# Patient Record
Sex: Male | Born: 1965 | Race: Black or African American | Hispanic: No | Marital: Single | Smoking: Former smoker
Health system: Southern US, Community
[De-identification: ages and names within clinical notes are randomized; demographics above are authoritative.]

## PROBLEM LIST (undated history)

## (undated) DIAGNOSIS — Y249XXA Unspecified firearm discharge, undetermined intent, initial encounter: Secondary | ICD-10-CM

## (undated) DIAGNOSIS — W3400XA Accidental discharge from unspecified firearms or gun, initial encounter: Secondary | ICD-10-CM

## (undated) DIAGNOSIS — M1990 Unspecified osteoarthritis, unspecified site: Secondary | ICD-10-CM

## (undated) HISTORY — DX: Unspecified osteoarthritis, unspecified site: M19.90

## (undated) HISTORY — PX: FEMUR FRACTURE SURGERY: SHX633

## (undated) HISTORY — PX: OTHER SURGICAL HISTORY: SHX169

---

## 2008-06-09 ENCOUNTER — Emergency Department (HOSPITAL_COMMUNITY): Admission: EM | Admit: 2008-06-09 | Discharge: 2008-06-09 | Payer: Self-pay | Admitting: Family Medicine

## 2008-09-01 ENCOUNTER — Emergency Department (HOSPITAL_COMMUNITY): Admission: EM | Admit: 2008-09-01 | Discharge: 2008-09-01 | Payer: Self-pay | Admitting: Family Medicine

## 2008-12-03 ENCOUNTER — Emergency Department (HOSPITAL_COMMUNITY): Admission: EM | Admit: 2008-12-03 | Discharge: 2008-12-03 | Payer: Self-pay | Admitting: Emergency Medicine

## 2008-12-16 ENCOUNTER — Emergency Department (HOSPITAL_COMMUNITY): Admission: EM | Admit: 2008-12-16 | Discharge: 2008-12-16 | Payer: Self-pay | Admitting: Emergency Medicine

## 2009-02-03 ENCOUNTER — Emergency Department (HOSPITAL_COMMUNITY): Admission: EM | Admit: 2009-02-03 | Discharge: 2009-02-03 | Payer: Self-pay | Admitting: Family Medicine

## 2009-08-12 ENCOUNTER — Emergency Department (HOSPITAL_COMMUNITY): Admission: EM | Admit: 2009-08-12 | Discharge: 2009-08-12 | Payer: Self-pay | Admitting: Emergency Medicine

## 2009-11-30 ENCOUNTER — Emergency Department (HOSPITAL_COMMUNITY)
Admission: EM | Admit: 2009-11-30 | Discharge: 2009-11-30 | Payer: Self-pay | Source: Home / Self Care | Admitting: Family Medicine

## 2010-02-11 ENCOUNTER — Emergency Department (HOSPITAL_COMMUNITY)
Admission: EM | Admit: 2010-02-11 | Discharge: 2010-02-12 | Payer: Self-pay | Source: Home / Self Care | Admitting: Emergency Medicine

## 2010-04-16 ENCOUNTER — Emergency Department (HOSPITAL_COMMUNITY)
Admission: EM | Admit: 2010-04-16 | Discharge: 2010-04-16 | Disposition: A | Discharge status: Home / Self Care | Payer: 59 | Attending: Emergency Medicine | Admitting: Emergency Medicine

## 2010-04-16 DIAGNOSIS — R51 Headache: Secondary | ICD-10-CM | POA: Insufficient documentation

## 2010-04-16 DIAGNOSIS — R110 Nausea: Secondary | ICD-10-CM | POA: Insufficient documentation

## 2010-04-21 ENCOUNTER — Emergency Department (HOSPITAL_COMMUNITY): Payer: 59

## 2010-04-21 ENCOUNTER — Emergency Department (HOSPITAL_COMMUNITY)
Admission: EM | Admit: 2010-04-21 | Discharge: 2010-04-21 | Disposition: A | Discharge status: Home / Self Care | Payer: 59 | Attending: Emergency Medicine | Admitting: Emergency Medicine

## 2010-04-21 DIAGNOSIS — G43909 Migraine, unspecified, not intractable, without status migrainosus: Secondary | ICD-10-CM | POA: Insufficient documentation

## 2010-04-21 DIAGNOSIS — H53149 Visual discomfort, unspecified: Secondary | ICD-10-CM | POA: Insufficient documentation

## 2010-04-21 DIAGNOSIS — H5710 Ocular pain, unspecified eye: Secondary | ICD-10-CM | POA: Insufficient documentation

## 2010-04-21 DIAGNOSIS — R112 Nausea with vomiting, unspecified: Secondary | ICD-10-CM | POA: Insufficient documentation

## 2010-05-07 LAB — POCT I-STAT, CHEM 8
BUN: 11 mg/dL (ref 6–23)
Creatinine, Ser: 1.2 mg/dL (ref 0.4–1.5)
Hemoglobin: 13.9 g/dL (ref 13.0–17.0)
Sodium: 142 mEq/L (ref 135–145)
TCO2: 31 mmol/L (ref 0–100)

## 2010-05-07 LAB — GC/CHLAMYDIA PROBE AMP, GENITAL: Chlamydia, DNA Probe: NEGATIVE

## 2010-05-07 LAB — URINALYSIS, ROUTINE W REFLEX MICROSCOPIC
Bilirubin Urine: NEGATIVE
Ketones, ur: NEGATIVE mg/dL
Nitrite: NEGATIVE
pH: 6.5 (ref 5.0–8.0)

## 2010-05-07 LAB — URINE MICROSCOPIC-ADD ON

## 2010-05-10 LAB — GC/CHLAMYDIA PROBE AMP, GENITAL: Chlamydia, DNA Probe: POSITIVE — AB

## 2010-05-13 LAB — POCT URINALYSIS DIP (DEVICE)
Bilirubin Urine: NEGATIVE
Glucose, UA: NEGATIVE mg/dL
Ketones, ur: NEGATIVE mg/dL
Protein, ur: NEGATIVE mg/dL
Specific Gravity, Urine: 1.025 (ref 1.005–1.030)
Urobilinogen, UA: 0.2 mg/dL (ref 0.0–1.0)

## 2010-05-13 LAB — GC/CHLAMYDIA PROBE AMP, GENITAL
Chlamydia, DNA Probe: NEGATIVE
GC Probe Amp, Genital: NEGATIVE

## 2010-05-13 LAB — RPR: RPR Ser Ql: NONREACTIVE

## 2010-05-29 LAB — POCT URINALYSIS DIP (DEVICE)
Bilirubin Urine: NEGATIVE
Glucose, UA: NEGATIVE mg/dL
Ketones, ur: NEGATIVE mg/dL
Specific Gravity, Urine: 1.02 (ref 1.005–1.030)

## 2010-05-29 LAB — RPR: RPR Ser Ql: NONREACTIVE

## 2010-05-31 LAB — URINE MICROSCOPIC-ADD ON

## 2010-05-31 LAB — URINALYSIS, ROUTINE W REFLEX MICROSCOPIC
Bilirubin Urine: NEGATIVE
Glucose, UA: NEGATIVE mg/dL
Ketones, ur: NEGATIVE mg/dL
Nitrite: NEGATIVE
Protein, ur: NEGATIVE mg/dL
Specific Gravity, Urine: 1.025 (ref 1.005–1.030)

## 2010-05-31 LAB — URINE CULTURE
Colony Count: NO GROWTH
Culture: NO GROWTH

## 2010-06-06 LAB — POCT URINALYSIS DIP (DEVICE)
Bilirubin Urine: NEGATIVE
Glucose, UA: NEGATIVE mg/dL
Urobilinogen, UA: 0.2 mg/dL (ref 0.0–1.0)
pH: 6 (ref 5.0–8.0)

## 2010-06-06 LAB — GC/CHLAMYDIA PROBE AMP, GENITAL: GC Probe Amp, Genital: NEGATIVE

## 2010-10-08 ENCOUNTER — Inpatient Hospital Stay (INDEPENDENT_AMBULATORY_CARE_PROVIDER_SITE_OTHER)
Admission: RE | Admit: 2010-10-08 | Discharge: 2010-10-08 | Disposition: A | Discharge status: Home / Self Care | Payer: Self-pay | Source: Ambulatory Visit | Attending: Family Medicine | Admitting: Family Medicine

## 2010-10-08 DIAGNOSIS — N342 Other urethritis: Secondary | ICD-10-CM

## 2010-10-09 LAB — GC/CHLAMYDIA PROBE AMP, GENITAL: Chlamydia, DNA Probe: NEGATIVE

## 2011-01-04 ENCOUNTER — Emergency Department (HOSPITAL_COMMUNITY)
Admission: EM | Admit: 2011-01-04 | Discharge: 2011-01-04 | Disposition: A | Discharge status: Home / Self Care | Payer: Self-pay | Attending: Emergency Medicine | Admitting: Emergency Medicine

## 2011-01-04 ENCOUNTER — Emergency Department (HOSPITAL_COMMUNITY): Payer: Self-pay

## 2011-01-04 ENCOUNTER — Encounter: Payer: Self-pay | Admitting: *Deleted

## 2011-01-04 DIAGNOSIS — S91009A Unspecified open wound, unspecified ankle, initial encounter: Secondary | ICD-10-CM | POA: Insufficient documentation

## 2011-01-04 DIAGNOSIS — S91019A Laceration without foreign body, unspecified ankle, initial encounter: Secondary | ICD-10-CM

## 2011-01-04 DIAGNOSIS — M25473 Effusion, unspecified ankle: Secondary | ICD-10-CM | POA: Insufficient documentation

## 2011-01-04 DIAGNOSIS — S81009A Unspecified open wound, unspecified knee, initial encounter: Secondary | ICD-10-CM | POA: Insufficient documentation

## 2011-01-04 DIAGNOSIS — R209 Unspecified disturbances of skin sensation: Secondary | ICD-10-CM | POA: Insufficient documentation

## 2011-01-04 DIAGNOSIS — S93409A Sprain of unspecified ligament of unspecified ankle, initial encounter: Secondary | ICD-10-CM | POA: Insufficient documentation

## 2011-01-04 DIAGNOSIS — M25579 Pain in unspecified ankle and joints of unspecified foot: Secondary | ICD-10-CM | POA: Insufficient documentation

## 2011-01-04 DIAGNOSIS — M25476 Effusion, unspecified foot: Secondary | ICD-10-CM | POA: Insufficient documentation

## 2011-01-04 DIAGNOSIS — IMO0002 Reserved for concepts with insufficient information to code with codable children: Secondary | ICD-10-CM | POA: Insufficient documentation

## 2011-01-04 MED ORDER — HYDROCODONE-ACETAMINOPHEN 5-325 MG PO TABS: 2.0000 | ORAL_TABLET | ORAL | Status: AC | PRN
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
## 2011-01-05 NOTE — ED Provider Notes (Signed)
Medical screening examination/treatment/procedure(s) were performed by non-physician practitioner and as supervising physician I was immediately available for consultation/collaboration.  Lecil Tapp L Allien Melberg, MD 01/05/11 0207 

## 2011-02-27 ENCOUNTER — Emergency Department (HOSPITAL_COMMUNITY)
Admission: EM | Admit: 2011-02-27 | Discharge: 2011-02-27 | Disposition: A | Discharge status: Home / Self Care | Payer: Self-pay | Attending: Emergency Medicine | Admitting: Emergency Medicine

## 2011-02-27 ENCOUNTER — Encounter (HOSPITAL_COMMUNITY): Payer: Self-pay | Admitting: *Deleted

## 2011-02-27 ENCOUNTER — Telehealth (HOSPITAL_COMMUNITY): Payer: Self-pay | Admitting: *Deleted

## 2011-02-27 DIAGNOSIS — R51 Headache: Secondary | ICD-10-CM | POA: Insufficient documentation

## 2011-02-27 DIAGNOSIS — R369 Urethral discharge, unspecified: Secondary | ICD-10-CM | POA: Insufficient documentation

## 2011-02-27 DIAGNOSIS — N342 Other urethritis: Secondary | ICD-10-CM | POA: Insufficient documentation

## 2011-02-27 MED ORDER — DOXYCYCLINE HYCLATE 100 MG PO TABS: 100.0000 mg | ORAL_TABLET | Freq: Once | ORAL | Status: DC

## 2011-02-27 MED ORDER — KETOROLAC TROMETHAMINE 60 MG/2ML IM SOLN
60.0000 mg | Freq: Once | INTRAMUSCULAR | Status: AC
  Administered 2011-02-27: 60 mg via INTRAMUSCULAR
  Filled 2011-02-27: qty 2

## 2011-02-27 MED ORDER — DOXYCYCLINE HYCLATE 100 MG PO CAPS: 100.0000 mg | ORAL_CAPSULE | Freq: Two times a day (BID) | ORAL | Status: AC

## 2011-02-27 MED ORDER — OXYCODONE-ACETAMINOPHEN 5-325 MG PO TABS: 2.0000 | ORAL_TABLET | ORAL | Status: DC | PRN

## 2011-02-27 MED ORDER — CEFTRIAXONE SODIUM 250 MG IJ SOLR: 250.0000 mg | Freq: Once | INTRAMUSCULAR | Status: DC

## 2011-02-27 NOTE — ED Notes (Addendum)
Rocephin and Doxycycline not given at this time due to early patient discharge.  Flow manager called to have pt return for rocephin injection asap.
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
## 2011-02-27 NOTE — ED Notes (Signed)
Per flow, Pt called to return for injection.  Pt phone number is not in service

## 2011-02-27 NOTE — ED Notes (Signed)
Patient complains of migraine headache since Saturday and penile discharge with burning upon urination since Sunday.

## 2011-02-27 NOTE — ED Notes (Signed)
GC/chlamydia sample sent to lab

## 2011-02-27 NOTE — ED Notes (Signed)
Pt called in triage waiting area as well as main ED waiting area with no response

## 2011-02-28 LAB — GC/CHLAMYDIA PROBE AMP, GENITAL
Chlamydia, DNA Probe: NEGATIVE
GC Probe Amp, Genital: NEGATIVE

## 2011-03-05 ENCOUNTER — Telehealth (HOSPITAL_COMMUNITY): Payer: Self-pay | Admitting: *Deleted

## 2011-03-06 ENCOUNTER — Telehealth (HOSPITAL_COMMUNITY): Payer: Self-pay | Admitting: *Deleted

## 2011-03-06 NOTE — ED Notes (Signed)
1300  Pt. called and requested his STD testing from 2010 to the present.  I told pt. I would call back when I had them ready. He will need to fill out a medical release form and have a picture ID we can copy. Pt. Voiced understanding. Labs copied and put in a brown envelope at the front desk. Registration instructed to copy pt.'s ID and get him to fill out the medical release form. Vassie Moselle 03/06/2011

## 2011-05-23 ENCOUNTER — Emergency Department (INDEPENDENT_AMBULATORY_CARE_PROVIDER_SITE_OTHER)
Admission: EM | Admit: 2011-05-23 | Discharge: 2011-05-23 | Disposition: A | Discharge status: Home / Self Care | Payer: Self-pay | Source: Home / Self Care | Attending: Emergency Medicine | Admitting: Emergency Medicine

## 2011-05-23 ENCOUNTER — Encounter (HOSPITAL_COMMUNITY): Payer: Self-pay

## 2011-05-23 DIAGNOSIS — R369 Urethral discharge, unspecified: Secondary | ICD-10-CM

## 2011-05-23 MED ORDER — DOXYCYCLINE HYCLATE 100 MG PO CAPS: 100.0000 mg | ORAL_CAPSULE | Freq: Two times a day (BID) | ORAL | Status: AC
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
## 2011-05-23 NOTE — Discharge Instructions (Signed)
As discussed you have had several visits related to a ongoing and recurrent urethral discharge. Have reviewed your previous studies in today we have again screen you for STDs. As your test results continued to come back negative have encouraged you to consult a urologist if your discharge were to persist. Have also encouraged you to stop pushing your penis or pressing penis as that could generate discharge

## 2011-05-23 NOTE — ED Notes (Signed)
Pt c/o penile discharge for 1 month- states went to ED for same and was diagnosed with UTI.  Reports he took the antibiotic, sx resolved and then came back.

## 2011-05-24 LAB — GC/CHLAMYDIA PROBE AMP, GENITAL
Chlamydia, DNA Probe: NEGATIVE
GC Probe Amp, Genital: NEGATIVE

## 2011-05-29 ENCOUNTER — Telehealth (HOSPITAL_COMMUNITY): Payer: Self-pay | Admitting: *Deleted

## 2011-05-29 NOTE — ED Notes (Signed)
Discussed with Dr. Juanetta Gosling and he said the only other medicine that will treat non-gonococcal urethritis is Zithromax. He gave order for Z-pack #6, 2 tabs po the first day and 1 tab po QD for the next 4 days. I called pt. back and gave him this information. I told him it would be around $45.00. He said he could afford that and wants Rx. called to [facility] on [address]. Rx. called to the pharmacist @ [phone].Vassie Moselle 05/29/2011

## 2011-05-29 NOTE — ED Notes (Signed)
Pt. called for his lab results. Pt. verified x 2 and given neg. GC/Chlamydia result. He said the doctor gave him a Rx. of Doxycycline for possible UTI but did not fill it because it is $90.00. I asked if he still has sx.'s and he said yes he has a burning and "sting." He said this medicine used to be cheap and now it is expensive. I explained the price went up after the national back order of the drug. I told him, Dr. Ladon Applebaum is not here today and  I will have to talk to one of the other physicians and call him back. Vassie Moselle 05/29/2011

## 2011-09-11 ENCOUNTER — Telehealth: Payer: Self-pay | Admitting: Medical Oncology

## 2011-09-11 NOTE — Telephone Encounter (Signed)
wrong chart

## 2012-08-01 ENCOUNTER — Emergency Department (HOSPITAL_COMMUNITY)
Admission: EM | Admit: 2012-08-01 | Discharge: 2012-08-01 | Disposition: A | Discharge status: Home / Self Care | Payer: Self-pay | Attending: Emergency Medicine | Admitting: Emergency Medicine

## 2012-08-01 ENCOUNTER — Encounter (HOSPITAL_COMMUNITY): Payer: Self-pay | Admitting: Physical Medicine and Rehabilitation

## 2012-08-01 DIAGNOSIS — Z87828 Personal history of other (healed) physical injury and trauma: Secondary | ICD-10-CM | POA: Insufficient documentation

## 2012-08-01 DIAGNOSIS — M5412 Radiculopathy, cervical region: Secondary | ICD-10-CM | POA: Insufficient documentation

## 2012-08-01 DIAGNOSIS — F17200 Nicotine dependence, unspecified, uncomplicated: Secondary | ICD-10-CM | POA: Insufficient documentation

## 2012-08-01 DIAGNOSIS — Z8669 Personal history of other diseases of the nervous system and sense organs: Secondary | ICD-10-CM | POA: Insufficient documentation

## 2012-08-01 DIAGNOSIS — Z79899 Other long term (current) drug therapy: Secondary | ICD-10-CM | POA: Insufficient documentation

## 2012-08-01 MED ORDER — NAPROXEN 500 MG PO TABS: 500.0000 mg | ORAL_TABLET | Freq: Two times a day (BID) | ORAL | Status: DC

## 2012-08-01 MED ORDER — HYDROCODONE-ACETAMINOPHEN 5-325 MG PO TABS: 1.0000 | ORAL_TABLET | ORAL | Status: DC | PRN

## 2012-08-01 MED ORDER — METHOCARBAMOL 500 MG PO TABS: 500.0000 mg | ORAL_TABLET | Freq: Two times a day (BID) | ORAL | Status: DC

## 2012-08-01 NOTE — ED Notes (Signed)
Pt presents to department for evaluation of neck pain radiating down R arm. States numbness/tingling to R arm. 10/10 pain at the time, increases with movement. Pt is alert and oriented x4. Ambulatory to exam room.

## 2012-08-01 NOTE — ED Provider Notes (Signed)
Medical screening examination/treatment/procedure(s) were performed by non-physician practitioner and as supervising physician I was immediately available for consultation/collaboration.    Kiah Keay D Blomberg, MD 08/01/12 1721 
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
## 2012-09-24 ENCOUNTER — Encounter (HOSPITAL_COMMUNITY): Payer: Self-pay | Admitting: Emergency Medicine

## 2012-09-24 ENCOUNTER — Emergency Department (HOSPITAL_COMMUNITY)
Admission: EM | Admit: 2012-09-24 | Discharge: 2012-09-24 | Disposition: A | Discharge status: Home / Self Care | Payer: Self-pay | Attending: Emergency Medicine | Admitting: Emergency Medicine

## 2012-09-24 DIAGNOSIS — G43909 Migraine, unspecified, not intractable, without status migrainosus: Secondary | ICD-10-CM

## 2012-09-24 DIAGNOSIS — Z87891 Personal history of nicotine dependence: Secondary | ICD-10-CM | POA: Insufficient documentation

## 2012-09-24 DIAGNOSIS — H53149 Visual discomfort, unspecified: Secondary | ICD-10-CM | POA: Insufficient documentation

## 2012-09-24 DIAGNOSIS — J329 Chronic sinusitis, unspecified: Secondary | ICD-10-CM | POA: Insufficient documentation

## 2012-09-24 MED ORDER — SODIUM CHLORIDE 0.9 % IV BOLUS (SEPSIS)
1000.0000 mL | Freq: Once | INTRAVENOUS | Status: AC
  Administered 2012-09-24: 1000 mL via INTRAVENOUS

## 2012-09-24 MED ORDER — DIPHENHYDRAMINE HCL 50 MG/ML IJ SOLN
25.0000 mg | Freq: Once | INTRAMUSCULAR | Status: AC
  Administered 2012-09-24: 25 mg via INTRAVENOUS
  Filled 2012-09-24: qty 1

## 2012-09-24 MED ORDER — KETOROLAC TROMETHAMINE 30 MG/ML IJ SOLN
30.0000 mg | Freq: Once | INTRAMUSCULAR | Status: AC
  Administered 2012-09-24: 30 mg via INTRAVENOUS
  Filled 2012-09-24: qty 1

## 2012-09-24 MED ORDER — FLUTICASONE PROPIONATE 50 MCG/ACT NA SUSP
1.0000 | Freq: Every day | NASAL | Status: DC
  Administered 2012-09-24: 1 via NASAL
  Filled 2012-09-24 (×2): qty 16

## 2012-09-24 MED ORDER — METOCLOPRAMIDE HCL 5 MG/ML IJ SOLN
10.0000 mg | Freq: Once | INTRAMUSCULAR | Status: AC
  Administered 2012-09-24: 10 mg via INTRAVENOUS
  Filled 2012-09-24: qty 2

## 2012-09-24 NOTE — ED Provider Notes (Signed)
Medical screening examination/treatment/procedure(s) were performed by non-physician practitioner and as supervising physician I was immediately available for consultation/collaboration.  Ethelda Chick, MD 09/24/12 [phone]

## 2012-09-24 NOTE — ED Notes (Signed)
Pt c/o left sided headache ongoing x 1 month. Pt has history of migraines, last one was  2 years. Pt reports nausea and vomiting but not today. Pt reports dizziness and blurred vision in left eye.
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
## 2012-09-24 NOTE — Progress Notes (Signed)
Met Patient at bedside,case manager role explained-patient verbalizes understanding.Patient reports increased headache as reason for  today's visit. Patient educated on using all available Resources.Patient provided with resource information for the [facility]  center on [address] patient does not  Have a PCP.Patient reports he  Understands today's  education. No further case management needs identified.

## 2013-04-06 ENCOUNTER — Encounter (HOSPITAL_COMMUNITY): Payer: Self-pay | Admitting: Emergency Medicine

## 2013-04-06 ENCOUNTER — Emergency Department (HOSPITAL_COMMUNITY)
Admission: EM | Admit: 2013-04-06 | Discharge: 2013-04-06 | Disposition: A | Discharge status: Home / Self Care | Payer: Self-pay | Attending: Emergency Medicine | Admitting: Emergency Medicine

## 2013-04-06 DIAGNOSIS — Z8679 Personal history of other diseases of the circulatory system: Secondary | ICD-10-CM | POA: Insufficient documentation

## 2013-04-06 DIAGNOSIS — Z87891 Personal history of nicotine dependence: Secondary | ICD-10-CM | POA: Insufficient documentation

## 2013-04-06 DIAGNOSIS — R51 Headache: Secondary | ICD-10-CM | POA: Insufficient documentation

## 2013-04-06 DIAGNOSIS — R519 Headache, unspecified: Secondary | ICD-10-CM

## 2013-04-06 MED ORDER — METOCLOPRAMIDE HCL 5 MG/ML IJ SOLN
10.0000 mg | Freq: Once | INTRAMUSCULAR | Status: AC
  Administered 2013-04-06: 10 mg via INTRAVENOUS
  Filled 2013-04-06: qty 2

## 2013-04-06 MED ORDER — SODIUM CHLORIDE 0.9 % IV BOLUS (SEPSIS)
1000.0000 mL | Freq: Once | INTRAVENOUS | Status: AC
  Administered 2013-04-06: 1000 mL via INTRAVENOUS

## 2013-04-06 MED ORDER — KETOROLAC TROMETHAMINE 30 MG/ML IJ SOLN
30.0000 mg | Freq: Once | INTRAMUSCULAR | Status: AC
  Administered 2013-04-06: 30 mg via INTRAVENOUS
  Filled 2013-04-06: qty 1

## 2013-04-06 MED ORDER — DIPHENHYDRAMINE HCL 50 MG/ML IJ SOLN
25.0000 mg | Freq: Once | INTRAMUSCULAR | Status: AC
  Administered 2013-04-06: 25 mg via INTRAVENOUS
  Filled 2013-04-06: qty 1

## 2013-04-06 MED ORDER — FLUTICASONE PROPIONATE 50 MCG/ACT NA SUSP: 2.0000 | Freq: Every day | NASAL | Status: DC

## 2013-04-06 NOTE — Discharge Instructions (Signed)
Drink plenty of fluids and rest  Return to the emergency department if you develop any changing/worsening condition, fever, slurred speech, difficulty walking, weakness, confusion, loss of sensation, vision changes, stiff neck, or any other concerns (please read additional information regarding your condition below)     Migraine Headache A migraine headache is an intense, throbbing pain on one or both sides of your head. A migraine can last for 30 minutes to several hours. CAUSES  The exact cause of a migraine headache is not always known. However, a migraine may be caused when nerves in the brain become irritated and release chemicals that cause inflammation. This causes pain. Certain things may also trigger migraines, such as:  Alcohol.  Smoking.  Stress.  Menstruation.  Aged cheeses.  Foods or drinks that contain nitrates, glutamate, aspartame, or tyramine.  Lack of sleep.  Chocolate.  Caffeine.  Hunger.  Physical exertion.  Fatigue.  Medicines used to treat chest pain (nitroglycerine), birth control pills, estrogen, and some blood pressure medicines. SIGNS AND SYMPTOMS  Pain on one or both sides of your head.  Pulsating or throbbing pain.  Severe pain that prevents daily activities.  Pain that is aggravated by any physical activity.  Nausea, vomiting, or both.  Dizziness.  Pain with exposure to bright lights, loud noises, or activity.  General sensitivity to bright lights, loud noises, or smells. Before you get a migraine, you may get warning signs that a migraine is coming (aura). An aura may include:  Seeing flashing lights.  Seeing bright spots, halos, or zig-zag lines.  Having tunnel vision or blurred vision.  Having feelings of numbness or tingling.  Having trouble talking.  Having muscle weakness. DIAGNOSIS  A migraine headache is often diagnosed based on:  Symptoms.  Physical exam.  A CT scan or MRI of your head. These imaging tests  cannot diagnose migraines, but they can help rule out other causes of headaches. TREATMENT Medicines may be given for pain and nausea. Medicines can also be given to help prevent recurrent migraines.  HOME CARE INSTRUCTIONS  Only take over-the-counter or prescription medicines for pain or discomfort as directed by your health care provider. The use of long-term narcotics is not recommended.  Lie down in a dark, quiet room when you have a migraine.  Keep a journal to find out what may trigger your migraine headaches. For example, write down:  What you eat and drink.  How much sleep you get.  Any change to your diet or medicines.  Limit alcohol consumption.  Quit smoking if you smoke.  Get 7 9 hours of sleep, or as recommended by your health care provider.  Limit stress.  Keep lights dim if bright lights bother you and make your migraines worse. SEEK IMMEDIATE MEDICAL CARE IF:   Your migraine becomes severe.  You have a fever.  You have a stiff neck.  You have vision loss.  You have muscular weakness or loss of muscle control.  You start losing your balance or have trouble walking.  You feel faint or pass out.  You have severe symptoms that are different from your first symptoms. MAKE SURE YOU:   Understand these instructions.  Will watch your condition.  Will get help right away if you are not doing well or get worse. Document Released: 02/11/2005 Document Revised: 12/02/2012 Document Reviewed: 10/19/2012 [facility] Patient Information 2014 [place], [place].   Emergency Department Resource Guide 1) Find a Doctor and Pay Out of Pocket Although you won't have to  find out who is covered by your insurance plan, it is a good idea to ask around and get recommendations. You will then need to call the office and see if the doctor you have chosen will accept you as a new patient and what types of options they offer for patients who are self-pay. Some doctors offer discounts  or will set up payment plans for their patients who do not have insurance, but you will need to ask so you aren't surprised when you get to your appointment.  2) Contact Your Local Health Department Not all health departments have doctors that can see patients for sick visits, but many do, so it is worth a call to see if yours does. If you don't know where your local health department is, you can check in your phone book. The CDC also has a tool to help you locate your state's health department, and many state websites also have listings of all of their local health departments.  3) Find a Walk-in Clinic If your illness is not likely to be very severe or complicated, you may want to try a walk in clinic. These are popping up all over the country in pharmacies, drugstores, and shopping centers. They're usually staffed by nurse practitioners or physician assistants that have been trained to treat common illnesses and complaints. They're usually fairly quick and inexpensive. However, if you have serious medical issues or chronic medical problems, these are probably not your best option.  No Primary Care Doctor: - Call Health Connect at  [phone] - they can help you locate a primary care doctor that  accepts your insurance, provides certain services, etc. - Physician Referral Service- [phone]  Chronic Pain Problems: Organization         Address  Phone   Notes  [facility]  [phone] Patients need to be referred by their primary care doctor.   Medication Assistance: Organization         Address  Phone   Notes  [facility] Medication [facility] [address] [place] [place], [place] [address] [phone] --Must be a resident of [facility] -- Must have NO insurance coverage whatsoever (no Medicaid/ Medicare, etc.) -- The pt. MUST have a primary care doctor that directs their care regularly and follows them in the community   [facility]  [phone]   [employer]  [phone]    Agencies that provide inexpensive medical care: Organization         Address  Phone   Notes  [facility]  [phone]   [facility]    [phone]   [facility] [address] [place], [place] [address] [phone]   [facility] [address] [place], [place] [phone]   Planned Parenthood    [phone]   [facility]    [phone]   [facility]  [address], [place] Phone:  [phone], Fax:  [phone] Hours of Operation:  9 am - 6 pm, M-F.  Also accepts Medicaid/Medicare and self-pay.  [facility]  [address], [place] Phone: [phone], Fax: [phone]. Hours of Operation:  8:30 am - 5:30 pm, M-F.  Also accepts Medicaid and self-pay.  [facility] [place] [address], [employer] Phone: [phone]   [facility] [address] [place], [place], [place] [phone], Ext. [phone] Mondays & Thursdays: 7-9 AM.  First 15 patients are seen on a first come, first serve basis. °  ° °Medicaid-accepting [place] Providers: ° °Organization         Address  Phone   Notes  °[facility] [address], [place] [phone] Also accepts self-pay patients.  °[facility] [address], [place] ° ([phone]) [phone]   °[facility] [address], [place] [phone]) [phone]   °[facility] [address] [place] [address], [place] [phone]) [phone]   °Veita Bland [address], [place]  ° [phone] Only accepts [place] Access Medicaid patients after they have their name applied to their card.  ° °Self-Pay (no insurance) in [place]: ° °Organization         Address  Phone   Notes  °Sickle Cell Patients, [facility] [address], [place] [phone]   °[facility] [address], [place] [phone]) [phone]   °[facility] ° [address], [place] [phone]   °[facility]/Dr. Osei-Bonsu ° [address] [place] [address], [place] or [address], [place] [phone] Phone number for both [place] and [place] locations is the same.  °[facility] [address], [place] [phone]) [phone]   °[facility] [address] [place] [address], [place] or [address] [phone]) [phone] °([phone]) [phone]   °[facility] [address], [place] [phone]) [phone], phone; [phone], fax Sees patients 1st and 3rd Saturday of every month.  Must not qualify for public or private insurance (i.e. Medicaid, Medicare, [place] Health Choice, Veterans' Benefits) • Household income should be no more than 200% of the poverty level •The clinic cannot treat you if you are pregnant or think you are pregnant • Sexually transmitted diseases are not treated at the clinic.  ° ° °Dental Care: °Organization         Address  Phone  Notes  °[place] [facility] [address], [place] [phone]) [phone] Accepts children up to age 21 who are enrolled in Medicaid or [place] Health Choice; pregnant women with a Medicaid card; and children who have applied for Medicaid or [place] Health Choice, but were declined, whose parents can pay a reduced fee at time of service.  °[place] [facility] [place]  [address], [place] [phone]) [phone] Accepts children up to age 21 who are enrolled in Medicaid or [place] Health Choice; pregnant women with a Medicaid card; and children who have applied for Medicaid or  Health Choice, but were declined, whose parents can pay a reduced fee at time of service.  °[facility] ° [address],  ([phone])  [phone] Patients are seen by appointment only. Walk-ins are not accepted. [facility] will see patients 18 years of age and older. °Monday - Tuesday (8am-5pm) °Most Wednesdays (8:30-5pm) °$30 per visit, cash only  °[facility] ° [address], [place] [phone] Patients are seen by appointment only. Walk-ins are not accepted. [facility] will see patients 18 years of age and older. °One Wednesday Evening (Monthly: Volunteer Based).  $30 per visit, cash only  °[facility]  (  [phone] for adults; Children under age 81, call Graduate Pediatric Dentistry at [phone]. Children aged 13-14, please call [phone] to request a pediatric application.  Dental services are provided in all areas of dental care including fillings, crowns and bridges, complete and partial dentures, implants, gum treatment, root canals, and extractions. Preventive care is also provided. Treatment is provided to both adults and children. Patients are selected via a lottery and there is often a waiting list.   [facility] [address] [place]  [phone] [email or web address]   [facility] [address] [place], [place] [phone], Ext. [phone] Second and Fourth Thursday of each month, opens at 6:30 AM; Clinic ends at 9 AM.  Patients are seen on a first-come first-served basis, and a limited number are seen during each clinic.   [facility]  [address] Hillard Danker [place], [place] [phone]   Eligibility Requirements You must have lived in [place], [place], or [place] counties for at least the last three months.   You cannot be eligible for state or federal sponsored [employer], including [employer], [place], or [employer].   You generally cannot be eligible for healthcare insurance through your employer.    How to apply: Eligibility screenings are held every Tuesday and Wednesday afternoon  from 1:00 pm until 4:00 pm. You do not need an appointment for the interview!  [facility] [address], [place], [place]   [place]  [place] Department  [place]  [phone]    Behavioral Health Resources in the Community: Intensive Outpatient Programs Organization         Address  Phone  Notes  [place] [place]. [address] [place], [place] [phone]   [facility] [address] [place], [place]   [facility] [address] [place], [place]   [place] [address],  [place], [place] or [phone]   Substance Abuse Resources Organization         Address  Phone  Notes  [facility]  [phone]   [place]  [phone]   [facility]   Chinita Pester  [phone]   Residential & Outpatient Substance Abuse Program  [phone]   Psychological Services Organization         Address  Phone  Notes  [facility] [place]  [place]  [phone]   [place] [address], [place] or [phone]    Mobile Crisis Teams Organization         Address  Phone  Notes  [facility], Mobile Crisis Care Unit  [phone]   [facility]  [address] [place], [place]   [place] [address], [place] [phone]    Self-Help/Support Groups Organization         Address  Phone             Notes  [place]. of [place] - variety of support groups  [place] Call for more information  Narcotics Anonymous (NA), [facility] [address], [employer] [place]  2 meetings at this location   Cytogeneticist  Notes  [facility] [address],    [place]  [phone]   [facility]  [address], [place] [phone], [place], [place] [phone]   [facility] [address], [place] [phone] Admissions: 8am-3pm M-F  [facility] [address]    [place], [place] [phone]   [facility] [address], [place], [place] [phone]   [facility] [address]  [place], [place] [phone]   [facility] - Intensive Outpatient [address], [place], [place] [phone]   [facility] [address]  [place], [place] [phone] or [phone]   [facility] ([facility]) [address] [place], [place] [phone] Accepts Medicaid  [facility] [address]  [place] [phone] Substance Abuse/Addiction Treatment   [facility]al [facility] Resources Organization         Address  Phone  Notes  [facility]  [phone](888) [phone]   Angie FavaJulie Brannon, PhD [address] [place], [place]   [phone](336) [phone] or [phone](336) [phone]   [facility] [place]   [address] [place], [place] [phone](336) [phone]   [facility] [address], [place], [place] [phone]) [phone] Insurance/Medicaid/sponsorship through [facility]  [facility] [address]                                    [place], [place] [phone]) [phone] Therapy/tele-psych/case  [facility] [address].   [place], [place] [phone]) [phone]    Dr. Lolly MustacheArfeen  [phone]   [facility]  [employer] [facility] 1) [address], [place] 2) [address], [place] 3)  [address], [place] [phone]) [phone]  [phone]) [phone]   [facility] Child Abuse Hotline [phone](336) [phone] or [phone](336) [phone] (After Hours)

## 2013-04-06 NOTE — ED Provider Notes (Signed)
Medical screening examination/treatment/procedure(s) were performed by non-physician practitioner and as supervising physician I was immediately available for consultation/collaboration.    Donnel Venuto R Damek Ende, MD 04/06/13 1434 

## 2013-04-06 NOTE — ED Notes (Signed)
Per pt sts migraine x 1 week and needs "cocktail". sts vision problems, nausea. sts pain in eyes. sts has been using BC and flonase without relief.
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
## 2013-04-21 ENCOUNTER — Emergency Department (HOSPITAL_COMMUNITY)
Admission: EM | Admit: 2013-04-21 | Discharge: 2013-04-21 | Disposition: A | Discharge status: Home / Self Care | Payer: Self-pay | Attending: Emergency Medicine | Admitting: Emergency Medicine

## 2013-04-21 ENCOUNTER — Encounter (HOSPITAL_COMMUNITY): Payer: Self-pay | Admitting: Emergency Medicine

## 2013-04-21 DIAGNOSIS — F17200 Nicotine dependence, unspecified, uncomplicated: Secondary | ICD-10-CM | POA: Insufficient documentation

## 2013-04-21 DIAGNOSIS — G43909 Migraine, unspecified, not intractable, without status migrainosus: Secondary | ICD-10-CM | POA: Insufficient documentation

## 2013-04-21 MED ORDER — KETOROLAC TROMETHAMINE 30 MG/ML IJ SOLN
30.0000 mg | Freq: Once | INTRAMUSCULAR | Status: AC
  Administered 2013-04-21: 30 mg via INTRAVENOUS
  Filled 2013-04-21: qty 1

## 2013-04-21 MED ORDER — SODIUM CHLORIDE 0.9 % IV BOLUS (SEPSIS)
1000.0000 mL | Freq: Once | INTRAVENOUS | Status: AC
  Administered 2013-04-21: 1000 mL via INTRAVENOUS

## 2013-04-21 MED ORDER — DEXAMETHASONE SODIUM PHOSPHATE 10 MG/ML IJ SOLN
10.0000 mg | Freq: Once | INTRAMUSCULAR | Status: AC
  Administered 2013-04-21: 10 mg via INTRAVENOUS
  Filled 2013-04-21: qty 1

## 2013-04-21 MED ORDER — METOCLOPRAMIDE HCL 5 MG/ML IJ SOLN
10.0000 mg | Freq: Once | INTRAMUSCULAR | Status: AC
  Administered 2013-04-21: 10 mg via INTRAVENOUS
  Filled 2013-04-21: qty 2

## 2013-04-21 MED ORDER — DIPHENHYDRAMINE HCL 50 MG/ML IJ SOLN
25.0000 mg | Freq: Once | INTRAMUSCULAR | Status: AC
  Administered 2013-04-21: 25 mg via INTRAVENOUS
  Filled 2013-04-21: qty 1

## 2013-04-21 NOTE — ED Notes (Signed)
Patient reports vomiting, diarrhea, and urinating "comes with the migraine."
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
## 2013-04-21 NOTE — Discharge Instructions (Signed)
Migraine Headache A migraine headache is an intense, throbbing pain on one or both sides of your head. A migraine can last for 30 minutes to several hours. CAUSES  The exact cause of a migraine headache is not always known. However, a migraine may be caused when nerves in the brain become irritated and release chemicals that cause inflammation. This causes pain. Certain things may also trigger migraines, such as:  Alcohol.  Smoking.  Stress.  Menstruation.  Aged cheeses.  Foods or drinks that contain nitrates, glutamate, aspartame, or tyramine.  Lack of sleep.  Chocolate.  Caffeine.  Hunger.  Physical exertion.  Fatigue.  Medicines used to treat chest pain (nitroglycerine), birth control pills, estrogen, and some blood pressure medicines. SIGNS AND SYMPTOMS  Pain on one or both sides of your head.  Pulsating or throbbing pain.  Severe pain that prevents daily activities.  Pain that is aggravated by any physical activity.  Nausea, vomiting, or both.  Dizziness.  Pain with exposure to bright lights, loud noises, or activity.  General sensitivity to bright lights, loud noises, or smells. Before you get a migraine, you may get warning signs that a migraine is coming (aura). An aura may include:  Seeing flashing lights.  Seeing bright spots, halos, or zig-zag lines.  Having tunnel vision or blurred vision.  Having feelings of numbness or tingling.  Having trouble talking.  Having muscle weakness. DIAGNOSIS  A migraine headache is often diagnosed based on:  Symptoms.  Physical exam.  A CT scan or MRI of your head. These imaging tests cannot diagnose migraines, but they can help rule out other causes of headaches. TREATMENT Medicines may be given for pain and nausea. Medicines can also be given to help prevent recurrent migraines.  HOME CARE INSTRUCTIONS  Only take over-the-counter or prescription medicines for pain or discomfort as directed by your  health care provider. The use of long-term narcotics is not recommended.  Lie down in a dark, quiet room when you have a migraine.  Keep a journal to find out what may trigger your migraine headaches. For example, write down:  What you eat and drink.  How much sleep you get.  Any change to your diet or medicines.  Limit alcohol consumption.  Quit smoking if you smoke.  Get 7 9 hours of sleep, or as recommended by your health care provider.  Limit stress.  Keep lights dim if bright lights bother you and make your migraines worse. SEEK IMMEDIATE MEDICAL CARE IF:   Your migraine becomes severe.  You have a fever.  You have a stiff neck.  You have vision loss.  You have muscular weakness or loss of muscle control.  You start losing your balance or have trouble walking.  You feel faint or pass out.  You have severe symptoms that are different from your first symptoms. MAKE SURE YOU:   Understand these instructions.  Will watch your condition.  Will get help right away if you are not doing well or get worse. Document Released: 02/11/2005 Document Revised: 12/02/2012 Document Reviewed: 10/19/2012 [facility] Patient Information 2014 [place], [place].  Recurrent Migraine Headache A migraine headache is very bad, throbbing pain on one or both sides of your head. Recurrent migraines keep coming back. Talk to your doctor about what things may bring on (trigger) your migraine headaches. HOME CARE  Only take medicines as told by your doctor.  Lie down in a dark, quiet room when you have a migraine.  Keep a journal to  find out if certain things bring on migraine headaches. For example, write down:  What you eat and drink.  How much sleep you get.  Any change to your diet or medicines.  Lessen how much alcohol you drink.  Quit smoking if you smoke.  Get enough sleep.  Lessen any stress in your life.  Keep lights dim if bright lights bother you or make your  migraines worse. GET HELP IF:  Medicine does not help your migraines.  Your pain keeps coming back. GET HELP RIGHT AWAY IF:   Your migraine becomes really bad.  You have a fever.  You have a stiff neck.  You have trouble seeing.  Your muscles are weak, or you lose muscle control.  You lose your balance or have trouble walking.  You feel like you will pass out (faint), or you pass out.  You have really bad symptoms that are different than your first symptoms. MAKE SURE YOU:   Understand these instructions.  Will watch your condition.  Will get help right away if you are not doing well or get worse. Document Released: 11/21/2007 Document Revised: 12/02/2012 Document Reviewed: 10/19/2012 [facility] Patient Information 2014 [place], [place].   Emergency Department Resource Guide 1) Find a Doctor and Pay Out of Pocket Although you won't have to find out who is covered by your insurance plan, it is a good idea to ask around and get recommendations. You will then need to call the office and see if the doctor you have chosen will accept you as a new patient and what types of options they offer for patients who are self-pay. Some doctors offer discounts or will set up payment plans for their patients who do not have insurance, but you will need to ask so you aren't surprised when you get to your appointment.  2) Contact Your Local Health Department Not all health departments have doctors that can see patients for sick visits, but many do, so it is worth a call to see if yours does. If you don't know where your local health department is, you can check in your phone book. The CDC also has a tool to help you locate your state's health department, and many state websites also have listings of all of their local health departments.  3) Find a Walk-in Clinic If your illness is not likely to be very severe or complicated, you may want to try a walk in clinic. These are popping up all over the  country in pharmacies, drugstores, and shopping centers. They're usually staffed by nurse practitioners or physician assistants that have been trained to treat common illnesses and complaints. They're usually fairly quick and inexpensive. However, if you have serious medical issues or chronic medical problems, these are probably not your best option.  No Primary Care Doctor: - Call Health Connect at  [phone] - they can help you locate a primary care doctor that  accepts your insurance, provides certain services, etc. - Physician Referral Service- [phone]  Chronic Pain Problems: Organization         Address  Phone   Notes  [facility]  [phone] Patients need to be referred by their primary care doctor.   Medication Assistance: Organization         Address  Phone   Notes  [facility] Medication [facility] Passistance Program [address] [place], [place] [address] [phone] --Must be a resident of [facility] -- Must have NO insurance coverage  whatsoever (no Medicaid/ Medicare, etc.) -- The pt. MUST have a primary care doctor that directs their care regularly and follows them in the community   MedAssist  [phone]   [employer]  [phone]    Agencies that provide inexpensive medical care: Organization         Address  Phone   Notes  [facility]  [phone]   [facility]    [phone]) [phone]   [facility] [address] [place], [place] [address] [phone]   [facility] [address], [place] [phone]) [phone]   Planned Parenthood    [phone]   [facility]    [phone]   [facility]  [address], [place] Phone:  [phone], Fax:  [phone]) [phone] Hours of Operation:  9 am - 6 pm, M-F.  Also accepts Medicaid/Medicare and self-pay.  [facility]  [address], [place] Phone: [phone]) [phone], Fax: [phone]. Hours of Operation:  8:30 am - 5:30 pm, M-F.  Also accepts Medicaid and self-pay.  [facility] [place] [address], [place] Phone: [phone]) [phone]   [facility] [address], [place], [place] [phone], Ext. [phone] Mondays & Thursdays: 7-9 AM.  First 15 patients are seen on a first come, first serve basis.    Medicaid-accepting [facility] Providers:  Organization         Address  Phone   Notes  [facility] [address], [place] [phone] Also accepts self-pay patients.  [facility] [address], [place]  [phone]   [facility] [address], [place] [phone]) [phone]   [facility] [address] [place] [address], [place] [phone]) [phone]   Renaye RakersVeita Bland [address], [place]   [phone] Only accepts [place] Access [place] patients after they have their name applied to their card.   Self-Pay (no insurance) in [facility]:  Organization         Address  Phone   Notes  Sickle Cell Patients, [facility] [address], [place] [phone]) [phone]   [facility] [address], [place] [phone]   [facility] [place]  [address], [place] [phone]) [phone]   [facility]/Dr. Osei-Bonsu  [address] [place] [address], [place] or [phone], [place] [phone]) [phone] Phone number for both [place] and [place] locations is the same.  [facility] [address], [place] [phone]   [facility] [place] [address] [place] [address], [place] or [address] [phone]) [phone]   [facility] [address], [place] [phone]) [phone], phone; [phone], fax Sees patients 1st and 3rd Saturday of every month.  Must  not qualify for public or private insurance (i.e. Medicaid, Medicare, [place] Health Choice, Veterans' Benefits)  Household income should be no more than 200% of the poverty level The clinic cannot treat you if you are pregnant or think you are pregnant  Sexually transmitted diseases are not treated at the clinic.    Dental Care: Organization         Address  Phone  Notes  [facility] of  Public Health [facility] [address] [place], [place] [phone] Accepts children up to age 44 who are enrolled in [place] or [place] Health Choice; pregnant women with a Medicaid card; and children who have applied for Medicaid or [place] Health Choice, but were declined, whose parents can pay a reduced fee at time of service.  [facility]  [address], [place] [phone] Accepts children up to age 57 who are enrolled in [place] or [place] Health Choice; pregnant women with a Medicaid card; and children who have applied for Medicaid or [place] Health Choice, but were declined, whose parents can pay a reduced fee at time of service.  [facility]  [address] [place], [place] [phone] Patients are seen by appointment only. Walk-ins are not accepted. [facility] will see patients 2 years of age and older. Monday - Tuesday (8am-5pm) Most Wednesdays (8:30-5pm) $30 per visit, cash only  [facility] Adult Dental Access PROGRAM  [address], [facility] [phone] Patients are seen by appointment only. Walk-ins are not accepted. [facility] will see patients 67 years of age and older. One Wednesday Evening (Monthly: Volunteer Based).  $30 per visit, cash only  [employer]  [phone] for adults; Children under age 53, call Graduate Pediatric Dentistry at [phone]. Children aged 100-14, please call [phone] to request a pediatric application.  Dental services are  provided in all areas of dental care including fillings, crowns and bridges, complete and partial dentures, implants, gum treatment, root canals, and extractions. Preventive care is also provided. Treatment is provided to both adults and children. Patients are selected via a lottery and there is often a waiting list.   [facility] [address] [place]  [phone] [email or web address]   [facility] [address] [place], [place] [phone], Ext. [phone] Second and Fourth Thursday of each month, opens at 6:30 AM; Clinic ends at 9 AM.  Patients are seen on a first-come first-served basis, and a limited number are seen during each clinic.   [facility]  [address] [place], [place] [phone]   Eligibility Requirements You must have lived in [place], [place], or [place] counties for at least the last three months.   You cannot be eligible for state or federal sponsored [place], including [employer], [place], or [employer].   You generally cannot be eligible for healthcare insurance through your employer.    How to apply: Eligibility screenings are held every Tuesday and Wednesday afternoon from 1:00 pm until 4:00 pm. You do not need an appointment for the interview!  [facility] [address] [place], [place] [phone]   [facility] Health Department  [phone]   [facility] Health Department  [phone]   [facility] Health Department  [phone]    Behavioral Health Resources in the Community: Intensive Outpatient Programs Organization         Address  Phone  Notes  [facility] Services [address] [place], [place] [phone]   [facility] Outpatient [address] [place], [place] [phone]   [facility]: Alcohol & Drug Svcs [address] [place], [place]  [phone]   [facility] Mental Health [address],  [place], [place]  [phone] or [phone]   Substance Abuse Resources Organization         Address  Phone  Notes  Alcohol and Drug Services  [place]  [phone]   [facility] [place]  [phone]   Chinita Pester  [phone]   Residential & Outpatient Substance Abuse Program  [phone]   Psychological Services Organization         Address  Phone  Notes  [facility] [place]  [place]  [phone]   [place] [address] [place] [address] [place] or [phone]    Mobile Crisis Teams Organization         Address  Phone  Notes  [facility], Mobile Crisis Care Unit  [phone]   [facility]  [address] [place], [place]   [place] Levels [address] [place] [phone]    Self-Help/Support Groups Organization         Address  Phone             Notes  [place] of [place] - variety of support groups  [place] Call for more information  Narcotics Anonymous (NA), [facility] [address], [employer] [place]  2 meetings at this location   Special educational needs teacher         Address  Phone  Notes  ASAP Residential Treatment [place],    [place]  [phone]   [facility]  [address], [place] [phone], [place], [place]   [place], [place] [phone] Admissions: 8am-3pm M-F  Incentives Substance [place] [address],    [place], [place] [phone]   [facility] [address] [place], [place], [place]   [facility] Adult Mental Health Services [address],  [place], [place]   [facility] - Intensive Outpatient [address], [place], [place]   [facility] ([place].,  [place], [place] [phone] or  [phone]   [facility]idential Treatment Services (RTS) [address], [place], [place] Accepts Medicaid  [facility] [place] [address],  [place] [phone] Substance Abuse/Addiction Treatment   [facility] Organization         Address  Phone  Notes  [facility]  [phone]   Domenic Schwab, PhD [address] Arlis Porta [place], [place]   [phone] or [phone]   [place], [place] [phone]   [facility] [address], [place], [place] [phone] Insurance/Medicaid/sponsorship through [facility] and Families [address]                                    [place], [place] [phone] [place] [address], [place] [phone]    Dr. Adele Schilder  [phone]   [facility] Dept. 1) [address] [place] 2) [place] 3)  [address], [place] [phone]  [phone]   [place] [phone] or [phone] (After Hours)

## 2013-04-21 NOTE — ED Notes (Signed)
Patient reports being woken up at 0100 this morning with "a migraine." Patient reports having a history of migraines since 1988.

## 2013-04-21 NOTE — ED Notes (Signed)
Pt changing into gown at this time; family at bedside

## 2013-04-24 NOTE — ED Provider Notes (Signed)
Medical screening examination/treatment/procedure(s) were performed by non-physician practitioner and as supervising physician I was immediately available for consultation/collaboration.   EKG Interpretation None       Tiffini Blacksher T Ivalene Platte, MD 04/24/13 1448 

## 2013-11-15 ENCOUNTER — Emergency Department (HOSPITAL_COMMUNITY): Payer: Self-pay

## 2013-11-15 ENCOUNTER — Emergency Department (HOSPITAL_COMMUNITY)
Admission: EM | Admit: 2013-11-15 | Discharge: 2013-11-15 | Disposition: A | Discharge status: Home / Self Care | Payer: Self-pay | Attending: Emergency Medicine | Admitting: Emergency Medicine

## 2013-11-15 ENCOUNTER — Encounter (HOSPITAL_COMMUNITY): Payer: Self-pay | Admitting: Emergency Medicine

## 2013-11-15 DIAGNOSIS — M549 Dorsalgia, unspecified: Secondary | ICD-10-CM | POA: Insufficient documentation

## 2013-11-15 DIAGNOSIS — Z7982 Long term (current) use of aspirin: Secondary | ICD-10-CM | POA: Insufficient documentation

## 2013-11-15 DIAGNOSIS — IMO0002 Reserved for concepts with insufficient information to code with codable children: Secondary | ICD-10-CM | POA: Insufficient documentation

## 2013-11-15 DIAGNOSIS — Z87828 Personal history of other (healed) physical injury and trauma: Secondary | ICD-10-CM | POA: Insufficient documentation

## 2013-11-15 DIAGNOSIS — Z8679 Personal history of other diseases of the circulatory system: Secondary | ICD-10-CM | POA: Insufficient documentation

## 2013-11-15 DIAGNOSIS — M5416 Radiculopathy, lumbar region: Secondary | ICD-10-CM

## 2013-11-15 DIAGNOSIS — F17200 Nicotine dependence, unspecified, uncomplicated: Secondary | ICD-10-CM | POA: Insufficient documentation

## 2013-11-15 HISTORY — DX: Unspecified firearm discharge, undetermined intent, initial encounter: Y24.9XXA

## 2013-11-15 HISTORY — DX: Accidental discharge from unspecified firearms or gun, initial encounter: W34.00XA

## 2013-11-15 MED ORDER — PREDNISONE 20 MG PO TABS
60.0000 mg | ORAL_TABLET | Freq: Once | ORAL | Status: AC
  Administered 2013-11-15: 60 mg via ORAL
  Filled 2013-11-15: qty 3

## 2013-11-15 MED ORDER — TRAMADOL HCL 50 MG PO TABS: 50.0000 mg | ORAL_TABLET | Freq: Four times a day (QID) | ORAL | Status: DC | PRN

## 2013-11-15 MED ORDER — PREDNISONE 10 MG PO TABS: ORAL_TABLET | ORAL | Status: DC
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
## 2013-11-15 NOTE — Discharge Instructions (Signed)
Prednisone as prescribed until all gone for inflammation. Ultram for severe pain. See back exercises below. Follow up with your primary care doctor or as referred if not improving. Return if complete weakness or numbness in extremities, loss of bladder or bowel function, or any new concerning symptoms.   Lumbosacral Radiculopathy Lumbosacral radiculopathy is a pinched nerve or nerves in the low back (lumbosacral area). When this happens you may have weakness in your legs and may not be able to stand on your toes. You may have pain going down into your legs. There may be difficulties with walking normally. There are many causes of this problem. Sometimes this may happen from an injury, or simply from arthritis or boney problems. It may also be caused by other illnesses such as diabetes. If there is no improvement after treatment, further studies may be done to find the exact cause. DIAGNOSIS  X-rays may be needed if the problems become long standing. Electromyograms may be done. This study is one in which the working of nerves and muscles is studied. HOME CARE INSTRUCTIONS   Applications of ice packs may be helpful. Ice can be used in a plastic bag with a towel around it to prevent frostbite to skin. This may be used every 2 hours for 20 to 30 minutes, or as needed, while awake, or as directed by your caregiver.  Only take over-the-counter or prescription medicines for pain, discomfort, or fever as directed by your caregiver.  If physical therapy was prescribed, follow your caregiver's directions. SEEK IMMEDIATE MEDICAL CARE IF:   You have pain not controlled with medications.  You seem to be getting worse rather than better.  You develop increasing weakness in your legs.  You develop loss of bowel or bladder control.  You have difficulty with walking or balance, or develop clumsiness in the use of your legs.  You have a fever. MAKE SURE YOU:   Understand these instructions.  Will watch  your condition.  Will get help right away if you are not doing well or get worse. Document Released: 02/11/2005 Document Revised: 05/06/2011 Document Reviewed: 10/02/2007 [facility] Patient Information 2015 [place], [place]. This information is not intended to replace advice given to you by your health care provider. Make sure you discuss any questions you have with your health care provider.    Back Exercises Back exercises help treat and prevent back injuries. The goal of back exercises is to increase the strength of your abdominal and back muscles and the flexibility of your back. These exercises should be started when you no longer have back pain. Back exercises include:  Pelvic Tilt. Lie on your back with your knees bent. Tilt your pelvis until the lower part of your back is against the floor. Hold this position 5 to 10 sec and repeat 5 to 10 times.  Knee to Chest. Pull first 1 knee up against your chest and hold for 20 to 30 seconds, repeat this with the other knee, and then both knees. This may be done with the other leg straight or bent, whichever feels better.  Sit-Ups or Curl-Ups. Bend your knees 90 degrees. Start with tilting your pelvis, and do a partial, slow sit-up, lifting your trunk only 30 to 45 degrees off the floor. Take at least 2 to 3 seconds for each sit-up. Do not do sit-ups with your knees out straight. If partial sit-ups are difficult, simply do the above but with only tightening your abdominal muscles and holding it as directed.  Hip-Lift. Lie on your back with your knees flexed 90 degrees. Push down with your feet and shoulders as you raise your hips a couple inches off the floor; hold for 10 seconds, repeat 5 to 10 times.  Back arches. Lie on your stomach, propping yourself up on bent elbows. Slowly press on your hands, causing an arch in your low back. Repeat 3 to 5 times. Any initial stiffness and discomfort should lessen with repetition over time.  Shoulder-Lifts. Lie  face down with arms beside your body. Keep hips and torso pressed to floor as you slowly lift your head and shoulders off the floor. Do not overdo your exercises, especially in the beginning. Exercises may cause you some mild back discomfort which lasts for a few minutes; however, if the pain is more severe, or lasts for more than 15 minutes, do not continue exercises until you see your caregiver. Improvement with exercise therapy for back problems is slow.  See your caregivers for assistance with developing a proper back exercise program. Document Released: 03/21/2004 Document Revised: 05/06/2011 Document Reviewed: 12/13/2010 [facility] Patient Information 2015 [place], [place]. This information is not intended to replace advice given to you by your health care provider. Make sure you discuss any questions you have with your health care provider.

## 2013-11-15 NOTE — ED Provider Notes (Signed)
Medical screening examination/treatment/procedure(s) were performed by non-physician practitioner and as supervising physician I was immediately available for consultation/collaboration.  Deshae Dickison T Taleen Prosser, MD 11/15/13 1650 

## 2013-11-15 NOTE — ED Notes (Signed)
Pt. Is upset due to the wait for x-ray.  Apologized to the pt. For the long wait.  Apologized to the pt. For the wait for x-ray.

## 2013-11-15 NOTE — ED Notes (Signed)
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 Pt reports lower back pain.

## 2013-11-17 ENCOUNTER — Telehealth (HOSPITAL_COMMUNITY): Payer: Self-pay

## 2013-12-03 ENCOUNTER — Encounter (HOSPITAL_COMMUNITY): Payer: Self-pay | Admitting: Emergency Medicine

## 2013-12-03 ENCOUNTER — Emergency Department (HOSPITAL_COMMUNITY)
Admission: EM | Admit: 2013-12-03 | Discharge: 2013-12-03 | Disposition: A | Discharge status: Home / Self Care | Payer: Self-pay | Attending: Emergency Medicine | Admitting: Emergency Medicine

## 2013-12-03 DIAGNOSIS — Z87828 Personal history of other (healed) physical injury and trauma: Secondary | ICD-10-CM | POA: Insufficient documentation

## 2013-12-03 DIAGNOSIS — S01511A Laceration without foreign body of lip, initial encounter: Secondary | ICD-10-CM | POA: Insufficient documentation

## 2013-12-03 DIAGNOSIS — Z8679 Personal history of other diseases of the circulatory system: Secondary | ICD-10-CM | POA: Insufficient documentation

## 2013-12-03 DIAGNOSIS — Z720 Tobacco use: Secondary | ICD-10-CM | POA: Insufficient documentation

## 2013-12-03 DIAGNOSIS — Y92410 Unspecified street and highway as the place of occurrence of the external cause: Secondary | ICD-10-CM | POA: Insufficient documentation

## 2013-12-03 DIAGNOSIS — Y9389 Activity, other specified: Secondary | ICD-10-CM | POA: Insufficient documentation

## 2013-12-03 MED ORDER — BACITRACIN 500 UNIT/GM EX OINT: 1.0000 "application " | TOPICAL_OINTMENT | Freq: Two times a day (BID) | CUTANEOUS | Status: DC

## 2013-12-03 MED ORDER — LIDOCAINE HCL (PF) 1 % IJ SOLN
5.0000 mL | Freq: Once | INTRAMUSCULAR | Status: AC
  Administered 2013-12-03: 5 mL
  Filled 2013-12-03: qty 5

## 2013-12-03 MED ORDER — TRAMADOL HCL 50 MG PO TABS: 50.0000 mg | ORAL_TABLET | Freq: Four times a day (QID) | ORAL | Status: DC | PRN
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
## 2013-12-03 NOTE — Discharge Instructions (Signed)
Please follow up with your primary care physician in 1-2 days. If you do not have one please [facility] listed above. Please take pain medication and/or muscle relaxants as prescribed and as needed for pain. Please do not drive on narcotic pain medication or on muscle relaxants. Please read all discharge instructions and return precautions.    Facial Laceration  A facial laceration is a cut on the face. These injuries can be painful and cause bleeding. Lacerations usually heal quickly, but they need special care to reduce scarring. DIAGNOSIS  Your health care provider will take a medical history, ask for details about how the injury occurred, and examine the wound to determine how deep the cut is. TREATMENT  Some facial lacerations may not require closure. Others may not be able to be closed because of an increased risk of infection. The risk of infection and the chance for successful closure will depend on various factors, including the amount of time since the injury occurred. The wound may be cleaned to help prevent infection. If closure is appropriate, pain medicines may be given if needed. Your health care provider will use stitches (sutures), wound glue (adhesive), or skin adhesive strips to repair the laceration. These tools bring the skin edges together to allow for faster healing and a better cosmetic outcome. If needed, you may also be given a tetanus shot. HOME CARE INSTRUCTIONS  Only take over-the-counter or prescription medicines as directed by your health care provider.  Follow your health care provider's instructions for wound care. These instructions will vary depending on the technique used for closing the wound. For Sutures:  Keep the wound clean and dry.   If you were given a bandage (dressing), you should change it at least once a day. Also change the dressing if it becomes wet or dirty, or as directed by your health care provider.    Wash the wound with soap and water 2 times a day. Rinse the wound off with water to remove all soap. Pat the wound dry with a clean towel.   After cleaning, apply a thin layer of the antibiotic ointment recommended by your health care provider. This will help prevent infection and keep the dressing from sticking.   You may shower as usual after the first 24 hours. Do not soak the wound in water until the sutures are removed.   Get your sutures removed as directed by your health care provider. With facial lacerations, sutures should usually be taken out after 4-5 days to avoid stitch marks.   Wait a few days after your sutures are removed before applying any makeup. For Skin Adhesive Strips:  Keep the wound clean and dry.   Do not get the skin adhesive strips wet. You may bathe carefully, using caution to keep the wound dry.   If the wound gets wet, pat it dry with a clean towel.   Skin adhesive strips will fall off on their own. You may trim the strips as the wound heals. Do not remove skin adhesive strips that are still stuck to the wound. They will fall off in time.  For Wound Adhesive:  You may briefly wet your wound in the shower or bath. Do not soak or scrub the wound. Do not swim. Avoid periods of heavy sweating until the skin adhesive has fallen off on its own. After showering or bathing, gently pat the wound dry with a clean towel.   Do not apply liquid  medicine, cream medicine, ointment medicine, or makeup to your wound while the skin adhesive is in place. This may loosen the film before your wound is healed.   If a dressing is placed over the wound, be careful not to apply tape directly over the skin adhesive. This may cause the adhesive to be pulled off before the wound is healed.   Avoid prolonged exposure to sunlight or tanning lamps while the skin adhesive is in place.  The skin adhesive will usually remain in place for 5-10 days, then naturally fall off the  skin. Do not pick at the adhesive film.  After Healing: Once the wound has healed, cover the wound with sunscreen during the day for 1 full year. This can help minimize scarring. Exposure to ultraviolet light in the first year will darken the scar. It can take 1-2 years for the scar to lose its redness and to heal completely.  SEEK IMMEDIATE MEDICAL CARE IF:  You have redness, pain, or swelling around the wound.   You see ayellowish-white fluid (pus) coming from the wound.   You have chills or a fever.  MAKE SURE YOU:  Understand these instructions.  Will watch your condition.  Will get help right away if you are not doing well or get worse. Document Released: 03/21/2004 Document Revised: 12/02/2012 Document Reviewed: 09/24/2012 [facility] Patient Information 2015 [place]. This information is not intended to replace advice given to you by your health care provider. Make sure you discuss any questions you have with your health care provider. Sutured Wound Care Sutures are stitches that can be used to close wounds. Wound care helps prevent pain and infection.  HOME CARE INSTRUCTIONS   Rest and elevate the injured area until all the pain and swelling are gone.  Only take over-the-counter or prescription medicines for pain, discomfort, or fever as directed by your caregiver.  After 48 hours, gently wash the area with mild soap and water once a day, or as directed. Rinse off the soap. Pat the area dry with a clean towel. Do not rub the wound. This may cause bleeding.  Follow your caregiver's instructions for how often to change the bandage (dressing). Stop using a dressing after 2 days or after the wound stops draining.  If the dressing sticks, moisten it with soapy water and gently remove it.  Apply ointment on the wound as directed.  Avoid stretching a sutured wound.  Drink enough fluids to keep your urine clear or pale yellow.  Follow up with your caregiver for suture  removal as directed.  Use sunscreen on your wound for the next 3 to 6 months so the scar will not darken. SEEK IMMEDIATE MEDICAL CARE IF:   Your wound becomes red, swollen, hot, or tender.  You have increasing pain in the wound.  You have a red streak that extends from the wound.  There is pus coming from the wound.  You have a fever.  You have shaking chills.  There is a bad smell coming from the wound.  You have persistent bleeding from the wound. MAKE SURE YOU:   Understand these instructions.  Will watch your condition.  Will get help right away if you are not doing well or get worse. Document Released: 03/21/2004 Document Revised: 05/06/2011 Document Reviewed: 06/17/2010 [facility] Patient Information 2015 [place]. This information is not intended to replace advice given to you by your health care provider. Make sure you discuss any questions you have with your health care  provider.
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
## 2013-12-04 NOTE — ED Provider Notes (Signed)
Medical screening examination/treatment/procedure(s) were performed by non-physician practitioner and as supervising physician I was immediately available for consultation/collaboration.   EKG Interpretation None       Doug SouSam Isabelly Kobler, MD 12/04/13 0020

## 2013-12-09 ENCOUNTER — Ambulatory Visit (INDEPENDENT_AMBULATORY_CARE_PROVIDER_SITE_OTHER): Payer: Self-pay | Admitting: Family

## 2013-12-09 ENCOUNTER — Encounter: Payer: Self-pay | Admitting: Family

## 2013-12-09 VITALS — BP 122/84 | HR 68 | Temp 97.8°F | Resp 18 | Ht 72.0 in | Wt 187.6 lb

## 2013-12-09 DIAGNOSIS — M542 Cervicalgia: Secondary | ICD-10-CM

## 2013-12-09 DIAGNOSIS — M5442 Lumbago with sciatica, left side: Secondary | ICD-10-CM

## 2013-12-09 DIAGNOSIS — G8929 Other chronic pain: Secondary | ICD-10-CM | POA: Insufficient documentation

## 2013-12-09 MED ORDER — METHOCARBAMOL 500 MG PO TABS: 500.0000 mg | ORAL_TABLET | Freq: Three times a day (TID) | ORAL | Status: DC | PRN

## 2013-12-09 MED ORDER — MELOXICAM 7.5 MG PO TABS
7.5000 mg | ORAL_TABLET | Freq: Every day | ORAL | Status: DC
Start: 2013-12-09 — End: 2014-02-15

## 2013-12-09 NOTE — Assessment & Plan Note (Signed)
Start Mobic for inflammation and robaxin for muscle spasm. Refer to pain management for long term pain control and orthopedics for low back pain and sciatica and neck pain. Instructed on safety of using muscle relaxer and note for work given to patient.

## 2013-12-09 NOTE — Patient Instructions (Addendum)
Thank you for choosing [facility].  Summary/Instructions:   Your prescription has been sent to your pharmacy  Your referrals to pain management and orthopedics have been submitted and should hear back in about a week.   Thank you for enrolling in MyChart. Please follow the instructions below to securely access your online medical record. MyChart allows you to send messages to your doctor, view your test results, renew your prescriptions, schedule appointments, and more.  How Do I Sign Up? 1. In your Internet browser, go to [email or web address]. 2. Click on the New  User? link in the Sign In box.  3. Enter your MyChart Access Code exactly as it appears below. You will not need to use this code after you have completed the sign-up process. If you do not sign up before the expiration date, you must request a new code. MyChart Access Code: [email or web address] Expires: 01/14/2014  2:48 PM  4. Enter the last four digits of your Social Security Number (xxxx) and Date of Birth (mm/dd/yyyy) as indicated and click Next. You will be taken to the next sign-up page. 5. Create a MyChart ID. This will be your MyChart login ID and cannot be changed, so think of one that is secure and easy to remember. 6. Create a MyChart password. You can change your password at any time. 7. Enter your Password Reset Question and Answer and click Next. This can be used at a later time if you forget your password.  8. Select your communication preference, and if applicable enter your e-mail address. You will receive e-mail notification when new information is available in MyChart by choosing to receive e-mail notifications and filling in your e-mail. 9. Click Sign In. You can now view your medical record.   Additional Information If you have questions, you can email [email or web address] or call [phone] to talk to our MyChart staff. Remember, MyChart is NOT to be used for urgent needs.  For medical emergencies, dial 911.

## 2013-12-09 NOTE — Progress Notes (Signed)
Pre visit review using our clinic review tool, if applicable. No additional management support is needed unless otherwise documented below in the visit note. 
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
## 2013-12-10 ENCOUNTER — Encounter: Payer: Self-pay | Admitting: Family

## 2013-12-10 ENCOUNTER — Ambulatory Visit (INDEPENDENT_AMBULATORY_CARE_PROVIDER_SITE_OTHER): Payer: Self-pay | Admitting: Family

## 2013-12-10 ENCOUNTER — Telehealth: Payer: Self-pay | Admitting: Family

## 2013-12-10 VITALS — BP 130/80 | HR 58 | Temp 97.9°F | Resp 18 | Wt 186.4 lb

## 2013-12-10 DIAGNOSIS — R300 Dysuria: Secondary | ICD-10-CM | POA: Insufficient documentation

## 2013-12-10 DIAGNOSIS — Z0000 Encounter for general adult medical examination without abnormal findings: Secondary | ICD-10-CM | POA: Insufficient documentation

## 2013-12-10 HISTORY — DX: Encounter for general adult medical examination without abnormal findings: Z00.00

## 2013-12-10 HISTORY — DX: Dysuria: R30.0

## 2013-12-10 NOTE — Telephone Encounter (Signed)
emmi mailed  °

## 2013-12-10 NOTE — Patient Instructions (Signed)
Thank you for choosing [facility].  Summary/Instructions:   Choose a Dove, Marda StalkerAveeno, or Eucerin product for skin moisturizer  Follow up with eye and dental exams  Get labs done after 12/26/13.  Follow up for physical in 1 year .

## 2013-12-10 NOTE — Progress Notes (Signed)
Pre visit review using our clinic review tool, if applicable. No additional management support is needed unless otherwise documented below in the visit note. 

## 2013-12-10 NOTE — Assessment & Plan Note (Signed)
Exam grossly normal with the exception of the neck and back pain that was previously described. Referrals made to dentist and opthamology to establish care. Will obtain labs once patient has insurance.
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
## 2014-02-15 ENCOUNTER — Ambulatory Visit (INDEPENDENT_AMBULATORY_CARE_PROVIDER_SITE_OTHER): Payer: BC Managed Care – PPO | Admitting: Family

## 2014-02-15 ENCOUNTER — Encounter: Payer: Self-pay | Admitting: Family

## 2014-02-15 VITALS — BP 118/78 | HR 81 | Temp 98.3°F | Resp 18 | Ht 72.0 in | Wt 188.4 lb

## 2014-02-15 DIAGNOSIS — M542 Cervicalgia: Secondary | ICD-10-CM

## 2014-02-15 DIAGNOSIS — M5442 Lumbago with sciatica, left side: Secondary | ICD-10-CM

## 2014-02-15 MED ORDER — HYDROCODONE-ACETAMINOPHEN 5-325 MG PO TABS: 1.0000 | ORAL_TABLET | Freq: Four times a day (QID) | ORAL | Status: DC | PRN

## 2014-02-15 MED ORDER — DIAZEPAM 5 MG PO TABS: 5.0000 mg | ORAL_TABLET | Freq: Two times a day (BID) | ORAL | Status: DC | PRN

## 2014-02-15 NOTE — Assessment & Plan Note (Signed)
Continues to experience increasing neck pain. The schedule for cortisone injections. May schedule potential surgery. Refer to pain clinic for further pain management. Follow up as needed per orthopedics.

## 2014-02-15 NOTE — Progress Notes (Signed)
Pre visit review using our clinic review tool, if applicable. No additional management support is needed unless otherwise documented below in the visit note. 

## 2014-02-15 NOTE — Progress Notes (Signed)
   Subjective:    Patient ID: Bryan Valdez, male    DOB: 02/01/1966, 48 y.o.   MRN: [id number]  Chief Complaint  Patient presents with  . Follow-up    says he was told he needed surgery, no pain meds work for back pain    HPI:  Bryan Valdez is a 48 y.o. male who presents today for follow up.  1) Back and neck - had MRI completed and saw the orthopedic and indicates that he is needing surgery for both his neck and his back. Indicates he continues to have both chronic low back and neck pain. Pain is described as a "constant pain" with an intensity that is rated at approximately 6/10. He was previously on tramadol and methocarbamol which have provided minimal to no relief. Movement continues to make the pain worse. He is scheduled to have cortisone injections on January 5th.    Allergies  Allergen Reactions  . Naproxen     trigggers cluster migraines   No current outpatient prescriptions on file prior to visit.   No current facility-administered medications on file prior to visit.    Review of Systems    See HPI  Objective:    BP 118/78 mmHg  Pulse 81  Temp(Src) 98.3 F (36.8 C) (Oral)  Resp 18  Ht 6' (1.829 m)  Wt 188 lb 7.2 oz (85.48 kg)  BMI 25.55 kg/m2  SpO2 97% Nursing note and vital signs reviewed.  Physical Exam  Constitutional: He is oriented to person, place, and time. He appears well-developed and well-nourished. No distress.  Cardiovascular: Normal rate, regular rhythm, normal heart sounds and intact distal pulses.   Pulmonary/Chest: Effort normal and breath sounds normal.  Musculoskeletal:  Point tenderness and spasm elicited left lateral side of cervical spine in area of upper trapezius. Distal extremity range of motion remains intact. Pulse intact and appropriate. Some numbness and tingling along C5 dermatome.  Low back pain continues midline low back with tenderness elicited lumbar spine. No other changes since previous exam.  Neurological: He is alert  and oriented to person, place, and time.  Skin: Skin is warm and dry.  Psychiatric: He has a normal mood and affect. His behavior is normal. Judgment and thought content normal.       Assessment & Plan:

## 2014-02-15 NOTE — Patient Instructions (Signed)
Thank you for choosing  HealthCare.  Summary/Instructions:  Your prescription(s) have been submitted to your pharmacy. Please take as directed and contact our office if you believe you are having problem(s) with the medication(s).  If your symptoms worsen or fail to improve, please contact our office for further instruction, or in case of emergency go directly to the emergency room at the closest medical facility.      

## 2014-02-15 NOTE — Assessment & Plan Note (Signed)
Patient continues to experience low back pain secondary to leg length discrepancy and previous surgeries. Pain management remains an issue. Start Vicodin until pain clinic is available. Start Valium 5 mg as needed for sleep. Follow up pending orthopedic injections and/or surgery.

## 2014-05-07 ENCOUNTER — Emergency Department (HOSPITAL_COMMUNITY)
Admission: EM | Admit: 2014-05-07 | Discharge: 2014-05-07 | Disposition: A | Discharge status: Home / Self Care | Payer: PRIVATE HEALTH INSURANCE | Attending: Emergency Medicine | Admitting: Emergency Medicine

## 2014-05-07 ENCOUNTER — Emergency Department (HOSPITAL_COMMUNITY): Payer: PRIVATE HEALTH INSURANCE

## 2014-05-07 ENCOUNTER — Encounter (HOSPITAL_COMMUNITY): Payer: Self-pay | Admitting: Emergency Medicine

## 2014-05-07 DIAGNOSIS — R05 Cough: Secondary | ICD-10-CM

## 2014-05-07 DIAGNOSIS — R059 Cough, unspecified: Secondary | ICD-10-CM

## 2014-05-07 DIAGNOSIS — Z87828 Personal history of other (healed) physical injury and trauma: Secondary | ICD-10-CM | POA: Insufficient documentation

## 2014-05-07 DIAGNOSIS — Z8679 Personal history of other diseases of the circulatory system: Secondary | ICD-10-CM | POA: Insufficient documentation

## 2014-05-07 DIAGNOSIS — R52 Pain, unspecified: Secondary | ICD-10-CM | POA: Diagnosis present

## 2014-05-07 DIAGNOSIS — J189 Pneumonia, unspecified organism: Secondary | ICD-10-CM

## 2014-05-07 DIAGNOSIS — Z87891 Personal history of nicotine dependence: Secondary | ICD-10-CM | POA: Diagnosis not present

## 2014-05-07 DIAGNOSIS — J159 Unspecified bacterial pneumonia: Secondary | ICD-10-CM | POA: Diagnosis not present

## 2014-05-07 DIAGNOSIS — R509 Fever, unspecified: Secondary | ICD-10-CM

## 2014-05-07 LAB — I-STAT CHEM 8, ED
BUN: 17 mg/dL (ref 6–23)
CHLORIDE: 100 mmol/L (ref 96–112)
CREATININE: 1.1 mg/dL (ref 0.50–1.35)
Calcium, Ion: 1.03 mmol/L — ABNORMAL LOW (ref 1.12–1.23)
Glucose, Bld: 113 mg/dL — ABNORMAL HIGH (ref 70–99)
HCT: 43 % (ref 39.0–52.0)
Hemoglobin: 14.6 g/dL (ref 13.0–17.0)
Potassium: 3.7 mmol/L (ref 3.5–5.1)
SODIUM: 140 mmol/L (ref 135–145)
TCO2: 24 mmol/L (ref 0–100)

## 2014-05-07 LAB — CBC WITH DIFFERENTIAL/PLATELET
Basophils Absolute: 0 10*3/uL (ref 0.0–0.1)
Basophils Relative: 0 % (ref 0–1)
EOS ABS: 0 10*3/uL (ref 0.0–0.7)
EOS PCT: 0 % (ref 0–5)
HCT: 39.5 % (ref 39.0–52.0)
HEMOGLOBIN: 13.6 g/dL (ref 13.0–17.0)
LYMPHS PCT: 25 % (ref 12–46)
Lymphs Abs: 1.4 10*3/uL (ref 0.7–4.0)
MCH: 32.2 pg (ref 26.0–34.0)
MCHC: 34.4 g/dL (ref 30.0–36.0)
MCV: 93.4 fL (ref 78.0–100.0)
MONOS PCT: 15 % — AB (ref 3–12)
Monocytes Absolute: 0.8 10*3/uL (ref 0.1–1.0)
Neutro Abs: 3.3 10*3/uL (ref 1.7–7.7)
Neutrophils Relative %: 60 % (ref 43–77)
PLATELETS: 160 10*3/uL (ref 150–400)
RBC: 4.23 MIL/uL (ref 4.22–5.81)
RDW: 13.4 % (ref 11.5–15.5)
WBC: 5.5 10*3/uL (ref 4.0–10.5)

## 2014-05-07 LAB — I-STAT CG4 LACTIC ACID, ED: Lactic Acid, Venous: 1.03 mmol/L (ref 0.5–2.0)

## 2014-05-07 MED ORDER — AZITHROMYCIN 250 MG PO TABS
500.0000 mg | ORAL_TABLET | Freq: Once | ORAL | Status: AC
  Administered 2014-05-07: 500 mg via ORAL
  Filled 2014-05-07: qty 2

## 2014-05-07 MED ORDER — DEXTROSE 5 % IV SOLN
1.0000 g | Freq: Once | INTRAVENOUS | Status: AC
  Administered 2014-05-07: 1 g via INTRAVENOUS
  Filled 2014-05-07: qty 10

## 2014-05-07 MED ORDER — AZITHROMYCIN 250 MG PO TABS: ORAL_TABLET | ORAL | Status: DC

## 2014-05-07 MED ORDER — ONDANSETRON HCL 4 MG PO TABS
4.0000 mg | ORAL_TABLET | Freq: Once | ORAL | Status: AC
  Administered 2014-05-07: 4 mg via ORAL
  Filled 2014-05-07: qty 1

## 2014-05-07 MED ORDER — SODIUM CHLORIDE 0.9 % IV BOLUS (SEPSIS)
1000.0000 mL | Freq: Once | INTRAVENOUS | Status: AC
  Administered 2014-05-07: 1000 mL via INTRAVENOUS

## 2014-05-07 MED ORDER — IBUPROFEN 400 MG PO TABS
600.0000 mg | ORAL_TABLET | Freq: Once | ORAL | Status: AC
  Administered 2014-05-07: 600 mg via ORAL
  Filled 2014-05-07 (×2): qty 1

## 2014-05-07 MED ORDER — ACETAMINOPHEN 325 MG PO TABS
650.0000 mg | ORAL_TABLET | Freq: Once | ORAL | Status: AC
  Administered 2014-05-07: 650 mg via ORAL
  Filled 2014-05-07: qty 2

## 2014-05-07 MED ORDER — ALBUTEROL SULFATE HFA 108 (90 BASE) MCG/ACT IN AERS
2.0000 | INHALATION_SPRAY | Freq: Once | RESPIRATORY_TRACT | Status: AC
  Administered 2014-05-07: 2 via RESPIRATORY_TRACT
  Filled 2014-05-07: qty 6.7
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
## 2014-05-07 NOTE — Discharge Instructions (Signed)
Return to the emergency room with worsening of symptoms, new symptoms or with symptoms that are concerning, , especially fevers, stiff neck, worsening headache, nausea/vomiting, visual changes or slurred speech, chest pain, shortness of breath, cough with thick colored mucous or blood Drink plenty of fluids with electrolytes especially Gatorade. OTC cold medications such as mucinex, nyquil, dayquil are recommended. Chloraseptic for sore throat. Please call your doctor for a followup appointment within 24-48 hours. When you talk to your doctor please let them know that you were seen in the emergency department and have them acquire all of your records so that they can discuss the findings with you and formulate a treatment plan to fully care for your new and ongoing problems. Please take all of your antibiotics until finished!   You may develop abdominal discomfort or diarrhea from the antibiotic.  You may help offset this with probiotics which you can buy or get in yogurt. Do not eat  or take the probiotics until 2 hours after your antibiotic.  Read below information and follow recommendations. Pneumonia Pneumonia is an infection of the lungs.  CAUSES Pneumonia may be caused by bacteria or a virus. Usually, these infections are caused by breathing infectious particles into the lungs (respiratory tract). SIGNS AND SYMPTOMS   Cough.  Fever.  Chest pain.  Increased rate of breathing.  Wheezing.  Mucus production. DIAGNOSIS  If you have the common symptoms of pneumonia, your health care provider will typically confirm the diagnosis with a chest X-ray. The X-ray will show an abnormality in the lung (pulmonary infiltrate) if you have pneumonia. Other tests of your blood, urine, or sputum may be done to find the specific cause of your pneumonia. Your health care provider may also do tests (blood gases or pulse oximetry) to see how well your lungs are working. TREATMENT  Some forms of pneumonia may  be spread to other people when you cough or sneeze. You may be asked to wear a mask before and during your exam. Pneumonia that is caused by bacteria is treated with antibiotic medicine. Pneumonia that is caused by the influenza virus may be treated with an antiviral medicine. Most other viral infections must run their course. These infections will not respond to antibiotics.  HOME CARE INSTRUCTIONS   Cough suppressants may be used if you are losing too much rest. However, coughing protects you by clearing your lungs. You should avoid using cough suppressants if you can.  Your health care provider may have prescribed medicine if he or she thinks your pneumonia is caused by bacteria or influenza. Finish your medicine even if you start to feel better.  Your health care provider may also prescribe an expectorant. This loosens the mucus to be coughed up.  Take medicines only as directed by your health care provider.  Do not smoke. Smoking is a common cause of bronchitis and can contribute to pneumonia. If you are a smoker and continue to smoke, your cough may last several weeks after your pneumonia has cleared.  A cold steam vaporizer or humidifier in your room or home may help loosen mucus.  Coughing is often worse at night. Sleeping in a semi-upright position in a recliner or using a couple pillows under your head will help with this.  Get rest as you feel it is needed. Your body will usually let you know when you need to rest. PREVENTION A pneumococcal shot (vaccine) is available to prevent a common bacterial cause of pneumonia. This is usually suggested  for:  People over 48 years old.  Patients on chemotherapy.  People with chronic lung problems, such as bronchitis or emphysema.  People with immune system problems. If you are over 65 or have a high risk condition, you may receive the pneumococcal vaccine if you have not received it before. In some countries, a routine influenza vaccine is  also recommended. This vaccine can help prevent some cases of pneumonia.You may be offered the influenza vaccine as part of your care. If you smoke, it is time to quit. You may receive instructions on how to stop smoking. Your health care provider can provide medicines and counseling to help you quit. SEEK MEDICAL CARE IF: You have a fever. SEEK IMMEDIATE MEDICAL CARE IF:   Your illness becomes worse. This is especially true if you are elderly or weakened from any other disease.  You cannot control your cough with suppressants and are losing sleep.  You begin coughing up blood.  You develop pain which is getting worse or is uncontrolled with medicines.  Any of the symptoms which initially brought you in for treatment are getting worse rather than better.  You develop shortness of breath or chest pain. MAKE SURE YOU:   Understand these instructions.  Will watch your condition.  Will get help right away if you are not doing well or get worse. Document Released: 02/11/2005 Document Revised: 06/28/2013 Document Reviewed: 05/03/2010 [facility] Patient Information 2015 [employer] [place]. This information is not intended to replace advice given to you by your health care provider. Make sure you discuss any questions you have with your health care provider.

## 2014-05-07 NOTE — ED Notes (Signed)
Pt arrives with c/o generalized body aches, cough, and malaise ongoing since Sunday, states he was around one of his friends who was sick. No meds on board at this time. Yellow/green sputum.

## 2014-05-07 NOTE — ED Provider Notes (Signed)
49 year old male presents after coughing for approximately one week, has been having fevers, decreased energy, decreased appetite because he states that food does not taste right. He denies vomiting or diarrhea, on exam the patient has no respiratory distress, clear heart and lung sounds without wheezing or rales, no increased work of breathing, speaks in full sentences. Cardiac exam is normal without murmurs, pulse of approximately 70 with strong pulses at the radial arteries and no peripheral edema. His x-ray shows a possible atelectasis versus infiltrate at the base, lactic acid normal, fever has defervesced, blood pressure is soft, will need more fluids IV antibiotics, by mouth trial prior to discharge. Otherwise the patient appears well.  Medical screening examination/treatment/procedure(s) were conducted as a shared visit with non-physician practitioner(s) and myself.  I personally evaluated the patient during the encounter.  Clinical Impression:   Final diagnoses:  Fever  Cough  CAP (community acquired pneumonia)         Eber HongBrian Medlen, MD 05/07/14 1557

## 2014-06-26 ENCOUNTER — Emergency Department (HOSPITAL_COMMUNITY)
Admission: EM | Admit: 2014-06-26 | Discharge: 2014-06-26 | Disposition: A | Discharge status: Home / Self Care | Payer: PRIVATE HEALTH INSURANCE | Attending: Emergency Medicine | Admitting: Emergency Medicine

## 2014-06-26 ENCOUNTER — Encounter (HOSPITAL_COMMUNITY): Payer: Self-pay | Admitting: Emergency Medicine

## 2014-06-26 DIAGNOSIS — Z87891 Personal history of nicotine dependence: Secondary | ICD-10-CM | POA: Insufficient documentation

## 2014-06-26 DIAGNOSIS — Z792 Long term (current) use of antibiotics: Secondary | ICD-10-CM | POA: Insufficient documentation

## 2014-06-26 DIAGNOSIS — Z87828 Personal history of other (healed) physical injury and trauma: Secondary | ICD-10-CM | POA: Insufficient documentation

## 2014-06-26 DIAGNOSIS — G44019 Episodic cluster headache, not intractable: Secondary | ICD-10-CM | POA: Insufficient documentation

## 2014-06-26 MED ORDER — METOCLOPRAMIDE HCL 5 MG/ML IJ SOLN
10.0000 mg | Freq: Once | INTRAMUSCULAR | Status: AC
  Administered 2014-06-26: 10 mg via INTRAVENOUS
  Filled 2014-06-26: qty 2

## 2014-06-26 MED ORDER — PROCHLORPERAZINE EDISYLATE 5 MG/ML IJ SOLN
10.0000 mg | Freq: Four times a day (QID) | INTRAMUSCULAR | Status: DC | PRN
  Filled 2014-06-26: qty 2

## 2014-06-26 MED ORDER — SODIUM CHLORIDE 0.9 % IV BOLUS (SEPSIS)
1000.0000 mL | Freq: Once | INTRAVENOUS | Status: AC
  Administered 2014-06-26: 1000 mL via INTRAVENOUS

## 2014-06-26 MED ORDER — DIPHENHYDRAMINE HCL 50 MG/ML IJ SOLN
25.0000 mg | Freq: Once | INTRAMUSCULAR | Status: AC
  Administered 2014-06-26: 25 mg via INTRAVENOUS
  Filled 2014-06-26: qty 1

## 2014-06-26 MED ORDER — PROMETHAZINE HCL 25 MG/ML IJ SOLN
12.5000 mg | INTRAMUSCULAR | Status: DC | PRN
  Filled 2014-06-26: qty 1

## 2014-06-26 MED ORDER — DEXAMETHASONE SODIUM PHOSPHATE 4 MG/ML IJ SOLN
4.0000 mg | Freq: Once | INTRAMUSCULAR | Status: AC
  Administered 2014-06-26: 4 mg via INTRAVENOUS
  Filled 2014-06-26: qty 1

## 2014-06-26 MED ORDER — KETOROLAC TROMETHAMINE 30 MG/ML IJ SOLN
15.0000 mg | Freq: Once | INTRAMUSCULAR | Status: AC
  Administered 2014-06-26: 15 mg via INTRAVENOUS
  Filled 2014-06-26: qty 1
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
## 2014-06-26 NOTE — ED Notes (Signed)
C/o headache x 1 month.  Pt reports history of cluster headaches and requests "headache cocktail."  Taking BC powder at home without relief.

## 2014-06-26 NOTE — Discharge Instructions (Signed)
Cluster Headache Cluster headaches are recognized by their pattern of deep, intense head pain. They normally occur on one side of your head, but they may "switch sides" in subsequent episodes. Typically, cluster headaches:   Are severe in nature.   Occur repeatedly over weeks to months and are followed by periods of no headaches.   Can last from 15 minutes to 3 hours.   Occur at the same time each day, often at night.   Occur several times a day. CAUSES The exact cause of cluster headaches is not known. Alcohol use may be associated with cluster headaches. SIGNS AND SYMPTOMS   Severe pain that begins in or around your eye or temple.   One-sided head pain.   Feeling sick to your stomach (nauseous).   Sensitivity to light.   Runny nose.   Eye redness, tearing, and nasal stuffiness on the side of your head where you are experiencing pain.   Sweaty, pale skin of the face.   Droopy or swollen eyelid.   Restlessness. DIAGNOSIS  Cluster headaches are diagnosed based on symptoms and a physical exam. Your health care provider may order a CT scan or an MRI of your head or lab tests to see if your headaches are caused by other medical conditions.  TREATMENT   Medicines for pain relief and to prevent recurrent attacks. Some people may need a combination of medicines.  Oxygen for pain relief.   Biofeedback programs to help reduce headache pain.  It may be helpful to keep a headache diary. This may help you find a trend for what is triggering your headaches. Your health care provider can develop a treatment plan.  HOME CARE INSTRUCTIONS  During cluster periods:   Follow a regular sleep schedule. Do not vary the amount and time that you sleep from day to day. It is important to stay on the same schedule during a cluster period to help prevent headaches.   Avoid alcohol.   Stop smoking if you smoke.  SEEK MEDICAL CARE IF:  You have any changes from your previous  cluster headaches either in intensity or frequency.   You are not getting relief from medicines you are taking.  SEEK IMMEDIATE MEDICAL CARE IF:   You faint.   You have weakness or numbness, especially on one side of your body or face.   You have double vision.   You have nausea or vomiting that is not relieved within several hours.   You cannot keep your balance or have difficulty talking or walking.   You have neck pain or stiffness.   You have a fever. MAKE SURE YOU:  Understand these instructions.   Will watch your condition.   Will get help right away if you are not doing well or get worse. Document Released: 02/11/2005 Document Revised: 12/02/2012 Document Reviewed: 09/03/2012 [employer] Patient Information 2015 [employer]. This information is not intended to replace advice given to you by your health care provider. Make sure you discuss any questions you have with your health care provider.  

## 2014-09-20 ENCOUNTER — Encounter: Payer: Self-pay | Admitting: Family

## 2014-09-20 ENCOUNTER — Ambulatory Visit (INDEPENDENT_AMBULATORY_CARE_PROVIDER_SITE_OTHER): Payer: Self-pay | Admitting: Family

## 2014-09-20 ENCOUNTER — Other Ambulatory Visit (INDEPENDENT_AMBULATORY_CARE_PROVIDER_SITE_OTHER): Payer: Self-pay

## 2014-09-20 VITALS — BP 142/84 | HR 83 | Temp 98.0°F | Resp 18 | Ht 72.0 in | Wt 191.0 lb

## 2014-09-20 DIAGNOSIS — R21 Rash and other nonspecific skin eruption: Secondary | ICD-10-CM | POA: Insufficient documentation

## 2014-09-20 DIAGNOSIS — Z7251 High risk heterosexual behavior: Secondary | ICD-10-CM | POA: Insufficient documentation

## 2014-09-20 DIAGNOSIS — R300 Dysuria: Secondary | ICD-10-CM

## 2014-09-20 DIAGNOSIS — R7989 Other specified abnormal findings of blood chemistry: Secondary | ICD-10-CM

## 2014-09-20 DIAGNOSIS — M542 Cervicalgia: Secondary | ICD-10-CM

## 2014-09-20 DIAGNOSIS — M5442 Lumbago with sciatica, left side: Secondary | ICD-10-CM

## 2014-09-20 DIAGNOSIS — Z0000 Encounter for general adult medical examination without abnormal findings: Secondary | ICD-10-CM

## 2014-09-20 HISTORY — DX: Rash and other nonspecific skin eruption: R21

## 2014-09-20 LAB — LIPID PANEL
CHOL/HDL RATIO: 5
CHOLESTEROL: 143 mg/dL (ref 0–200)
HDL: 26.9 mg/dL — ABNORMAL LOW (ref >39.00)
NonHDL: 116.1
TRIGLYCERIDES: 256 mg/dL — AB (ref 0.0–149.0)
VLDL: 51.2 mg/dL — AB (ref 0.0–40.0)

## 2014-09-20 LAB — LDL CHOLESTEROL, DIRECT: Direct LDL: 75 mg/dL

## 2014-09-20 LAB — CBC
HCT: 41.4 % (ref 39.0–52.0)
HEMOGLOBIN: 14.2 g/dL (ref 13.0–17.0)
MCHC: 34.3 g/dL (ref 30.0–36.0)
MCV: 93.3 fl (ref 78.0–100.0)
Platelets: 235 10*3/uL (ref 150.0–400.0)
RBC: 4.44 Mil/uL (ref 4.22–5.81)
RDW: 14.3 % (ref 11.5–15.5)
WBC: 7.4 10*3/uL (ref 4.0–10.5)

## 2014-09-20 LAB — BASIC METABOLIC PANEL WITH GFR
BUN: 10 mg/dL (ref 6–23)
CO2: 29 meq/L (ref 19–32)
Calcium: 9.1 mg/dL (ref 8.4–10.5)
Chloride: 106 meq/L (ref 96–112)
Creatinine, Ser: 0.99 mg/dL (ref 0.40–1.50)
GFR: 103.63 mL/min (ref >60.00)
Glucose, Bld: 91 mg/dL (ref 70–99)
Potassium: 3.8 meq/L (ref 3.5–5.1)
Sodium: 141 meq/L (ref 135–145)

## 2014-09-20 MED ORDER — ACETAMINOPHEN-CODEINE #3 300-30 MG PO TABS: 1.0000 | ORAL_TABLET | Freq: Two times a day (BID) | ORAL | Status: DC | PRN

## 2014-09-20 MED ORDER — SELENIUM SULFIDE 2.5 % EX LOTN: 1.0000 "application " | TOPICAL_LOTION | Freq: Two times a day (BID) | CUTANEOUS | Status: DC
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
## 2014-09-20 NOTE — Patient Instructions (Addendum)
Thank you for choosing [facility].  Summary/Instructions:  Your prescription(s) have been submitted to your pharmacy or been printed and provided for you. Please take as directed and contact our office if you believe you are having problem(s) with the medication(s) or have any questions.  Please stop by the lab on the basement level of the building for your blood work. Your results will be released to MyChart (or called to you) after review, usually within 72 hours after test completion. If any changes need to be made, you will be notified at that same time.  If your symptoms worsen or fail to improve, please contact our office for further instruction, or in case of emergency go directly to the emergency room at the closest medical facility.   Tinea Versicolor Tinea versicolor is a common yeast infection of the skin. This condition becomes known when the yeast on our skin starts to overgrow (yeast is a normal inhabitant on our skin). This condition is noticed as white or light brown patches on brown skin, and is more evident in the summer on tanned skin. These areas are slightly scaly if scratched. The light patches from the yeast become evident when the yeast creates "holes in your suntan". This is most often noticed in the summer. The patches are usually located on the chest, back, pubis, neck and body folds. However, it may occur on any area of body. Mild itching and inflammation (redness or soreness) may be present. DIAGNOSIS  The diagnosisof this is made clinically (by looking). Cultures from samples are usually not needed. Examination under the microscope may help. However, yeast is normally found on skin. The diagnosis still remains clinical. Examination under Wood's Ultraviolet Light can determine the extent of the infection. TREATMENT  This common infection is usually only of cosmetic (only a concern to your appearance). It is easily treated with dandruff shampoo used during showers  or bathing. Vigorous scrubbing will eliminate the yeast over several days time. The light areas in your skin may remain for weeks or months after the infection is cured unless your skin is exposed to sunlight. The lighter or darker spots caused by the fungus that remain after complete treatment are not a sign of treatment failure; it will take a long time to resolve. Your caregiver may recommend a number of commercial preparations or medication by mouth if home care is not working. Recurrence is common and preventative medication may be necessary. This skin condition is not highly contagious. Special care is not needed to protect close friends and family members. Normal hygiene is usually enough. Follow up is required only if you develop complications (such as a secondary infection from scratching), if recommended by your caregiver, or if no relief is obtained from the preparations used. Document Released: 02/09/2000 Document Revised: 05/06/2011 Document Reviewed: 03/23/2008 [facility] Patient Information 2015 [place], [place]. This information is not intended to replace advice given to you by your health care provider. Make sure you discuss any questions you have with your health care provider.

## 2014-09-20 NOTE — Assessment & Plan Note (Signed)
Symptoms and exam consistent with tinea versicolor. Start selsun. Follow up if symptoms worsen or fail to improve.

## 2014-09-20 NOTE — Progress Notes (Signed)
Pre visit review using our clinic review tool, if applicable. No additional management support is needed unless otherwise documented below in the visit note. 

## 2014-09-20 NOTE — Assessment & Plan Note (Signed)
STI blood work previously entered and will be completed.

## 2014-09-20 NOTE — Assessment & Plan Note (Signed)
Seen by orthopedics and surgery for his back and neck was recommended. Has received injections which indicate have helped some, but secondary to price is not able to proceed with further injections or surgery. Has tried multiple pain medications and is currently awaiting admission to a pain clinic. Start Tylenol #3. Continue home therapy at this time. Follow up with orthopedics for additional management.

## 2014-09-21 ENCOUNTER — Telehealth: Payer: Self-pay | Admitting: Family

## 2014-09-21 LAB — GC/CHLAMYDIA PROBE AMP
CT PROBE, AMP APTIMA: NEGATIVE
GC Probe RNA: NEGATIVE

## 2014-09-21 LAB — RPR

## 2014-09-21 LAB — HIV ANTIBODY (ROUTINE TESTING W REFLEX): HIV 1&2 Ab, 4th Generation: NONREACTIVE

## 2014-09-21 NOTE — Telephone Encounter (Signed)
Please inform the patient that his blood work is negative for any of the STD infections that were tested for. Since we did not have any infections we can send him in a dose of metronidazole if he is having issues. Otherwise his lab work was normal.

## 2014-09-22 MED ORDER — METRONIDAZOLE 500 MG PO TABS: 2000.0000 mg | ORAL_TABLET | Freq: Once | ORAL | Status: DC

## 2014-09-22 NOTE — Telephone Encounter (Signed)
Pt does want that sent in to the pharmacy requested.

## 2014-09-22 NOTE — Telephone Encounter (Signed)
Rx sent 

## 2014-12-23 ENCOUNTER — Encounter (HOSPITAL_COMMUNITY): Payer: Self-pay

## 2014-12-23 ENCOUNTER — Emergency Department (HOSPITAL_COMMUNITY): Payer: Self-pay

## 2014-12-23 ENCOUNTER — Emergency Department (HOSPITAL_COMMUNITY)
Admission: EM | Admit: 2014-12-23 | Discharge: 2014-12-23 | Disposition: A | Discharge status: Home / Self Care | Payer: Self-pay | Attending: Emergency Medicine | Admitting: Emergency Medicine

## 2014-12-23 DIAGNOSIS — Z8679 Personal history of other diseases of the circulatory system: Secondary | ICD-10-CM | POA: Insufficient documentation

## 2014-12-23 DIAGNOSIS — S8992XA Unspecified injury of left lower leg, initial encounter: Secondary | ICD-10-CM | POA: Insufficient documentation

## 2014-12-23 DIAGNOSIS — Y92410 Unspecified street and highway as the place of occurrence of the external cause: Secondary | ICD-10-CM | POA: Insufficient documentation

## 2014-12-23 DIAGNOSIS — S27329A Contusion of lung, unspecified, initial encounter: Secondary | ICD-10-CM

## 2014-12-23 DIAGNOSIS — S20219A Contusion of unspecified front wall of thorax, initial encounter: Secondary | ICD-10-CM

## 2014-12-23 DIAGNOSIS — S3992XA Unspecified injury of lower back, initial encounter: Secondary | ICD-10-CM | POA: Insufficient documentation

## 2014-12-23 DIAGNOSIS — Y9355 Activity, bike riding: Secondary | ICD-10-CM | POA: Insufficient documentation

## 2014-12-23 DIAGNOSIS — S301XXA Contusion of abdominal wall, initial encounter: Secondary | ICD-10-CM

## 2014-12-23 DIAGNOSIS — S93402A Sprain of unspecified ligament of left ankle, initial encounter: Secondary | ICD-10-CM

## 2014-12-23 DIAGNOSIS — Z87891 Personal history of nicotine dependence: Secondary | ICD-10-CM | POA: Insufficient documentation

## 2014-12-23 DIAGNOSIS — S199XXA Unspecified injury of neck, initial encounter: Secondary | ICD-10-CM | POA: Insufficient documentation

## 2014-12-23 DIAGNOSIS — Y998 Other external cause status: Secondary | ICD-10-CM | POA: Insufficient documentation

## 2014-12-23 DIAGNOSIS — Z87828 Personal history of other (healed) physical injury and trauma: Secondary | ICD-10-CM | POA: Insufficient documentation

## 2014-12-23 DIAGNOSIS — S4992XA Unspecified injury of left shoulder and upper arm, initial encounter: Secondary | ICD-10-CM | POA: Insufficient documentation

## 2014-12-23 LAB — I-STAT CHEM 8, ED
BUN: 16 mg/dL (ref 6–20)
Calcium, Ion: 1.09 mmol/L — ABNORMAL LOW (ref 1.12–1.23)
Chloride: 102 mmol/L (ref 101–111)
Creatinine, Ser: 1.2 mg/dL (ref 0.61–1.24)
Glucose, Bld: 141 mg/dL — ABNORMAL HIGH (ref 65–99)
HEMATOCRIT: 42 % (ref 39.0–52.0)
Hemoglobin: 14.3 g/dL (ref 13.0–17.0)
POTASSIUM: 3.7 mmol/L (ref 3.5–5.1)
Sodium: 142 mmol/L (ref 135–145)
TCO2: 25 mmol/L (ref 0–100)

## 2014-12-23 MED ORDER — OXYCODONE-ACETAMINOPHEN 5-325 MG PO TABS: 1.0000 | ORAL_TABLET | Freq: Four times a day (QID) | ORAL | Status: DC | PRN

## 2014-12-23 MED ORDER — MORPHINE SULFATE (PF) 4 MG/ML IV SOLN
4.0000 mg | Freq: Once | INTRAVENOUS | Status: AC
  Administered 2014-12-23: 4 mg via INTRAVENOUS
  Filled 2014-12-23: qty 1

## 2014-12-23 MED ORDER — HYDROMORPHONE HCL 1 MG/ML IJ SOLN
1.0000 mg | Freq: Once | INTRAMUSCULAR | Status: AC
  Administered 2014-12-23: 1 mg via INTRAVENOUS
  Filled 2014-12-23: qty 1

## 2014-12-23 MED ORDER — SODIUM CHLORIDE 0.9 % IV BOLUS (SEPSIS)
1000.0000 mL | Freq: Once | INTRAVENOUS | Status: AC
  Administered 2014-12-23: 1000 mL via INTRAVENOUS

## 2014-12-23 MED ORDER — KETOROLAC TROMETHAMINE 30 MG/ML IJ SOLN
30.0000 mg | Freq: Once | INTRAMUSCULAR | Status: AC
  Administered 2014-12-23: 30 mg via INTRAVENOUS
  Filled 2014-12-23: qty 1

## 2014-12-23 MED ORDER — ONDANSETRON HCL 4 MG/2ML IJ SOLN
4.0000 mg | Freq: Once | INTRAMUSCULAR | Status: AC
  Administered 2014-12-23: 4 mg via INTRAVENOUS
  Filled 2014-12-23: qty 2

## 2014-12-23 MED ORDER — IOHEXOL 300 MG/ML  SOLN
100.0000 mL | Freq: Once | INTRAMUSCULAR | Status: AC | PRN
  Administered 2014-12-23: 100 mL via INTRAVENOUS

## 2014-12-23 NOTE — ED Notes (Signed)
After 1 L of NS, pt no longer clammy or pale. Appears to feel better. Reports pain, however reassured that is expected. Pt ready for discharge.
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
## 2014-12-23 NOTE — ED Notes (Signed)
Assisting Holley, RN with helping the patient from the wheelchair to his vehicle
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
## 2014-12-23 NOTE — ED Notes (Signed)
Pt requested ginger ale and water to drink and given both.
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
## 2014-12-23 NOTE — ED Notes (Signed)
Assisting Pete GlatterHolley, RN and [place], [place] with getting patient up off of stretcher and into a wheelchair for discharge

## 2014-12-23 NOTE — ED Notes (Signed)
Pt stated that he is still in pain 9/10 in his back after 4mg  of morphine. Dr Judd Lienelo notified and ordered more pain medicine

## 2014-12-23 NOTE — ED Notes (Signed)
Upon entering room, pt diaphoretic and clammy. BP 102/67. Rechecked and BP 90/70. MD Delo made aware. Ordered to give 1 L NS Bolus and re-assess.

## 2014-12-23 NOTE — ED Notes (Signed)
Pt wheeled to vehicle and assisted to vehicle, ambulatory with steady gait. Pt father taking pt home. VSS prior to departure. Education given to pt regarding incentive spirometry and importance of skill. Pt demonstrated use of incentive spirometer. Pt educated on importance of deep breathing and resting but also trying not to stay in bed for long periods of time. Pt verbalizes understanding of these instructions. A/o x4.

## 2014-12-23 NOTE — ED Notes (Signed)
Attempted to get pt up out of bed again. Pt stood to his feet and put pants on. Pt got diapharetic and stated complained of left sided back pain. Pt assisted back to bed. BP 96/65, HR 90. Pt denies dizziness, states that he is in pain.

## 2014-12-23 NOTE — ED Notes (Signed)
In room with pt to assist him to ambulate. Pt began to get nauseous when standing up. Dr Judd Lienelo notified and ordered to give zofran.

## 2014-12-28 ENCOUNTER — Encounter: Payer: Self-pay | Admitting: Family

## 2014-12-28 ENCOUNTER — Ambulatory Visit (INDEPENDENT_AMBULATORY_CARE_PROVIDER_SITE_OTHER): Payer: Self-pay | Admitting: Family

## 2014-12-28 VITALS — BP 148/98 | HR 83 | Temp 98.3°F | Resp 18 | Ht 72.0 in | Wt 186.0 lb

## 2014-12-28 DIAGNOSIS — S27329D Contusion of lung, unspecified, subsequent encounter: Secondary | ICD-10-CM

## 2014-12-28 DIAGNOSIS — K5903 Drug induced constipation: Secondary | ICD-10-CM | POA: Insufficient documentation

## 2014-12-28 DIAGNOSIS — S27329A Contusion of lung, unspecified, initial encounter: Secondary | ICD-10-CM

## 2014-12-28 DIAGNOSIS — T402X5A Adverse effect of other opioids, initial encounter: Secondary | ICD-10-CM

## 2014-12-28 DIAGNOSIS — M5442 Lumbago with sciatica, left side: Secondary | ICD-10-CM

## 2014-12-28 HISTORY — DX: Contusion of lung, unspecified, initial encounter: S27.329A

## 2014-12-28 HISTORY — DX: Adverse effect of other opioids, initial encounter: T40.2X5A

## 2014-12-28 MED ORDER — OXYCODONE-ACETAMINOPHEN 7.5-325 MG PO TABS: 1.0000 | ORAL_TABLET | Freq: Four times a day (QID) | ORAL | Status: DC | PRN

## 2014-12-28 MED ORDER — METHOCARBAMOL 750 MG PO TABS: 750.0000 mg | ORAL_TABLET | Freq: Four times a day (QID) | ORAL | Status: DC

## 2014-12-28 NOTE — Progress Notes (Signed)
Pre visit review using our clinic review tool, if applicable. No additional management support is needed unless otherwise documented below in the visit note. 
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
## 2014-12-28 NOTE — Assessment & Plan Note (Signed)
Symptoms of constipation consistent with OIC. Start OTC regimens secondary to limited patient budget. Encouraged to remain on laxative medication while on opioids. Follow up if unable to have a bowel movement.
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
## 2014-12-28 NOTE — Patient Instructions (Addendum)
Thank you for choosing [facility].  Summary/Instructions:  Your prescription(s) have been submitted to your pharmacy or been printed and provided for you. Please take as directed and contact our office if you believe you are having problem(s) with the medication(s) or have any questions.  If your symptoms worsen or fail to improve, please contact our office for further instruction, or in case of emergency go directly to the emergency room at the closest medical facility.   Low Back Strain With Rehab A strain is an injury in which a tendon or muscle is torn. The muscles and tendons of the lower back are vulnerable to strains. However, these muscles and tendons are very strong and require a great force to be injured. Strains are classified into three categories. Grade 1 strains cause pain, but the tendon is not lengthened. Grade 2 strains include a lengthened ligament, due to the ligament being stretched or partially ruptured. With grade 2 strains there is still function, although the function may be decreased. Grade 3 strains involve a complete tear of the tendon or muscle, and function is usually impaired. SYMPTOMS   Pain in the lower back.  Pain that affects one side more than the other.  Pain that gets worse with movement and may be felt in the hip, buttocks, or back of the thigh.  Muscle spasms of the muscles in the back.  Swelling along the muscles of the back.  Loss of strength of the back muscles.  Crackling sound (crepitation) when the muscles are touched. CAUSES  Lower back strains occur when a force is placed on the muscles or tendons that is greater than they can handle. Common causes of injury include:  Prolonged overuse of the muscle-tendon units in the lower back, usually from incorrect posture.  A single violent injury or force applied to the back. RISK INCREASES WITH:  Sports that involve twisting forces on the spine or a lot of bending at the waist (football,  rugby, weightlifting, bowling, golf, tennis, speed skating, racquetball, swimming, running, gymnastics, diving).  Poor strength and flexibility.  Failure to warm up properly before activity.  Family history of lower back pain or disk disorders.  Previous back injury or surgery (especially fusion).  Poor posture with lifting, especially heavy objects.  Prolonged sitting, especially with poor posture. PREVENTION   Learn and use proper posture when sitting or lifting (maintain proper posture when sitting, lift using the knees and legs, not at the waist).  Warm up and stretch properly before activity.  Allow for adequate recovery between workouts.  Maintain physical fitness:  Strength, flexibility, and endurance.  Cardiovascular fitness. PROGNOSIS  If treated properly, lower back strains usually heal within 6 weeks. RELATED COMPLICATIONS   Recurring symptoms, resulting in a chronic problem.  Chronic inflammation, scarring, and partial muscle-tendon tear.  Delayed healing or resolution of symptoms.  Prolonged disability. TREATMENT  Treatment first involves the use of ice and medicine, to reduce pain and inflammation. The use of strengthening and stretching exercises may help reduce pain with activity. These exercises may be performed at home or with a therapist. Severe injuries may require referral to a therapist for further evaluation and treatment, such as ultrasound. Your caregiver may advise that you wear a back brace or corset, to help reduce pain and discomfort. Often, prolonged bed rest results in greater harm then benefit. Corticosteroid injections may be recommended. However, these should be reserved for the most serious cases. It is important to avoid using your back when   lifting objects. At night, sleep on your back on a firm mattress with a pillow placed under your knees. If non-surgical treatment is unsuccessful, surgery may be needed.  MEDICATION   If pain medicine  is needed, nonsteroidal anti-inflammatory medicines (aspirin and ibuprofen), or other minor pain relievers (acetaminophen), are often advised.  Do not take pain medicine for 7 days before surgery.  Prescription pain relievers may be given, if your caregiver thinks they are needed. Use only as directed and only as much as you need.  Ointments applied to the skin may be helpful.  Corticosteroid injections may be given by your caregiver. These injections should be reserved for the most serious cases, because they may only be given a certain number of times. HEAT AND COLD  Cold treatment (icing) should be applied for 10 to 15 minutes every 2 to 3 hours for inflammation and pain, and immediately after activity that aggravates your symptoms. Use ice packs or an ice massage.  Heat treatment may be used before performing stretching and strengthening activities prescribed by your caregiver, physical therapist, or athletic trainer. Use a heat pack or a warm water soak. SEEK MEDICAL CARE IF:   Symptoms get worse or do not improve in 2 to 4 weeks, despite treatment.  You develop numbness, weakness, or loss of bowel or bladder function.  New, unexplained symptoms develop. (Drugs used in treatment may produce side effects.) EXERCISES  RANGE OF MOTION (ROM) AND STRETCHING EXERCISES - Low Back Strain Most people with lower back pain will find that their symptoms get worse with excessive bending forward (flexion) or arching at the lower back (extension). The exercises which will help resolve your symptoms will focus on the opposite motion.  Your physician, physical therapist or athletic trainer will help you determine which exercises will be most helpful to resolve your lower back pain. Do not complete any exercises without first consulting with your caregiver. Discontinue any exercises which make your symptoms worse until you speak to your caregiver.  If you have pain, numbness or tingling which travels  down into your buttocks, leg or foot, the goal of the therapy is for these symptoms to move closer to your back and eventually resolve. Sometimes, these leg symptoms will get better, but your lower back pain may worsen. This is typically an indication of progress in your rehabilitation. Be very alert to any changes in your symptoms and the activities in which you participated in the 24 hours prior to the change. Sharing this information with your caregiver will allow him/her to most efficiently treat your condition.  These exercises may help you when beginning to rehabilitate your injury. Your symptoms may resolve with or without further involvement from your physician, physical therapist or athletic trainer. While completing these exercises, remember:  Restoring tissue flexibility helps normal motion to return to the joints. This allows healthier, less painful movement and activity.  An effective stretch should be held for at least 30 seconds.  A stretch should never be painful. You should only feel a gentle lengthening or release in the stretched tissue. FLEXION RANGE OF MOTION AND STRETCHING EXERCISES: STRETCH - Flexion, Single Knee to Chest   Lie on a firm bed or floor with both legs extended in front of you.  Keeping one leg in contact with the floor, bring your opposite knee to your chest. Hold your leg in place by either grabbing behind your thigh or at your knee.  Pull until you feel a gentle stretch in your   lower back. Hold __________ seconds.  Slowly release your grasp and repeat the exercise with the opposite side. Repeat __________ times. Complete this exercise __________ times per day.  STRETCH - Flexion, Double Knee to Chest   Lie on a firm bed or floor with both legs extended in front of you.  Keeping one leg in contact with the floor, bring your opposite knee to your chest.  Tense your stomach muscles to support your back and then lift your other knee to your chest. Hold  your legs in place by either grabbing behind your thighs or at your knees.  Pull both knees toward your chest until you feel a gentle stretch in your lower back. Hold __________ seconds.  Tense your stomach muscles and slowly return one leg at a time to the floor. Repeat __________ times. Complete this exercise __________ times per day.  STRETCH - Low Trunk Rotation  Lie on a firm bed or floor. Keeping your legs in front of you, bend your knees so they are both pointed toward the ceiling and your feet are flat on the floor.  Extend your arms out to the side. This will stabilize your upper body by keeping your shoulders in contact with the floor.  Gently and slowly drop both knees together to one side until you feel a gentle stretch in your lower back. Hold for __________ seconds.  Tense your stomach muscles to support your lower back as you bring your knees back to the starting position. Repeat the exercise to the other side. Repeat __________ times. Complete this exercise __________ times per day  EXTENSION RANGE OF MOTION AND FLEXIBILITY EXERCISES: STRETCH - Extension, Prone on Elbows   Lie on your stomach on the floor, a bed will be too soft. Place your palms about shoulder width apart and at the height of your head.  Place your elbows under your shoulders. If this is too painful, stack pillows under your chest.  Allow your body to relax so that your hips drop lower and make contact more completely with the floor.  Hold this position for __________ seconds.  Slowly return to lying flat on the floor. Repeat __________ times. Complete this exercise __________ times per day.  RANGE OF MOTION - Extension, Prone Press Ups  Lie on your stomach on the floor, a bed will be too soft. Place your palms about shoulder width apart and at the height of your head.  Keeping your back as relaxed as possible, slowly straighten your elbows while keeping your hips on the floor. You may adjust the  placement of your hands to maximize your comfort. As you gain motion, your hands will come more underneath your shoulders.  Hold this position __________ seconds.  Slowly return to lying flat on the floor. Repeat __________ times. Complete this exercise __________ times per day.  RANGE OF MOTION- Quadruped, Neutral Spine   Assume a hands and knees position on a firm surface. Keep your hands under your shoulders and your knees under your hips. You may place padding under your knees for comfort.  Drop your head and point your tail bone toward the ground below you. This will round out your lower back like an angry cat. Hold this position for __________ seconds.  Slowly lift your head and release your tail bone so that your back sags into a large arch, like an old horse.  Hold this position for __________ seconds.  Repeat this until you feel limber in your lower back.  Now,   find your "sweet spot." This will be the most comfortable position somewhere between the two previous positions. This is your neutral spine. Once you have found this position, tense your stomach muscles to support your lower back.  Hold this position for __________ seconds. Repeat __________ times. Complete this exercise __________ times per day.  STRENGTHENING EXERCISES - Low Back Strain These exercises may help you when beginning to rehabilitate your injury. These exercises should be done near your "sweet spot." This is the neutral, low-back arch, somewhere between fully rounded and fully arched, that is your least painful position. When performed in this safe range of motion, these exercises can be used for people who have either a flexion or extension based injury. These exercises may resolve your symptoms with or without further involvement from your physician, physical therapist or athletic trainer. While completing these exercises, remember:   Muscles can gain both the endurance and the strength needed for everyday  activities through controlled exercises.  Complete these exercises as instructed by your physician, physical therapist or athletic trainer. Increase the resistance and repetitions only as guided.  You may experience muscle soreness or fatigue, but the pain or discomfort you are trying to eliminate should never worsen during these exercises. If this pain does worsen, stop and make certain you are following the directions exactly. If the pain is still present after adjustments, discontinue the exercise until you can discuss the trouble with your caregiver. STRENGTHENING - Deep Abdominals, Pelvic Tilt  Lie on a firm bed or floor. Keeping your legs in front of you, bend your knees so they are both pointed toward the ceiling and your feet are flat on the floor.  Tense your lower abdominal muscles to press your lower back into the floor. This motion will rotate your pelvis so that your tail bone is scooping upwards rather than pointing at your feet or into the floor.  With a gentle tension and even breathing, hold this position for __________ seconds. Repeat __________ times. Complete this exercise __________ times per day.  STRENGTHENING - Abdominals, Crunches   Lie on a firm bed or floor. Keeping your legs in front of you, bend your knees so they are both pointed toward the ceiling and your feet are flat on the floor. Cross your arms over your chest.  Slightly tip your chin down without bending your neck.  Tense your abdominals and slowly lift your trunk high enough to just clear your shoulder blades. Lifting higher can put excessive stress on the lower back and does not further strengthen your abdominal muscles.  Control your return to the starting position. Repeat __________ times. Complete this exercise __________ times per day.  STRENGTHENING - Quadruped, Opposite UE/LE Lift   Assume a hands and knees position on a firm surface. Keep your hands under your shoulders and your knees under your  hips. You may place padding under your knees for comfort.  Find your neutral spine and gently tense your abdominal muscles so that you can maintain this position. Your shoulders and hips should form a rectangle that is parallel with the floor and is not twisted.  Keeping your trunk steady, lift your right hand no higher than your shoulder and then your left leg no higher than your hip. Make sure you are not holding your breath. Hold this position __________ seconds.  Continuing to keep your abdominal muscles tense and your back steady, slowly return to your starting position. Repeat with the opposite arm and leg. Repeat __________   times. Complete this exercise __________ times per day.  STRENGTHENING - Lower Abdominals, Double Knee Lift  Lie on a firm bed or floor. Keeping your legs in front of you, bend your knees so they are both pointed toward the ceiling and your feet are flat on the floor.  Tense your abdominal muscles to brace your lower back and slowly lift both of your knees until they come over your hips. Be certain not to hold your breath.  Hold __________ seconds. Using your abdominal muscles, return to the starting position in a slow and controlled manner. Repeat __________ times. Complete this exercise __________ times per day.  POSTURE AND BODY MECHANICS CONSIDERATIONS - Low Back Strain Keeping correct posture when sitting, standing or completing your activities will reduce the stress put on different body tissues, allowing injured tissues a chance to heal and limiting painful experiences. The following are general guidelines for improved posture. Your physician or physical therapist will provide you with any instructions specific to your needs. While reading these guidelines, remember:  The exercises prescribed by your provider will help you have the flexibility and strength to maintain correct postures.  The correct posture provides the best environment for your joints to work.  All of your joints have less wear and tear when properly supported by a spine with good posture. This means you will experience a healthier, less painful body.  Correct posture must be practiced with all of your activities, especially prolonged sitting and standing. Correct posture is as important when doing repetitive low-stress activities (typing) as it is when doing a single heavy-load activity (lifting). RESTING POSITIONS Consider which positions are most painful for you when choosing a resting position. If you have pain with flexion-based activities (sitting, bending, stooping, squatting), choose a position that allows you to rest in a less flexed posture. You would want to avoid curling into a fetal position on your side. If your pain worsens with extension-based activities (prolonged standing, working overhead), avoid resting in an extended position such as sleeping on your stomach. Most people will find more comfort when they rest with their spine in a more neutral position, neither too rounded nor too arched. Lying on a non-sagging bed on your side with a pillow between your knees, or on your back with a pillow under your knees will often provide some relief. Keep in mind, being in any one position for a prolonged period of time, no matter how correct your posture, can still lead to stiffness. PROPER SITTING POSTURE In order to minimize stress and discomfort on your spine, you must sit with correct posture. Sitting with good posture should be effortless for a healthy body. Returning to good posture is a gradual process. Many people can work toward this most comfortably by using various supports until they have the flexibility and strength to maintain this posture on their own. When sitting with proper posture, your ears will fall over your shoulders and your shoulders will fall over your hips. You should use the back of the chair to support your upper back. Your lower back will be in a neutral  position, just slightly arched. You may place a small pillow or folded towel at the base of your lower back for support.  When working at a desk, create an environment that supports good, upright posture. Without extra support, muscles tire, which leads to excessive strain on joints and other tissues. Keep these recommendations in mind: CHAIR:  A chair should be able to slide under your   desk when your back makes contact with the back of the chair. This allows you to work closely.  The chair's height should allow your eyes to be level with the upper part of your monitor and your hands to be slightly lower than your elbows. BODY POSITION  Your feet should make contact with the floor. If this is not possible, use a foot rest.  Keep your ears over your shoulders. This will reduce stress on your neck and lower back. INCORRECT SITTING POSTURES  If you are feeling tired and unable to assume a healthy sitting posture, do not slouch or slump. This puts excessive strain on your back tissues, causing more damage and pain. Healthier options include:  Using more support, like a lumbar pillow.  Switching tasks to something that requires you to be upright or walking.  Talking a brief walk.  Lying down to rest in a neutral-spine position. PROLONGED STANDING WHILE SLIGHTLY LEANING FORWARD  When completing a task that requires you to lean forward while standing in one place for a long time, place either foot up on a stationary 2-4 inch high object to help maintain the best posture. When both feet are on the ground, the lower back tends to lose its slight inward curve. If this curve flattens (or becomes too large), then the back and your other joints will experience too much stress, tire more quickly, and can cause pain. CORRECT STANDING POSTURES Proper standing posture should be assumed with all daily activities, even if they only take a few moments, like when brushing your teeth. As in sitting, your ears  should fall over your shoulders and your shoulders should fall over your hips. You should keep a slight tension in your abdominal muscles to brace your spine. Your tailbone should point down to the ground, not behind your body, resulting in an over-extended swayback posture.  INCORRECT STANDING POSTURES  Common incorrect standing postures include a forward head, locked knees and/or an excessive swayback. WALKING Walk with an upright posture. Your ears, shoulders and hips should all line-up. PROLONGED ACTIVITY IN A FLEXED POSITION When completing a task that requires you to bend forward at your waist or lean over a low surface, try to find a way to stabilize 3 out of 4 of your limbs. You can place a hand or elbow on your thigh or rest a knee on the surface you are reaching across. This will provide you more stability so that your muscles do not fatigue as quickly. By keeping your knees relaxed, or slightly bent, you will also reduce stress across your lower back. CORRECT LIFTING TECHNIQUES DO :   Assume a wide stance. This will provide you more stability and the opportunity to get as close as possible to the object which you are lifting.  Tense your abdominals to brace your spine. Bend at the knees and hips. Keeping your back locked in a neutral-spine position, lift using your leg muscles. Lift with your legs, keeping your back straight.  Test the weight of unknown objects before attempting to lift them.  Try to keep your elbows locked down at your sides in order get the best strength from your shoulders when carrying an object.  Always ask for help when lifting heavy or awkward objects. INCORRECT LIFTING TECHNIQUES DO NOT:   Lock your knees when lifting, even if it is a small object.  Bend and twist. Pivot at your feet or move your feet when needing to change directions.  Assume that you   can safely pick up even a paper clip without proper posture.   This information is not intended to  replace advice given to you by your health care provider. Make sure you discuss any questions you have with your health care provider.   Document Released: 02/11/2005 Document Revised: 03/04/2014 Document Reviewed: 05/26/2008 [employer] Interactive Patient Education 2016 [employer]   

## 2014-12-28 NOTE — Assessment & Plan Note (Signed)
There is mild left chest tenderness with lung contusion noted on CT. Continue with pain management as needed. Encouraged to continue incentive spirometery and coughing to prevent PNA. Splint chest as needed. Follow up if symptoms worsen or fail to improve.

## 2015-03-21 ENCOUNTER — Other Ambulatory Visit (INDEPENDENT_AMBULATORY_CARE_PROVIDER_SITE_OTHER): Payer: Self-pay

## 2015-03-21 ENCOUNTER — Ambulatory Visit (INDEPENDENT_AMBULATORY_CARE_PROVIDER_SITE_OTHER)
Admission: RE | Admit: 2015-03-21 | Discharge: 2015-03-21 | Disposition: A | Discharge status: Home / Self Care | Payer: Self-pay | Source: Ambulatory Visit | Attending: Internal Medicine | Admitting: Internal Medicine

## 2015-03-21 ENCOUNTER — Encounter: Payer: Self-pay | Admitting: Internal Medicine

## 2015-03-21 ENCOUNTER — Ambulatory Visit (INDEPENDENT_AMBULATORY_CARE_PROVIDER_SITE_OTHER): Payer: Self-pay | Admitting: Internal Medicine

## 2015-03-21 VITALS — BP 120/78 | HR 74 | Temp 97.9°F | Resp 14 | Ht 72.0 in | Wt 183.0 lb

## 2015-03-21 DIAGNOSIS — R0781 Pleurodynia: Secondary | ICD-10-CM

## 2015-03-21 DIAGNOSIS — Z7251 High risk heterosexual behavior: Secondary | ICD-10-CM

## 2015-03-21 DIAGNOSIS — Z113 Encounter for screening for infections with a predominantly sexual mode of transmission: Secondary | ICD-10-CM

## 2015-03-21 HISTORY — DX: Pleurodynia: R07.81

## 2015-03-21 LAB — URINALYSIS WITH CULTURE, IF INDICATED
Bilirubin Urine: NEGATIVE
Ketones, ur: NEGATIVE
Leukocytes, UA: NEGATIVE
Nitrite: NEGATIVE
Specific Gravity, Urine: 1.025 (ref 1.000–1.030)
Total Protein, Urine: 30 — AB
URINE GLUCOSE: NEGATIVE
Urobilinogen, UA: 0.2 (ref 0.0–1.0)
pH: 6.5 (ref 5.0–8.0)

## 2015-03-21 NOTE — Patient Instructions (Signed)
We are checking you for STDs today including HIV with a blood and urine test.   We are checking the x-ray of the ribs to make sure nothing is broken.   We will treat you based on the results of the tests.

## 2015-03-21 NOTE — Assessment & Plan Note (Signed)
Checking x-ray ribs to rule out fracture although it seems unlikely. No pain with respiration.
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
## 2015-03-21 NOTE — Progress Notes (Signed)
Pre visit review using our clinic review tool, if applicable. No additional management support is needed unless otherwise documented below in the visit note. 

## 2015-03-21 NOTE — Assessment & Plan Note (Signed)
Checking HIV, GC/chlamydia and U/A. Advised on the merits of safe sex to help protect against STDs.

## 2015-03-22 LAB — GC/CHLAMYDIA PROBE AMP
CT PROBE, AMP APTIMA: NOT DETECTED
GC PROBE AMP APTIMA: NOT DETECTED

## 2015-03-22 LAB — HIV ANTIBODY (ROUTINE TESTING W REFLEX): HIV: NONREACTIVE

## 2015-03-28 ENCOUNTER — Ambulatory Visit (INDEPENDENT_AMBULATORY_CARE_PROVIDER_SITE_OTHER): Payer: Self-pay | Admitting: Family

## 2015-03-28 ENCOUNTER — Encounter: Payer: Self-pay | Admitting: Family

## 2015-03-28 VITALS — BP 110/72 | HR 75 | Temp 97.9°F | Resp 18 | Ht 72.0 in | Wt 187.0 lb

## 2015-03-28 DIAGNOSIS — R0781 Pleurodynia: Secondary | ICD-10-CM

## 2015-03-28 MED ORDER — TRAMADOL HCL 50 MG PO TABS: 50.0000 mg | ORAL_TABLET | Freq: Three times a day (TID) | ORAL | Status: DC | PRN

## 2015-03-28 NOTE — Assessment & Plan Note (Signed)
Continues to experience rib pain around the 6-7th rib at the junction with the sternum. Question possible sprain with no evidence of fracture on x-rays. Offered to refer to orthopedics, however patient declined. Start Tramadol as needed for pain. Follow up if symptoms worsen or further care is requested.

## 2015-03-28 NOTE — Progress Notes (Signed)
Pre visit review using our clinic review tool, if applicable. No additional management support is needed unless otherwise documented below in the visit note. 
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
## 2015-03-28 NOTE — Patient Instructions (Signed)
Thank you for choosing [facility].  Summary/Instructions:  Your prescription(s) have been submitted to your pharmacy or been printed and provided for you. Please take as directed and contact our office if you believe you are having problem(s) with the medication(s) or have any questions.  If your symptoms worsen or fail to improve, please contact our office for further instruction, or in case of emergency go directly to the emergency room at the closest medical facility.     

## 2015-08-16 ENCOUNTER — Encounter: Payer: Self-pay | Admitting: Family

## 2015-08-16 ENCOUNTER — Other Ambulatory Visit: Payer: Self-pay

## 2015-08-16 ENCOUNTER — Ambulatory Visit (INDEPENDENT_AMBULATORY_CARE_PROVIDER_SITE_OTHER): Payer: Self-pay | Admitting: Family

## 2015-08-16 VITALS — BP 128/78 | HR 66 | Temp 97.8°F | Resp 16 | Ht 72.0 in | Wt 190.0 lb

## 2015-08-16 DIAGNOSIS — R300 Dysuria: Secondary | ICD-10-CM

## 2015-08-16 LAB — WET PREP, GENITAL
CLUE CELLS WET PREP: NONE SEEN — AB
Yeast Wet Prep HPF POC: NONE SEEN — AB

## 2015-08-16 LAB — POCT URINALYSIS DIPSTICK
BILIRUBIN UA: NEGATIVE
Glucose, UA: NEGATIVE
Ketones, UA: NEGATIVE
LEUKOCYTES UA: NEGATIVE
Nitrite, UA: NEGATIVE
PH UA: 6
Protein, UA: NEGATIVE
Spec Grav, UA: >=1.03
Urobilinogen, UA: NEGATIVE

## 2015-08-16 MED ORDER — METRONIDAZOLE 500 MG PO TABS: 500.0000 mg | ORAL_TABLET | Freq: Two times a day (BID) | ORAL | Status: DC

## 2015-08-16 MED ORDER — AZITHROMYCIN 250 MG PO TABS: ORAL_TABLET | ORAL | Status: DC

## 2015-08-16 MED ORDER — CEFTRIAXONE SODIUM 1 G IJ SOLR
1.0000 g | Freq: Once | INTRAMUSCULAR | Status: AC
  Administered 2015-08-16: 1 g via INTRAMUSCULAR

## 2015-08-16 NOTE — Patient Instructions (Signed)
Thank you for choosing [facility].  Summary/Instructions:  Your prescription(s) have been submitted to your pharmacy or been printed and provided for you. Please take as directed and contact our office if you believe you are having problem(s) with the medication(s) or have any questions.  Please stop by the lab on the basement level of the building for your blood work. Your results will be released to MyChart (or called to you) after review, usually within 72 hours after test completion. If any changes need to be made, you will be notified at that same time.  If your symptoms worsen or fail to improve, please contact our office for further instruction, or in case of emergency go directly to the emergency room at the closest medical facility.   Sexually Transmitted Disease A sexually transmitted disease (STD) is a disease or infection that may be passed (transmitted) from person to person, usually during sexual activity. This may happen by way of saliva, semen, blood, vaginal mucus, or urine. Common STDs include:  Gonorrhea.  Chlamydia.  Syphilis.  HIV and AIDS.  Genital herpes.  Hepatitis B and C.  Trichomonas.  Human papillomavirus (HPV).  Pubic lice.  Scabies.  Mites.  Bacterial vaginosis. WHAT ARE CAUSES OF STDs? An STD may be caused by bacteria, a virus, or parasites. STDs are often transmitted during sexual activity if one person is infected. However, they may also be transmitted through nonsexual means. STDs may be transmitted after:   Sexual intercourse with an infected person.  Sharing sex toys with an infected person.  Sharing needles with an infected person or using unclean piercing or tattoo needles.  Having intimate contact with the genitals, mouth, or rectal areas of an infected person.  Exposure to infected fluids during birth. WHAT ARE THE SIGNS AND SYMPTOMS OF STDs? Different STDs have different symptoms. Some people may not have any symptoms.  If symptoms are present, they may include:  Painful or bloody urination.  Pain in the pelvis, abdomen, vagina, anus, throat, or eyes.  A skin rash, itching, or irritation.  Growths, ulcerations, blisters, or sores in the genital and anal areas.  Abnormal vaginal discharge with or without bad odor.  Penile discharge in men.  Fever.  Pain or bleeding during sexual intercourse.  Swollen glands in the groin area.  Yellow skin and eyes (jaundice). This is seen with hepatitis.  Swollen testicles.  Infertility.  Sores and blisters in the mouth. HOW ARE STDs DIAGNOSED? To make a diagnosis, your health care provider may:  Take a medical history.  Perform a physical exam.  Take a sample of any discharge to examine.  Swab the throat, cervix, opening to the penis, rectum, or vagina for testing.  Test a sample of your first morning urine.  Perform blood tests.  Perform a Pap test, if this applies.  Perform a colposcopy.  Perform a laparoscopy. HOW ARE STDs TREATED? Treatment depends on the STD. Some STDs may be treated but not cured.  Chlamydia, gonorrhea, trichomonas, and syphilis can be cured with antibiotic medicine.  Genital herpes, hepatitis, and HIV can be treated, but not cured, with prescribed medicines. The medicines lessen symptoms.  Genital warts from HPV can be treated with medicine or by freezing, burning (electrocautery), or surgery. Warts may come back.  HPV cannot be cured with medicine or surgery. However, abnormal areas may be removed from the cervix, vagina, or vulva.  If your diagnosis is confirmed, your recent sexual partners need treatment. This is true even if   they are symptom-free or have a negative culture or evaluation. They should not have sex until their health care providers say it is okay.  Your health care provider may test you for infection again 3 months after treatment. HOW CAN I REDUCE MY RISK OF GETTING AN STD? Take these steps to  reduce your risk of getting an STD:  Use latex condoms, dental dams, and water-soluble lubricants during sexual activity. Do not use petroleum jelly or oils.  Avoid having multiple sex partners.  Do not have sex with someone who has other sex partners  Do not have sex with anyone you do not know or who is at high risk for an STD.  Avoid risky sex practices that can break your skin.  Do not have sex if you have open sores on your mouth or skin.  Avoid drinking too much alcohol or taking illegal drugs. Alcohol and drugs can affect your judgment and put you in a vulnerable position.  Avoid engaging in oral and anal sex acts.  Get vaccinated for HPV and hepatitis. If you have not received these vaccines in the past, talk to your health care provider about whether one or both might be right for you.  If you are at risk of being infected with HIV, it is recommended that you take a prescription medicine daily to prevent HIV infection. This is called pre-exposure prophylaxis (PrEP). You are considered at risk if:  You are a man who has sex with other men (MSM).  You are a heterosexual man or woman and are sexually active with more than one partner.  You take drugs by injection.  You are sexually active with a partner who has HIV.  Talk with your health care provider about whether you are at high risk of being infected with HIV. If you choose to begin PrEP, you should first be tested for HIV. You should then be tested every 3 months for as long as you are taking PrEP. WHAT SHOULD I DO IF I THINK I HAVE AN STD?  See your health care provider.  Tell your sexual partner(s). They should be tested and treated for any STDs.  Do not have sex until your health care provider says it is okay. WHEN SHOULD I GET IMMEDIATE MEDICAL CARE? Contact your health care provider right away if:   You have severe abdominal pain.  You are a man and notice swelling or pain in your testicles.  You are a  woman and notice swelling or pain in your vagina.   This information is not intended to replace advice given to you by your health care provider. Make sure you discuss any questions you have with your health care provider.   Document Released: 05/04/2002 Document Revised: 03/04/2014 Document Reviewed: 09/01/2012 Elsevier Interactive Patient Education 2016 [employer]  

## 2015-08-16 NOTE — Progress Notes (Signed)
Pre visit review using our clinic review tool, if applicable. No additional management support is needed unless otherwise documented below in the visit note. 
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
## 2015-08-16 NOTE — Assessment & Plan Note (Signed)
Dysuria and penile discharge with concern for STI. In office UA is positive for leukocytes and negative for hematuria and nitrites. Obtain GC/chlamydia probe, HIV, RPR, urine culture and wet prep. In office injection of ceftriaxone given. Start Azithromycin and metronidazole. Follow up if pending lab results. Discussed importance of protection to prevent future exposure to STI.

## 2015-08-17 ENCOUNTER — Telehealth: Payer: Self-pay | Admitting: Family

## 2015-08-17 LAB — GC/CHLAMYDIA PROBE AMP
CT PROBE, AMP APTIMA: NOT DETECTED
GC PROBE AMP APTIMA: NOT DETECTED

## 2015-08-17 LAB — HIV ANTIBODY (ROUTINE TESTING W REFLEX): HIV: NONREACTIVE

## 2015-08-17 LAB — RPR

## 2015-08-17 NOTE — Telephone Encounter (Signed)
Please inform patient that he was negative for gonnerhea, chlamydia, HIV and syphilis. His trichamonas test was positive which should be covered by the metronidazole. Follow up if symptoms do not improve.

## 2015-08-18 LAB — URINE CULTURE
COLONY COUNT: NO GROWTH
Organism ID, Bacteria: NO GROWTH

## 2015-08-18 NOTE — Telephone Encounter (Signed)
Pt aware of results 

## 2015-11-06 ENCOUNTER — Other Ambulatory Visit: Payer: Self-pay

## 2015-11-06 ENCOUNTER — Encounter: Payer: Self-pay | Admitting: Family

## 2015-11-06 ENCOUNTER — Ambulatory Visit (INDEPENDENT_AMBULATORY_CARE_PROVIDER_SITE_OTHER): Payer: Self-pay | Admitting: Family

## 2015-11-06 VITALS — BP 118/80 | HR 81 | Temp 98.3°F | Resp 14 | Ht 72.0 in | Wt 187.0 lb

## 2015-11-06 DIAGNOSIS — R0981 Nasal congestion: Secondary | ICD-10-CM

## 2015-11-06 DIAGNOSIS — R300 Dysuria: Secondary | ICD-10-CM

## 2015-11-06 DIAGNOSIS — Z7251 High risk heterosexual behavior: Secondary | ICD-10-CM

## 2015-11-06 HISTORY — DX: Nasal congestion: R09.81

## 2015-11-06 MED ORDER — CEFTRIAXONE SODIUM 500 MG IJ SOLR
500.0000 mg | Freq: Once | INTRAMUSCULAR | Status: AC
  Administered 2015-11-06: 500 mg via INTRAMUSCULAR

## 2015-11-06 MED ORDER — AZITHROMYCIN 250 MG PO TABS: ORAL_TABLET | ORAL | 0 refills | Status: DC

## 2015-11-06 MED ORDER — CYANOCOBALAMIN 1000 MCG/ML IJ SOLN: 1000.0000 ug | Freq: Once | INTRAMUSCULAR | Status: DC

## 2015-11-06 MED ORDER — METRONIDAZOLE 500 MG PO TABS: 500.0000 mg | ORAL_TABLET | Freq: Two times a day (BID) | ORAL | 0 refills | Status: DC

## 2015-11-06 NOTE — Assessment & Plan Note (Signed)
Continues to experience waxing and waning dysuria and has had unprotected intercourse. No penile discharge. No evidence of prostatitis. In office injection of rocephin provided. Start metronidazole and azithromycin. Obtain RPR, HIV and GC/Chlamydia probe.

## 2015-11-06 NOTE — Assessment & Plan Note (Signed)
Symptoms and exam consistent with upper respiratory infection. Treat conservatively with over-the-counter medications as needed for symptom relief and supportive care. Any bacterial infection most likely covered by previously prescribed antibiotics. Follow-up if symptoms worsen or do not improve.

## 2015-11-06 NOTE — Patient Instructions (Signed)
Thank you for choosing [facility].  SUMMARY AND INSTRUCTIONS:  Medication:  Your prescription(s) have been submitted to your pharmacy or been printed and provided for you. Please take as directed and contact our office if you believe you are having problem(s) with the medication(s) or have any questions.  Labs:  Please stop by the lab on the lower level of the building for your blood work. Your results will be released to MyChart (or called to you) after review, usually within 72 hours after test completion. If any changes need to be made, you will be notified at that same time.  1.) The lab is open from 7:30am to 5:30 pm Monday-Friday 2.) No appointment is necessary 3.) Fasting (if needed) is 6-8 hours after food and drink; black coffee and water are okay   Follow up:  If your symptoms worsen or fail to improve, please contact our office for further instruction, or in case of emergency go directly to the emergency room at the closest medical facility.    General Recommendations:    Please drink plenty of fluids.  Get plenty of rest   Sleep in humidified air  Use saline nasal sprays  Netti pot   OTC Medications:  Decongestants - helps relieve congestion   Flonase (generic fluticasone) or Nasacort (generic triamcinolone) - please make sure to use the "cross-over" technique at a 45 degree angle towards the opposite eye as opposed to straight up the nasal passageway.   Sudafed (generic pseudoephedrine - Note this is the one that is available behind the pharmacy counter); Products with phenylephrine (-PE) may also be used but is often not as effective as pseudoephedrine.   If you have HIGH BLOOD PRESSURE - Coricidin HBP; AVOID any product that is -D as this contains pseudoephedrine which may increase your blood pressure.  Afrin (oxymetazoline) every 6-8 hours for up to 3 days.   Allergies - helps relieve runny nose, itchy eyes and sneezing   Claritin (generic  loratidine), Allegra (fexofenidine), or Zyrtec (generic cyrterizine) for runny nose. These medications should not cause drowsiness.  Note - Benadryl (generic diphenhydramine) may be used however may cause drowsiness  Cough -   Delsym or Robitussin (generic dextromethorphan)  Expectorants - helps loosen mucus to ease removal   Mucinex (generic guaifenesin) as directed on the package.  Headaches / General Aches   Tylenol (generic acetaminophen) - DO NOT EXCEED 3 grams (3,000 mg) in a 24 hour time period  Advil/Motrin (generic ibuprofen)   Sore Throat -   Salt water gargle   Chloraseptic (generic benzocaine) spray or lozenges / Sucrets (generic dyclonine)    Sinusitis Sinusitis is redness, soreness, and inflammation of the paranasal sinuses. Paranasal sinuses are air pockets within the bones of your face (beneath the eyes, the middle of the forehead, or above the eyes). In healthy paranasal sinuses, mucus is able to drain out, and air is able to circulate through them by way of your nose. However, when your paranasal sinuses are inflamed, mucus and air can become trapped. This can allow bacteria and other germs to grow and cause infection. Sinusitis can develop quickly and last only a short time (acute) or continue over a long period (chronic). Sinusitis that lasts for more than 12 weeks is considered chronic.  CAUSES  Causes of sinusitis include:  Allergies.  Structural abnormalities, such as displacement of the cartilage that separates your nostrils (deviated septum), which can decrease the air flow through your nose and sinuses and affect sinus   drainage.  Functional abnormalities, such as when the small hairs (cilia) that line your sinuses and help remove mucus do not work properly or are not present. SIGNS AND SYMPTOMS  Symptoms of acute and chronic sinusitis are the same. The primary symptoms are pain and pressure around the affected sinuses. Other symptoms  include:  Upper toothache.  Earache.  Headache.  Bad breath.  Decreased sense of smell and taste.  A cough, which worsens when you are lying flat.  Fatigue.  Fever.  Thick drainage from your nose, which often is green and may contain pus (purulent).  Swelling and warmth over the affected sinuses. DIAGNOSIS  Your health care provider will perform a physical exam. During the exam, your health care provider may:  Look in your nose for signs of abnormal growths in your nostrils (nasal polyps).  Tap over the affected sinus to check for signs of infection.  View the inside of your sinuses (endoscopy) using an imaging device that has a light attached (endoscope). If your health care provider suspects that you have chronic sinusitis, one or more of the following tests may be recommended:  Allergy tests.  Nasal culture. A sample of mucus is taken from your nose, sent to a lab, and screened for bacteria.  Nasal cytology. A sample of mucus is taken from your nose and examined by your health care provider to determine if your sinusitis is related to an allergy. TREATMENT  Most cases of acute sinusitis are related to a viral infection and will resolve on their own within 10 days. Sometimes medicines are prescribed to help relieve symptoms (pain medicine, decongestants, nasal steroid sprays, or saline sprays).  However, for sinusitis related to a bacterial infection, your health care provider will prescribe antibiotic medicines. These are medicines that will help kill the bacteria causing the infection.  Rarely, sinusitis is caused by a fungal infection. In theses cases, your health care provider will prescribe antifungal medicine. For some cases of chronic sinusitis, surgery is needed. Generally, these are cases in which sinusitis recurs more than 3 times per year, despite other treatments. HOME CARE INSTRUCTIONS   Drink plenty of water. Water helps thin the mucus so your sinuses can  drain more easily.  Use a humidifier.  Inhale steam 3 to 4 times a day (for example, sit in the bathroom with the shower running).  Apply a warm, moist washcloth to your face 3 to 4 times a day, or as directed by your health care provider.  Use saline nasal sprays to help moisten and clean your sinuses.  Take medicines only as directed by your health care provider.  If you were prescribed either an antibiotic or antifungal medicine, finish it all even if you start to feel better. SEEK IMMEDIATE MEDICAL CARE IF:  You have increasing pain or severe headaches.  You have nausea, vomiting, or drowsiness.  You have swelling around your face.  You have vision problems.  You have a stiff neck.  You have difficulty breathing. MAKE SURE YOU:   Understand these instructions.  Will watch your condition.  Will get help right away if you are not doing well or get worse. Document Released: 02/11/2005 Document Revised: 06/28/2013 Document Reviewed: 02/26/2011 Exit[employer] Patient Information 2015 [employer]. This information is not intended to replace advice given to you by your health care provider. Make sure you discuss any questions you have with your health care provider.   
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
## 2015-11-06 NOTE — Addendum Note (Signed)
Addended by: Mercer PodWRENN, Esaw Knippel E on: 11/06/2015 01:00 PM   Modules accepted: Orders

## 2015-11-07 LAB — RPR

## 2015-11-07 LAB — GC/CHLAMYDIA PROBE AMP
CT Probe RNA: NOT DETECTED
GC PROBE AMP APTIMA: NOT DETECTED

## 2015-11-07 LAB — HIV ANTIBODY (ROUTINE TESTING W REFLEX): HIV: NONREACTIVE

## 2015-11-17 ENCOUNTER — Emergency Department (HOSPITAL_COMMUNITY)
Admission: EM | Admit: 2015-11-17 | Discharge: 2015-11-17 | Disposition: A | Discharge status: Home / Self Care | Payer: No Typology Code available for payment source | Attending: Emergency Medicine | Admitting: Emergency Medicine

## 2015-11-17 ENCOUNTER — Encounter (HOSPITAL_COMMUNITY): Payer: Self-pay | Admitting: Emergency Medicine

## 2015-11-17 DIAGNOSIS — G43909 Migraine, unspecified, not intractable, without status migrainosus: Secondary | ICD-10-CM

## 2015-11-17 DIAGNOSIS — R51 Headache: Secondary | ICD-10-CM | POA: Diagnosis present

## 2015-11-17 DIAGNOSIS — Z87891 Personal history of nicotine dependence: Secondary | ICD-10-CM | POA: Diagnosis not present

## 2015-11-17 MED ORDER — KETOROLAC TROMETHAMINE 60 MG/2ML IM SOLN: 60.0000 mg | Freq: Once | INTRAMUSCULAR | Status: DC

## 2015-11-17 MED ORDER — METOCLOPRAMIDE HCL 10 MG PO TABS
10.0000 mg | ORAL_TABLET | Freq: Once | ORAL | Status: AC
  Administered 2015-11-17: 10 mg via ORAL
  Filled 2015-11-17: qty 1

## 2015-11-17 MED ORDER — DEXAMETHASONE SODIUM PHOSPHATE 10 MG/ML IJ SOLN
10.0000 mg | Freq: Once | INTRAMUSCULAR | Status: AC
  Administered 2015-11-17: 10 mg via INTRAMUSCULAR
  Filled 2015-11-17: qty 1

## 2015-11-17 MED ORDER — DIPHENHYDRAMINE HCL 25 MG PO CAPS: 50.0000 mg | ORAL_CAPSULE | Freq: Once | ORAL | Status: DC

## 2015-11-17 MED ORDER — ACETAMINOPHEN 500 MG PO TABS
1000.0000 mg | ORAL_TABLET | Freq: Once | ORAL | Status: AC
  Administered 2015-11-17: 1000 mg via ORAL
  Filled 2015-11-17: qty 2

## 2015-11-17 NOTE — ED Triage Notes (Signed)
Pt arrives via [facility] from home with headache that began approx 1 week ago. Denies recent fever. Pt in NAD, VSS. A&Ox4. Smiling.

## 2015-11-17 NOTE — ED Notes (Signed)
Pt is in stable condition upon d/c and ambulates from ED. 

## 2015-11-17 NOTE — ED Notes (Signed)
Pt would not put a gown on. Pt stated that "I know what I need, I just need a cocktail".
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
## 2015-11-17 NOTE — Discharge Instructions (Signed)
If you were given medicines take as directed.  If you are on coumadin or contraceptives realize their levels and effectiveness is altered by many different medicines.  If you have any reaction (rash, tongues swelling, other) to the medicines stop taking and see a physician.    If your blood pressure was elevated in the ER make sure you follow up for management with a primary doctor or return for chest pain, shortness of breath or stroke symptoms.  Please follow up as directed and return to the ER or see a physician for new or worsening symptoms.  Thank you. Vitals:   11/17/15 1452 11/17/15 1628 11/17/15 1713  BP: 115/79 122/74 125/78  Pulse: 85 71 85  Resp: 18 17 18   Temp: 97.8 F (36.6 C)  97.8 F (36.6 C)  TempSrc: Oral  Oral  SpO2: 98% 100%

## 2015-11-19 ENCOUNTER — Emergency Department (HOSPITAL_COMMUNITY)
Admission: EM | Admit: 2015-11-19 | Discharge: 2015-11-19 | Disposition: A | Discharge status: Home / Self Care | Payer: PRIVATE HEALTH INSURANCE | Attending: Emergency Medicine | Admitting: Emergency Medicine

## 2015-11-19 ENCOUNTER — Encounter (HOSPITAL_COMMUNITY): Payer: Self-pay | Admitting: Emergency Medicine

## 2015-11-19 ENCOUNTER — Emergency Department (HOSPITAL_COMMUNITY): Payer: PRIVATE HEALTH INSURANCE

## 2015-11-19 DIAGNOSIS — Z7982 Long term (current) use of aspirin: Secondary | ICD-10-CM | POA: Insufficient documentation

## 2015-11-19 DIAGNOSIS — Z87891 Personal history of nicotine dependence: Secondary | ICD-10-CM | POA: Insufficient documentation

## 2015-11-19 DIAGNOSIS — R519 Headache, unspecified: Secondary | ICD-10-CM

## 2015-11-19 DIAGNOSIS — R51 Headache: Secondary | ICD-10-CM | POA: Insufficient documentation

## 2015-11-19 MED ORDER — SODIUM CHLORIDE 0.9 % IV BOLUS (SEPSIS)
1000.0000 mL | Freq: Once | INTRAVENOUS | Status: AC
  Administered 2015-11-19: 1000 mL via INTRAVENOUS

## 2015-11-19 MED ORDER — KETOROLAC TROMETHAMINE 15 MG/ML IJ SOLN
15.0000 mg | Freq: Once | INTRAMUSCULAR | Status: AC
  Administered 2015-11-19: 15 mg via INTRAVENOUS
  Filled 2015-11-19: qty 1

## 2015-11-19 MED ORDER — METOCLOPRAMIDE HCL 5 MG/ML IJ SOLN
10.0000 mg | Freq: Once | INTRAMUSCULAR | Status: AC
  Administered 2015-11-19: 10 mg via INTRAVENOUS
  Filled 2015-11-19: qty 2

## 2015-11-19 MED ORDER — DIPHENHYDRAMINE HCL 50 MG/ML IJ SOLN
50.0000 mg | Freq: Once | INTRAMUSCULAR | Status: AC
  Administered 2015-11-19: 50 mg via INTRAVENOUS
  Filled 2015-11-19: qty 1
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
## 2015-11-19 NOTE — ED Triage Notes (Signed)
Pt presents to ED for assessment of a headache.  States he "wasn't given the stuff I told them to give me" when he was seen here a few days ago.  Pt sts he is "supposed to get all of those medicines IV."  Pt sts he was given shots and pills 2 days ago when he was seen.  Pt c/o blurred vision, light-sensitivity, and :"pounding" headache.  Denies weakness, denies dizziness.

## 2015-11-19 NOTE — Discharge Instructions (Addendum)
Try excedrin, tylenol and or motrin for headaches.   If you were given medicines take as directed.  If you are on coumadin or contraceptives realize their levels and effectiveness is altered by many different medicines.  If you have any reaction (rash, tongues swelling, other) to the medicines stop taking and see a physician.    If your blood pressure was elevated in the ER make sure you follow up for management with a primary doctor or return for chest pain, shortness of breath or stroke symptoms.  Please follow up as directed and return to the ER or see a physician for new or worsening symptoms.  Thank you. Vitals:   11/19/15 1803 11/19/15 1804 11/19/15 1902  BP: 120/76  112/68  Pulse: (!) 57  62  Resp: 16  12  Temp: 98.4 F (36.9 C)    TempSrc: Oral    SpO2: 100%  100%  Weight:  185 lb (83.9 kg)   Height:  6' (1.829 m)

## 2015-11-28 ENCOUNTER — Telehealth: Payer: Self-pay | Admitting: Family

## 2015-11-29 ENCOUNTER — Encounter: Payer: Self-pay | Admitting: Nurse Practitioner

## 2015-11-29 ENCOUNTER — Ambulatory Visit (INDEPENDENT_AMBULATORY_CARE_PROVIDER_SITE_OTHER): Payer: PRIVATE HEALTH INSURANCE | Admitting: Nurse Practitioner

## 2015-11-29 VITALS — BP 129/88 | HR 88 | Temp 97.8°F | Ht 72.0 in | Wt 187.0 lb

## 2015-11-29 DIAGNOSIS — G44029 Chronic cluster headache, not intractable: Secondary | ICD-10-CM

## 2015-11-29 MED ORDER — SUMATRIPTAN SUCCINATE 25 MG PO TABS: ORAL_TABLET | ORAL | 0 refills | Status: DC

## 2015-11-29 NOTE — Patient Instructions (Addendum)
Maintain upcoming appointment with neurologist on the 29th.  Cluster Headache Cluster headaches are deeply painful. They normally occur on one side of your head, but they may switch sides. Often, cluster headaches:  Are severe.  Happen often for a few weeks or months and then go away for a while.  Last from 15 minutes to 3 hours.  Happen at the same time each day.  Happen at night.  Happen many times a day. HOME CARE  During times when you have cluster headaches:  Get the same amount of sleep every night, at the same time each night.  Avoid alcohol.  Stop smoking if you smoke. GET HELP IF:  There are changes in how bad or how often your headaches happen.  Your medicines are not helping. GET HELP RIGHT AWAY IF:  You pass out (faint).  You become weak or lose feeling (have numbness) on one side of your body or face.  You see two of everything (double vision).  You feel sick to your stomach (nauseous) or throw up (vomit) and do not stop after several hours.  You are off balance or have trouble talking or walking.  You have neck pain or stiffness.  You have a fever. MAKE SURE YOU:  Understand these instructions.  Will watch your condition.  Will get help right away if you are not doing well or get worse.   This information is not intended to replace advice given to you by your health care provider. Make sure you discuss any questions you have with your health care provider.   Document Released: 03/21/2004 Document Revised: 03/04/2014 Document Reviewed: 09/03/2012 Elsevier Interactive Patient Education [employer]2016 [employer] Inc[employer]

## 2015-11-29 NOTE — Progress Notes (Signed)
Subjective:  Patient ID: Bryan Valdez, male    DOB: [birth year]  Age: 50 y.o. MRN: [id number]  CC: Sinus Problem (Pt stated sinus, back of the eyes, and headache are painful for about ver 1 month.)   Migraine   This is a recurrent problem. The current episode started 1 to 4 weeks ago. The problem occurs intermittently. The problem has been waxing and waning. The pain is located in the left unilateral and retro-orbital region. The pain radiates to the left neck and right neck. The pain quality is similar to prior headaches. The quality of the pain is described as band-like, throbbing and sharp. The pain is at a severity of 6/10. The pain is moderate. Associated symptoms include eye watering, rhinorrhea, scalp tenderness and sinus pressure. Pertinent negatives include no anorexia, back pain, blurred vision, coughing, eye pain, eye redness, facial sweating, fever, hearing loss, insomnia, loss of balance, muscle aches, nausea, neck pain, photophobia, sore throat, swollen glands, tingling, tinnitus, visual change, vomiting or weakness. Nothing aggravates the symptoms. He has tried acetaminophen, Excedrin, ketorolac injections, NSAIDs and oral narcotics for the symptoms. The treatment provided mild relief. His past medical history is significant for cluster headaches and migraine headaches. There is no history of hypertension, immunosuppression, migraines in the family, pseudotumor cerebri, recent head traumas or sinus disease.    Outpatient Medications Prior to Visit  Medication Sig Dispense Refill  . methocarbamol (ROBAXIN) 750 MG tablet Take 1 tablet (750 mg total) by mouth 4 (four) times daily. 56 tablet 0  . acetaminophen-codeine (TYLENOL #3) 300-30 MG per tablet Take 1 tablet by mouth 2 (two) times daily as needed for moderate pain. 60 tablet 0  . Aspirin-Salicylamide-Caffeine (BC FAST PAIN RELIEF) 650-195-33.3 MG PACK Take 1 Package by mouth daily as needed.    Marland Kitchen azithromycin (ZITHROMAX) 250 MG  tablet Take 4 tablets by mouth once. 6 tablet 0  . bismuth subsalicylate (PEPTO BISMOL) 262 MG/15ML suspension Take 30 mLs by mouth every 6 (six) hours as needed.    . diphenhydrAMINE (BENADRYL) 25 MG tablet Take 25 mg by mouth every 6 (six) hours as needed.    . metroNIDAZOLE (FLAGYL) 500 MG tablet Take 1 tablet (500 mg total) by mouth 2 (two) times daily. 14 tablet 0  . traMADol (ULTRAM) 50 MG tablet Take 1 tablet (50 mg total) by mouth every 8 (eight) hours as needed. 90 tablet 0   No facility-administered medications prior to visit.     ROS See HPI  Objective:  BP 129/88 (BP Location: Left Arm, Patient Position: Sitting, Cuff Size: Normal)   Pulse 88   Temp 97.8 F (36.6 C)   Ht 6' (1.829 m)   Wt 187 lb (84.8 kg)   SpO2 97%   BMI 25.36 kg/m   BP Readings from Last 3 Encounters:  11/29/15 129/88  11/19/15 107/66  11/17/15 107/91    Wt Readings from Last 3 Encounters:  11/29/15 187 lb (84.8 kg)  11/19/15 185 lb (83.9 kg)  11/06/15 187 lb (84.8 kg)    Physical Exam  Constitutional: He is oriented to person, place, and time. No distress.  HENT:  Right Ear: External ear normal.  Left Ear: External ear normal.  Nose: Nose normal.  Mouth/Throat: Oropharynx is clear and moist. No oropharyngeal exudate.  Eyes: Conjunctivae and EOM are normal. Pupils are equal, round, and reactive to light. No scleral icterus.  Neck: Normal range of motion. No thyromegaly present.  Cardiovascular: Normal rate and normal heart sounds.  Pulmonary/Chest: Effort normal and breath sounds normal.  Musculoskeletal: Normal range of motion. He exhibits no edema.  Lymphadenopathy:    He has no cervical adenopathy.  Neurological: He is alert and oriented to person, place, and time.  Skin: Skin is warm and dry.  Vitals reviewed.   Lab Results  Component Value Date   WBC 7.4 09/20/2014   HGB 14.3 12/23/2014   HCT 42.0 12/23/2014   PLT 235.0 09/20/2014   GLUCOSE 141 (H) 12/23/2014   CHOL 143  09/20/2014   TRIG 256.0 (H) 09/20/2014   HDL 26.90 (L) 09/20/2014   LDLDIRECT 75.0 09/20/2014   NA 142 12/23/2014   K 3.7 12/23/2014   CL 102 12/23/2014   CREATININE 1.20 12/23/2014   BUN 16 12/23/2014   CO2 29 09/20/2014    Ct Head Wo Contrast  Result Date: 11/19/2015 CLINICAL DATA:  Acute onset of recurrent headache. Initial encounter. EXAM: CT HEAD WITHOUT CONTRAST TECHNIQUE: Contiguous axial images were obtained from the base of the skull through the vertex without intravenous contrast. COMPARISON:  CT of the head performed 04/21/2010 FINDINGS: Brain: No evidence of acute infarction, hemorrhage, hydrocephalus, extra-axial collection or mass lesion/mass effect. The posterior fossa, including the cerebellum, brainstem and fourth ventricle, is within normal limits. The third and lateral ventricles, and basal ganglia are unremarkable in appearance. The cerebral hemispheres are symmetric in appearance, with normal gray-white differentiation. No mass effect or midline shift is seen. Vascular: No hyperdense vessel or unexpected calcification. Skull: There is no evidence of fracture; visualized osseous structures are unremarkable in appearance. Sinuses/Orbits: The visualized portions of the orbits are within normal limits. The paranasal sinuses and mastoid air cells are well-aerated. Other: No significant soft tissue abnormalities are seen. IMPRESSION: Unremarkable noncontrast CT of the head. Electronically Signed   By: Roanna RaiderJeffery  Chang M.D.   On: 11/19/2015 21:16    Assessment & Plan:   Bryan Valdez was seen today for sinus problem.  Diagnoses and all orders for this visit:  Chronic cluster headache, not intractable -     SUMAtriptan (IMITREX) 25 MG tablet; May repeat in 2 hours if headache persists or recurs. Do not more than 100mg  in 24hrs period.   I have discontinued Bryan Valdez's acetaminophen-codeine, traMADol, metroNIDAZOLE, azithromycin, diphenhydrAMINE, Aspirin-Salicylamide-Caffeine, and  bismuth subsalicylate. I am also having him start on SUMAtriptan. Additionally, I am having him maintain his methocarbamol.  Meds ordered this encounter  Medications  . SUMAtriptan (IMITREX) 25 MG tablet    Sig: May repeat in 2 hours if headache persists or recurs. Do not more than 100mg  in 24hrs period.    Dispense:  30 tablet    Refill:  0    Order Specific Question:   Supervising Provider    Answer:   Tresa GarterPLOTNIKOV, ALEKSEI V [[id number]   Follow-up: Return if symptoms worsen or fail to improve.  Alysia Pennaharlotte Jennifier Smitherman, NP

## 2015-12-07 ENCOUNTER — Other Ambulatory Visit (INDEPENDENT_AMBULATORY_CARE_PROVIDER_SITE_OTHER): Payer: PRIVATE HEALTH INSURANCE

## 2015-12-07 ENCOUNTER — Encounter: Payer: Self-pay | Admitting: Neurology

## 2015-12-07 ENCOUNTER — Ambulatory Visit (INDEPENDENT_AMBULATORY_CARE_PROVIDER_SITE_OTHER): Payer: PRIVATE HEALTH INSURANCE | Admitting: Neurology

## 2015-12-07 VITALS — BP 124/60 | HR 71 | Ht 72.0 in | Wt 186.0 lb

## 2015-12-07 DIAGNOSIS — R51 Headache: Secondary | ICD-10-CM

## 2015-12-07 DIAGNOSIS — Z79899 Other long term (current) drug therapy: Secondary | ICD-10-CM

## 2015-12-07 DIAGNOSIS — R519 Headache, unspecified: Secondary | ICD-10-CM

## 2015-12-07 LAB — BASIC METABOLIC PANEL
BUN: 13 mg/dL (ref 6–23)
CALCIUM: 9.2 mg/dL (ref 8.4–10.5)
CO2: 31 meq/L (ref 19–32)
CREATININE: 1.16 mg/dL (ref 0.40–1.50)
Chloride: 105 mEq/L (ref 96–112)
GFR: 85.88 mL/min (ref >60.00)
GLUCOSE: 107 mg/dL — AB (ref 70–99)
Potassium: 3.6 mEq/L (ref 3.5–5.1)
Sodium: 142 mEq/L (ref 135–145)

## 2015-12-07 MED ORDER — DIPHENHYDRAMINE HCL 50 MG/ML IJ SOLN
25.0000 mg | Freq: Once | INTRAMUSCULAR | Status: AC
  Administered 2015-12-07: 25 mg via INTRAVENOUS

## 2015-12-07 MED ORDER — KETOROLAC TROMETHAMINE 30 MG/ML IJ SOLN
30.0000 mg | Freq: Once | INTRAMUSCULAR | Status: AC
  Administered 2015-12-07: 30 mg via INTRAMUSCULAR

## 2015-12-07 MED ORDER — SUMATRIPTAN SUCCINATE 100 MG PO TABS: ORAL_TABLET | ORAL | 2 refills | Status: DC

## 2015-12-07 MED ORDER — METOCLOPRAMIDE HCL 5 MG/ML IJ SOLN
10.0000 mg | Freq: Once | INTRAVENOUS | Status: AC
  Administered 2015-12-07: 10 mg via INTRAMUSCULAR

## 2015-12-07 MED ORDER — PREDNISONE 10 MG PO TABS: ORAL_TABLET | ORAL | 0 refills | Status: DC

## 2015-12-07 MED ORDER — TOPIRAMATE 50 MG PO TABS: ORAL_TABLET | ORAL | 0 refills | Status: DC

## 2015-12-07 NOTE — Patient Instructions (Signed)
  1.  Start topiramate 50mg  tablets.  Take 1 tablet at bedtime for 7 days,  Then 1 tablet in morning and one tablet at bedtime .  Call in 5 weeks with update and we can adjust dose if needed. 2.  Take sumatriptan 100mg  at earliest onset of headache.  May repeat dose once in 2 hours if needed.  Do not exceed two tablets in 24 hours. 3.  Limit use of pain relievers to no more than 2 days out of the week.  These medications include acetaminophen, ibuprofen, triptans and narcotics.  This will help reduce risk of rebound headaches. 4.  To help break this headache, I will prescribe you a prednisone 10mg  tablet taper:  Take 6tabs daily x3 sday, then 5 tablets daily x3 days, then 4tablets daily x3day, then 3tablets daily x 3 days, then 2tablets daily x3day, then 1tablet daily x3days, then STOP  5.  Avoid caffeine 6.  Stay adequately hydrated with water 7.  Follow up in 2 months BUT CONTACT ME IN 5 WEEKS WITH UPDATE.

## 2015-12-07 NOTE — Addendum Note (Signed)
Addended by: Sheilah MinsFOX, JADA A on: 12/07/2015 08:43 AM   Modules accepted: Orders
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
## 2015-12-21 ENCOUNTER — Other Ambulatory Visit: Payer: Self-pay | Admitting: Neurology

## 2015-12-28 ENCOUNTER — Other Ambulatory Visit: Payer: Self-pay | Admitting: Neurology

## 2016-01-02 MED ORDER — TOPIRAMATE 50 MG PO TABS: 50.0000 mg | ORAL_TABLET | Freq: Two times a day (BID) | ORAL | 3 refills | Status: DC

## 2016-01-02 NOTE — Addendum Note (Signed)
Addended by: Sheilah MinsFOX, JADA A on: 01/02/2016 03:13 PM   Modules accepted: Orders

## 2016-01-03 ENCOUNTER — Other Ambulatory Visit: Payer: Self-pay | Admitting: Neurology

## 2016-01-03 ENCOUNTER — Other Ambulatory Visit: Payer: Self-pay

## 2016-01-03 MED ORDER — TOPIRAMATE 50 MG PO TABS: 50.0000 mg | ORAL_TABLET | Freq: Two times a day (BID) | ORAL | 1 refills | Status: DC

## 2016-01-25 ENCOUNTER — Ambulatory Visit: Payer: PRIVATE HEALTH INSURANCE | Admitting: Neurology

## 2016-02-06 ENCOUNTER — Ambulatory Visit: Payer: PRIVATE HEALTH INSURANCE | Admitting: Neurology

## 2016-02-08 ENCOUNTER — Encounter: Payer: Self-pay | Admitting: Neurology

## 2016-03-29 ENCOUNTER — Ambulatory Visit: Payer: No Typology Code available for payment source | Admitting: Neurology

## 2016-04-17 ENCOUNTER — Ambulatory Visit: Payer: No Typology Code available for payment source | Admitting: Neurology

## 2016-04-17 DIAGNOSIS — Z029 Encounter for administrative examinations, unspecified: Secondary | ICD-10-CM

## 2016-04-23 ENCOUNTER — Encounter: Payer: Self-pay | Admitting: Family

## 2016-04-23 ENCOUNTER — Ambulatory Visit (INDEPENDENT_AMBULATORY_CARE_PROVIDER_SITE_OTHER): Payer: PRIVATE HEALTH INSURANCE | Admitting: Family

## 2016-04-23 ENCOUNTER — Other Ambulatory Visit: Payer: PRIVATE HEALTH INSURANCE

## 2016-04-23 VITALS — BP 120/80 | HR 75 | Temp 98.0°F | Resp 16 | Ht 72.0 in | Wt 189.0 lb

## 2016-04-23 DIAGNOSIS — G8929 Other chronic pain: Secondary | ICD-10-CM | POA: Diagnosis not present

## 2016-04-23 DIAGNOSIS — Z412 Encounter for routine and ritual male circumcision: Secondary | ICD-10-CM | POA: Diagnosis not present

## 2016-04-23 DIAGNOSIS — R300 Dysuria: Secondary | ICD-10-CM

## 2016-04-23 DIAGNOSIS — M79672 Pain in left foot: Secondary | ICD-10-CM

## 2016-04-23 DIAGNOSIS — M5442 Lumbago with sciatica, left side: Secondary | ICD-10-CM

## 2016-04-23 HISTORY — DX: Encounter for routine and ritual male circumcision: Z41.2

## 2016-04-23 HISTORY — DX: Pain in left foot: M79.672

## 2016-04-23 LAB — POCT URINALYSIS DIPSTICK
Bilirubin, UA: NEGATIVE
Blood, UA: NEGATIVE
GLUCOSE UA: NEGATIVE
KETONES UA: NEGATIVE
Nitrite, UA: NEGATIVE
PROTEIN UA: NEGATIVE
Spec Grav, UA: >=1.03
Urobilinogen, UA: NEGATIVE
pH, UA: 6

## 2016-04-23 MED ORDER — AZITHROMYCIN 250 MG PO TABS: ORAL_TABLET | ORAL | 0 refills | Status: DC

## 2016-04-23 MED ORDER — CEFTRIAXONE SODIUM 500 MG IJ SOLR
500.0000 mg | Freq: Once | INTRAMUSCULAR | Status: AC
  Administered 2016-04-23: 500 mg via INTRAMUSCULAR

## 2016-04-23 MED ORDER — TIZANIDINE HCL 4 MG PO TABS: 4.0000 mg | ORAL_TABLET | Freq: Four times a day (QID) | ORAL | 0 refills | Status: DC | PRN

## 2016-04-23 NOTE — Assessment & Plan Note (Signed)
In office urinalysis positive for leukocytes and hematuria and negative for nitrates. In office injection of Rocephin provided. Obtain urine culture and gonorrhea/chlamydia urine probe. Treat empirically with azithromycin. Follow-up pending urine culture results for further antibiotics as indicated.

## 2016-04-23 NOTE — Assessment & Plan Note (Signed)
Continues to experience low back pain with concern for disc-related pathology previously responsive to cortisone injections. Refer to orthopedics to reestablish and for cortisone injections. Start Zanaflex as needed for muscle spasms. Continue with ice/moist heat and over-the-counter medications as needed for symptom relief and supportive care.

## 2016-04-23 NOTE — Patient Instructions (Signed)
Thank you for choosing [facility].  SUMMARY AND INSTRUCTIONS:  Ice/moist heat x 20 minutes every 2 hours as needed.   Stretches and exercises as needed.  They will call to schedule your appointment with orthopedics and urology.   Medication:  Take Azithromycin as prescribed.   Your prescription(s) have been submitted to your pharmacy or been printed and provided for you. Please take as directed and contact our office if you believe you are having problem(s) with the medication(s) or have any questions.  Follow up:  If your symptoms worsen or fail to improve, please contact our office for further instruction, or in case of emergency go directly to the emergency room at the closest medical facility.

## 2016-04-23 NOTE — Assessment & Plan Note (Signed)
Patient wishes to explore the potential for circumcision. Referral to urology place for further consultation.
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
## 2016-04-24 LAB — URINE CULTURE

## 2016-04-24 LAB — GC/CHLAMYDIA PROBE AMP
CT Probe RNA: NOT DETECTED
GC Probe RNA: NOT DETECTED

## 2016-04-26 ENCOUNTER — Telehealth: Payer: Self-pay | Admitting: Family

## 2016-04-26 NOTE — Telephone Encounter (Signed)
Please advise 

## 2016-04-26 NOTE — Telephone Encounter (Signed)
Pt called in and would like to know his lab results. 

## 2016-04-29 NOTE — Telephone Encounter (Signed)
Please inform patient that his lab work is without infection and the medication should have helped to clear his symptoms.

## 2016-04-29 NOTE — Telephone Encounter (Signed)
Pt called in read him response from [place], He has questions at to what all he was tested for

## 2016-04-29 NOTE — Telephone Encounter (Signed)
Spoke with pt about labs that were done. Wants to come back in and get tested for trich. Set him up an appointment for tomorrow.

## 2016-04-30 ENCOUNTER — Ambulatory Visit: Payer: PRIVATE HEALTH INSURANCE | Admitting: Family

## 2016-05-08 ENCOUNTER — Ambulatory Visit: Payer: No Typology Code available for payment source | Admitting: Neurology

## 2016-05-08 ENCOUNTER — Encounter: Payer: Self-pay | Admitting: Neurology

## 2016-05-10 ENCOUNTER — Telehealth: Payer: Self-pay | Admitting: Neurology

## 2016-05-10 NOTE — Telephone Encounter (Signed)
Patient dismissed from [facility] by Shon MilletAdam Jaffe DO , effective May 08, 2016. Dismissal letter sent out by certified / registered mail. DAJ

## 2016-05-28 ENCOUNTER — Ambulatory Visit (INDEPENDENT_AMBULATORY_CARE_PROVIDER_SITE_OTHER): Payer: Self-pay | Admitting: Orthopaedic Surgery

## 2016-06-11 NOTE — Telephone Encounter (Signed)
Certified dismissal letter returned as undeliverable, unclaimed, return to sender after three attempts by [employer] on June 11, 2016. Letter placed in another envelope and resent as 1st class mail which does not require a signature. °DAJ °

## 2016-07-02 ENCOUNTER — Ambulatory Visit (INDEPENDENT_AMBULATORY_CARE_PROVIDER_SITE_OTHER): Payer: Self-pay | Admitting: Orthopaedic Surgery

## 2016-07-23 ENCOUNTER — Telehealth: Payer: Self-pay | Admitting: Family

## 2016-07-23 MED ORDER — METRONIDAZOLE 500 MG PO TABS: 500.0000 mg | ORAL_TABLET | Freq: Two times a day (BID) | ORAL | 0 refills | Status: DC

## 2016-07-23 NOTE — Telephone Encounter (Signed)
Pt girlfriend was diagnosed with Trichomoniasis, he would a Rx sent in got this.  [facility] [address]

## 2016-07-23 NOTE — Telephone Encounter (Signed)
Medication sent to pharmacy  

## 2016-07-29 ENCOUNTER — Encounter (HOSPITAL_COMMUNITY): Payer: Self-pay

## 2016-07-29 ENCOUNTER — Emergency Department (HOSPITAL_COMMUNITY): Payer: No Typology Code available for payment source

## 2016-07-29 ENCOUNTER — Emergency Department (HOSPITAL_COMMUNITY)
Admission: EM | Admit: 2016-07-29 | Discharge: 2016-07-29 | Disposition: A | Discharge status: Home / Self Care | Payer: No Typology Code available for payment source | Attending: Emergency Medicine | Admitting: Emergency Medicine

## 2016-07-29 DIAGNOSIS — S199XXA Unspecified injury of neck, initial encounter: Secondary | ICD-10-CM | POA: Diagnosis present

## 2016-07-29 DIAGNOSIS — Y939 Activity, unspecified: Secondary | ICD-10-CM | POA: Diagnosis not present

## 2016-07-29 DIAGNOSIS — Y92410 Unspecified street and highway as the place of occurrence of the external cause: Secondary | ICD-10-CM | POA: Diagnosis not present

## 2016-07-29 DIAGNOSIS — M542 Cervicalgia: Secondary | ICD-10-CM | POA: Diagnosis not present

## 2016-07-29 DIAGNOSIS — Z87891 Personal history of nicotine dependence: Secondary | ICD-10-CM | POA: Diagnosis not present

## 2016-07-29 DIAGNOSIS — Z79899 Other long term (current) drug therapy: Secondary | ICD-10-CM | POA: Insufficient documentation

## 2016-07-29 DIAGNOSIS — Y999 Unspecified external cause status: Secondary | ICD-10-CM | POA: Insufficient documentation

## 2016-07-29 MED ORDER — OXYCODONE-ACETAMINOPHEN 5-325 MG PO TABS
1.0000 | ORAL_TABLET | Freq: Once | ORAL | Status: AC
  Administered 2016-07-29: 1 via ORAL
  Filled 2016-07-29: qty 1
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
## 2016-07-29 NOTE — ED Notes (Signed)
Patient transported to X-ray 
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
## 2016-09-26 ENCOUNTER — Telehealth: Payer: Self-pay | Admitting: Family

## 2016-09-26 DIAGNOSIS — R300 Dysuria: Secondary | ICD-10-CM

## 2016-09-26 NOTE — Telephone Encounter (Signed)
Pt called wanting to know if Tammy SoursGreg could send a prescription in for him. He said that when he urinates it tingles a little bit. He said that he did not know if it could be from drinking too many sodas? Please advise.

## 2016-09-29 NOTE — Telephone Encounter (Signed)
Urine lab work entered for him to complete at his convenience.

## 2016-09-30 NOTE — Addendum Note (Signed)
Addended by: Jeanine LuzALONE, Elyas Villamor D on: 09/30/2016 03:58 PM   Modules accepted: Orders

## 2016-09-30 NOTE — Telephone Encounter (Signed)
Labs are updated

## 2016-09-30 NOTE — Telephone Encounter (Signed)
Pt states he went to urology to get things checked out and they said everything was fine. He wants the whole STD panel put in so he can get checked. Please advise

## 2016-10-22 ENCOUNTER — Other Ambulatory Visit: Payer: PRIVATE HEALTH INSURANCE

## 2016-10-22 DIAGNOSIS — R300 Dysuria: Secondary | ICD-10-CM

## 2016-10-23 LAB — RPR

## 2016-10-23 LAB — HIV ANTIBODY (ROUTINE TESTING W REFLEX): HIV 1&2 Ab, 4th Generation: NONREACTIVE

## 2016-10-24 ENCOUNTER — Telehealth: Payer: Self-pay | Admitting: Family

## 2016-10-24 NOTE — Telephone Encounter (Signed)
Patient called in requesting results of labs.  I advised patient on Greg's response.  Patient would like call back.  Would like to know what he was tested for.  States that when he has intercourse with his girlfriend she always gets a yeast infection.  Patient is afraid he is transmitting something to her.

## 2017-03-02 ENCOUNTER — Other Ambulatory Visit: Payer: Self-pay

## 2017-03-02 ENCOUNTER — Emergency Department (HOSPITAL_COMMUNITY)
Admission: EM | Admit: 2017-03-02 | Discharge: 2017-03-02 | Disposition: A | Discharge status: Home / Self Care | Payer: PRIVATE HEALTH INSURANCE | Attending: Emergency Medicine | Admitting: Emergency Medicine

## 2017-03-02 ENCOUNTER — Encounter (HOSPITAL_COMMUNITY): Payer: Self-pay | Admitting: Emergency Medicine

## 2017-03-02 DIAGNOSIS — Z79899 Other long term (current) drug therapy: Secondary | ICD-10-CM | POA: Insufficient documentation

## 2017-03-02 DIAGNOSIS — Z87891 Personal history of nicotine dependence: Secondary | ICD-10-CM | POA: Insufficient documentation

## 2017-03-02 DIAGNOSIS — G43009 Migraine without aura, not intractable, without status migrainosus: Secondary | ICD-10-CM

## 2017-03-02 MED ORDER — DEXAMETHASONE SODIUM PHOSPHATE 10 MG/ML IJ SOLN
10.0000 mg | Freq: Once | INTRAMUSCULAR | Status: AC
  Administered 2017-03-02: 10 mg via INTRAMUSCULAR
  Filled 2017-03-02: qty 1

## 2017-03-02 MED ORDER — DIPHENHYDRAMINE HCL 50 MG/ML IJ SOLN
25.0000 mg | Freq: Once | INTRAMUSCULAR | Status: AC
  Administered 2017-03-02: 25 mg via INTRAMUSCULAR
  Filled 2017-03-02: qty 1

## 2017-03-02 MED ORDER — PREDNISONE 20 MG PO TABS: ORAL_TABLET | ORAL | 0 refills | Status: DC

## 2017-03-02 MED ORDER — METOCLOPRAMIDE HCL 5 MG/ML IJ SOLN
10.0000 mg | Freq: Once | INTRAMUSCULAR | Status: AC
  Administered 2017-03-02: 10 mg via INTRAMUSCULAR
  Filled 2017-03-02: qty 2
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
## 2017-03-02 NOTE — ED Triage Notes (Signed)
Pt reports he has had a Migrain since Tuesday.  Pt reported he wants a migraine cocktail .

## 2017-03-02 NOTE — ED Notes (Signed)
ED Provider at bedside. 

## 2017-03-02 NOTE — ED Triage Notes (Signed)
Pt c/o migraine with light sensitivity. Hx of the same.

## 2017-03-09 ENCOUNTER — Encounter (HOSPITAL_COMMUNITY): Payer: Self-pay

## 2017-03-09 ENCOUNTER — Emergency Department (HOSPITAL_COMMUNITY)
Admission: EM | Admit: 2017-03-09 | Discharge: 2017-03-09 | Disposition: A | Discharge status: Home / Self Care | Payer: Self-pay | Attending: Emergency Medicine | Admitting: Emergency Medicine

## 2017-03-09 ENCOUNTER — Emergency Department (HOSPITAL_COMMUNITY): Payer: Self-pay

## 2017-03-09 DIAGNOSIS — Z87891 Personal history of nicotine dependence: Secondary | ICD-10-CM | POA: Insufficient documentation

## 2017-03-09 DIAGNOSIS — Z79899 Other long term (current) drug therapy: Secondary | ICD-10-CM | POA: Insufficient documentation

## 2017-03-09 DIAGNOSIS — G43909 Migraine, unspecified, not intractable, without status migrainosus: Secondary | ICD-10-CM | POA: Insufficient documentation

## 2017-03-09 DIAGNOSIS — R112 Nausea with vomiting, unspecified: Secondary | ICD-10-CM | POA: Insufficient documentation

## 2017-03-09 MED ORDER — METOCLOPRAMIDE HCL 5 MG/ML IJ SOLN
10.0000 mg | Freq: Once | INTRAMUSCULAR | Status: AC
Start: 2017-03-09 — End: 2017-03-09
  Administered 2017-03-09: 10 mg via INTRAVENOUS
  Filled 2017-03-09: qty 2

## 2017-03-09 MED ORDER — MORPHINE SULFATE (PF) 4 MG/ML IV SOLN
4.0000 mg | Freq: Once | INTRAVENOUS | Status: AC
  Administered 2017-03-09: 4 mg via INTRAVENOUS
  Filled 2017-03-09: qty 1

## 2017-03-09 MED ORDER — SODIUM CHLORIDE 0.9 % IV BOLUS (SEPSIS)
1000.0000 mL | Freq: Once | INTRAVENOUS | Status: AC
  Administered 2017-03-09: 1000 mL via INTRAVENOUS

## 2017-03-09 MED ORDER — KETOROLAC TROMETHAMINE 30 MG/ML IJ SOLN
30.0000 mg | Freq: Once | INTRAMUSCULAR | Status: AC
  Administered 2017-03-09: 30 mg via INTRAVENOUS
  Filled 2017-03-09: qty 1

## 2017-03-09 NOTE — ED Notes (Signed)
Pt returned from CT °

## 2017-03-09 NOTE — ED Notes (Signed)
Patient transported to CT 

## 2017-03-09 NOTE — ED Notes (Signed)
ED Provider at bedside. 

## 2017-03-09 NOTE — Discharge Instructions (Signed)
Please follow up with your neurologist within 1 week. Please follow up with an optometrist to get your vision checked within 1 week as well. Return to the ER for any persistent headaches, vomiting, vision loss, or any new or worsening symptoms.
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
## 2017-03-09 NOTE — ED Triage Notes (Signed)
Pt from home for HA x 1 week accompanied by N/V. Pt reports hx of migraines and states he was seen here last week for same but states the treatment did not work. Pt reports he's tried ice packs and OTC medication with no relief. Pt ambulatory to room with steady gait. A&Ox4.

## 2017-07-09 ENCOUNTER — Ambulatory Visit: Payer: Self-pay | Admitting: Physician Assistant

## 2017-07-10 ENCOUNTER — Encounter: Payer: Self-pay | Admitting: Family Medicine

## 2017-07-10 ENCOUNTER — Other Ambulatory Visit: Payer: Self-pay

## 2017-07-10 ENCOUNTER — Ambulatory Visit: Payer: Self-pay | Admitting: Family Medicine

## 2017-07-10 VITALS — BP 110/68 | HR 98 | Temp 98.9°F | Ht 72.05 in | Wt 187.0 lb

## 2017-07-10 DIAGNOSIS — Z202 Contact with and (suspected) exposure to infections with a predominantly sexual mode of transmission: Secondary | ICD-10-CM

## 2017-07-10 MED ORDER — METRONIDAZOLE 500 MG PO TABS
500.0000 mg | ORAL_TABLET | Freq: Two times a day (BID) | ORAL | 0 refills | Status: DC
Start: 2017-07-10 — End: 2022-04-30

## 2017-07-10 NOTE — Patient Instructions (Signed)
     IF you received an x-ray today, you will receive an invoice from [facility]. Please contact [facility] at [phone] with questions or concerns regarding your invoice.   IF you received labwork today, you will receive an invoice from [facility]. Please contact [facility] at [phone] with questions or concerns regarding your invoice.   Our billing staff will not be able to assist you with questions regarding bills from these companies.  You will be contacted with the lab results as soon as they are available. The fastest way to get your results is to activate your My Chart account. Instructions are located on the last page of this paperwork. If you have not heard from us regarding the results in 2 weeks, please contact this office.     

## 2017-07-11 LAB — HIV ANTIBODY (ROUTINE TESTING W REFLEX): HIV Screen 4th Generation wRfx: NONREACTIVE

## 2017-07-11 LAB — RPR: RPR Ser Ql: NONREACTIVE

## 2017-07-11 LAB — HCV AB W REFLEX TO QUANT PCR: HCV Ab: <0.1 s/co ratio (ref 0.0–0.9)

## 2017-07-11 LAB — HCV INTERPRETATION

## 2017-07-13 LAB — GC/CHLAMYDIA PROBE AMP
Chlamydia trachomatis, NAA: NEGATIVE
Neisseria gonorrhoeae by PCR: NEGATIVE

## 2017-07-13 LAB — TRICHOMONAS VAGINALIS, PROBE AMP: Trich vag by NAA: NEGATIVE

## 2017-07-31 ENCOUNTER — Encounter: Payer: Self-pay | Admitting: Family Medicine
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
## 2017-08-15 ENCOUNTER — Ambulatory Visit: Payer: Self-pay | Admitting: Family Medicine

## 2017-10-10 ENCOUNTER — Encounter: Payer: Self-pay | Admitting: Family Medicine

## 2017-10-15 ENCOUNTER — Encounter: Payer: Self-pay | Admitting: Family Medicine

## 2017-10-17 ENCOUNTER — Encounter: Payer: Self-pay | Admitting: Nurse Practitioner

## 2017-10-28 ENCOUNTER — Encounter: Payer: Self-pay | Admitting: Family Medicine

## 2018-09-18 IMAGING — CT CT HEAD W/O CM
4 series · 15 of 47 positions shown, 17 images · non-contrast
Comparison: 11/19/2015

CLINICAL DATA: Headache

EXAM:
CT HEAD WITHOUT CONTRAST
TECHNIQUE: Contiguous axial images were obtained from the base of the skull
through the vertex without intravenous contrast.

[Series 3: head without · axial · non-contrast · 0.43mm/px · z∈[+1332,+1427]mm · 7 of 27 slices shown, 9 images]
[im 4/27  brain]
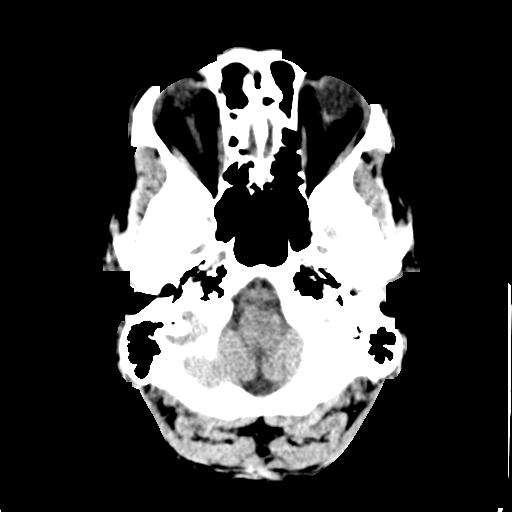
[im 4/27  bone]
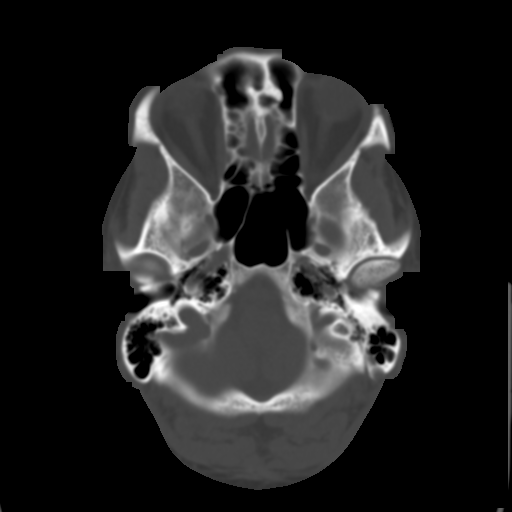
[im 7/27  brain]
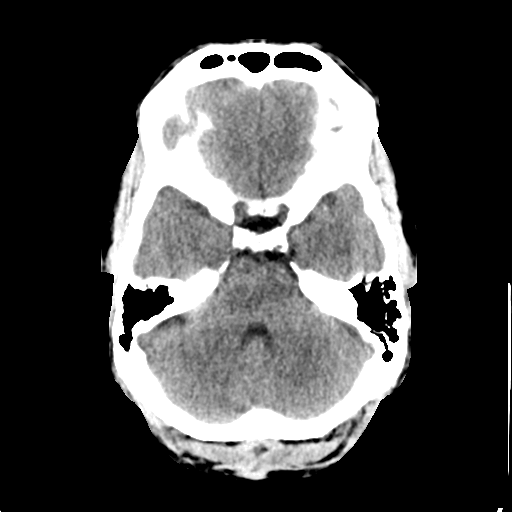
[im 10/27  brain]
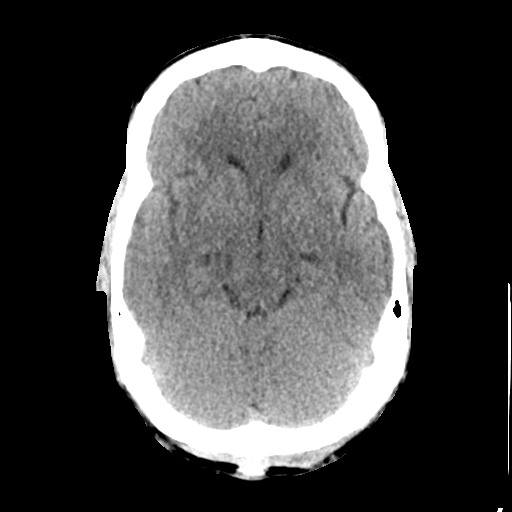
[im 14/27  brain]
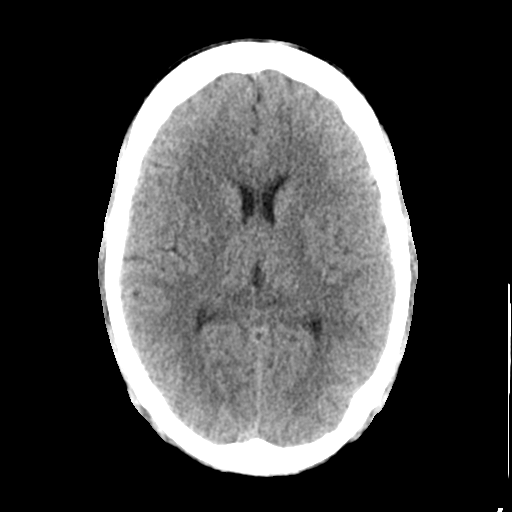
[im 17/27  brain]
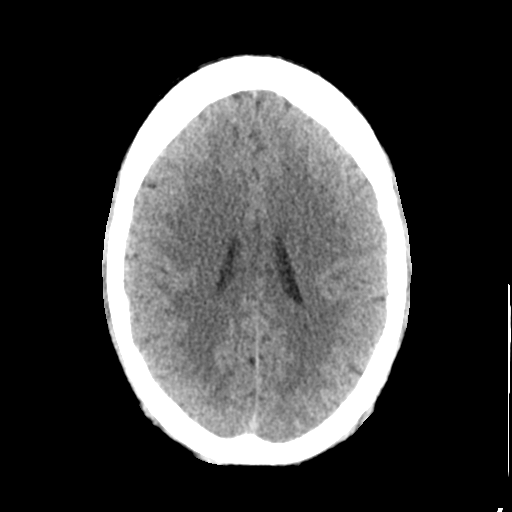
[im 17/27  bone]
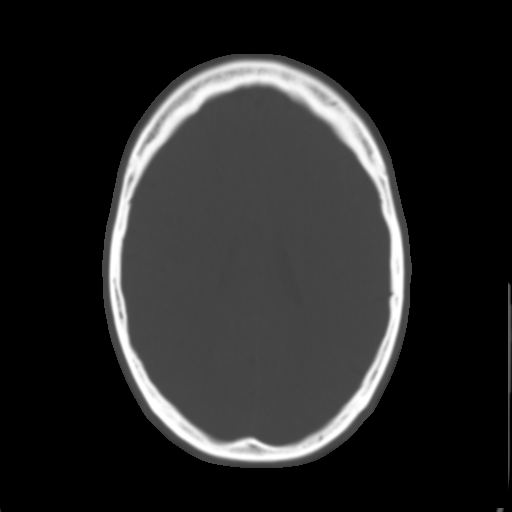
[im 20/27  brain]
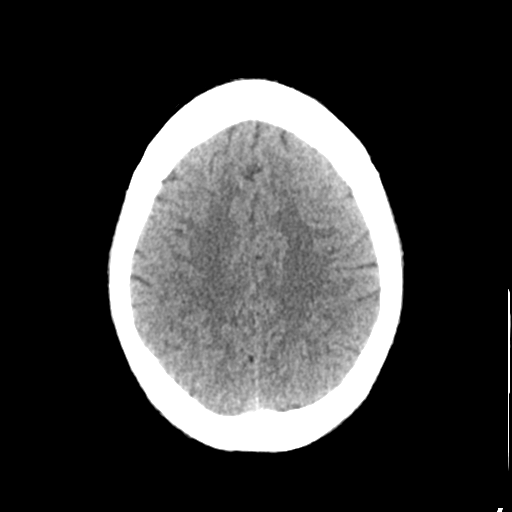
[im 23/27  brain]
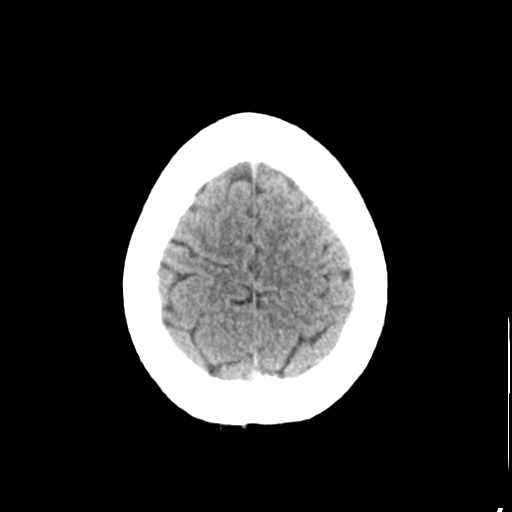

[Series 4: head bone · axial · 0.43mm/px · z∈[+1329,+1343]mm · 2 of 66 slices shown]
[im 7/66  bone]
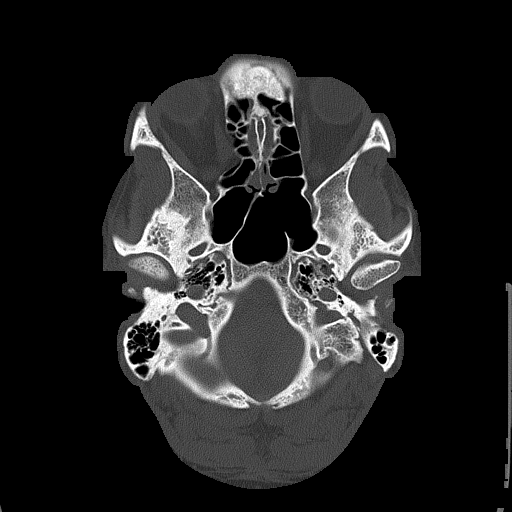
[im 14/66  bone]
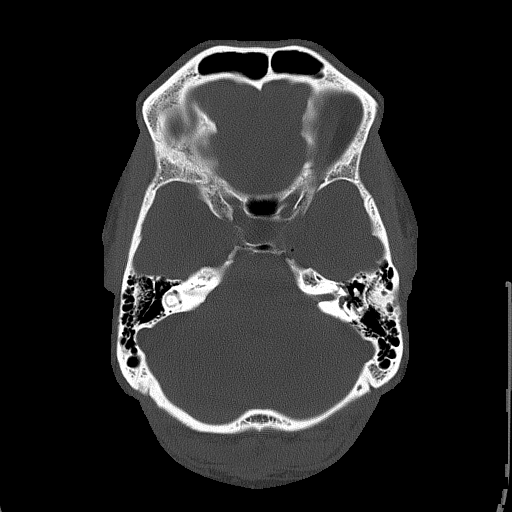

[Series 5: head without cor · coronal · non-contrast · 0.31mm/px · 3 of 67 slices shown]
[im 23/67  brain]
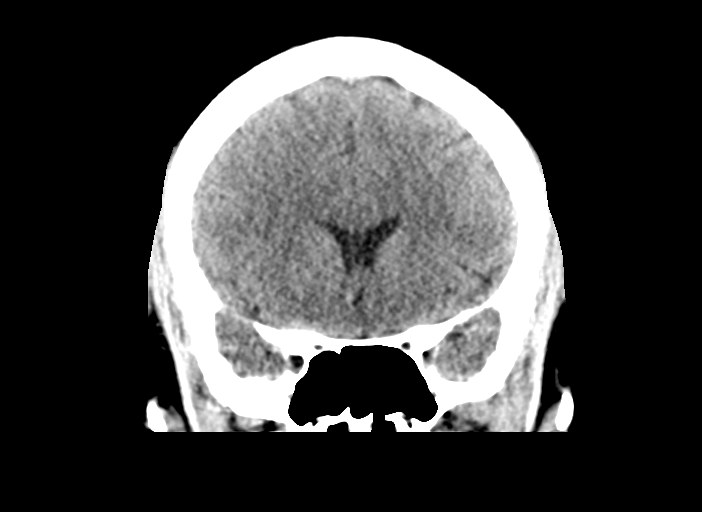
[im 30/67  brain]
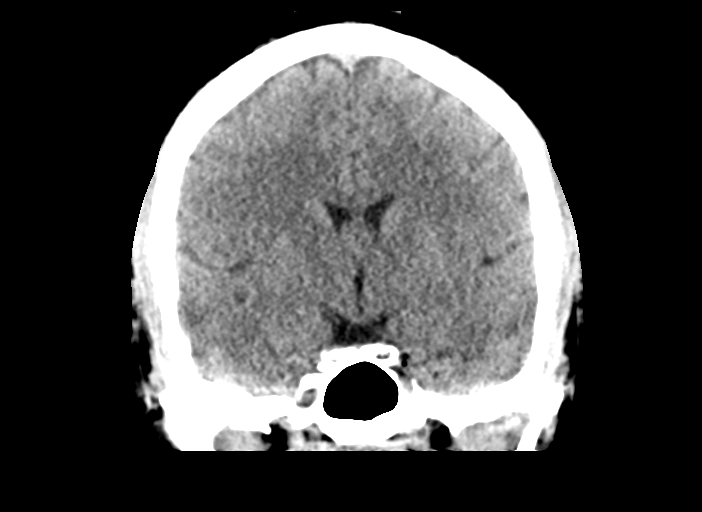
[im 37/67  brain]
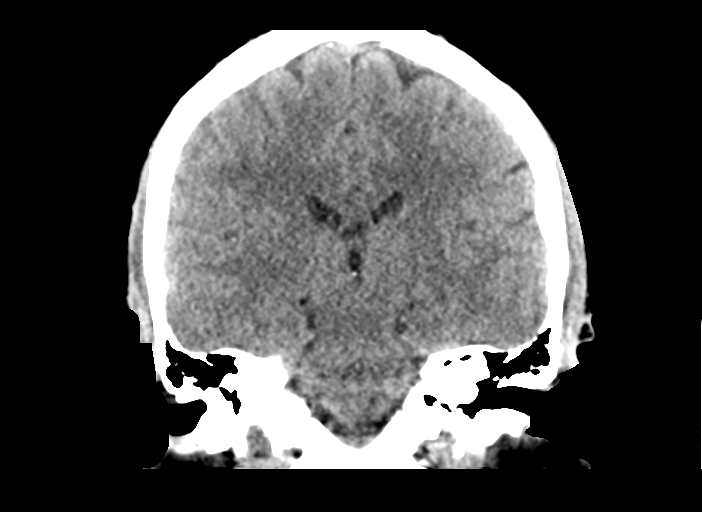

[Series 6: head without sag · sagittal · non-contrast · 0.29mm/px · 3 of 67 slices shown]
[im 12/67  brain]
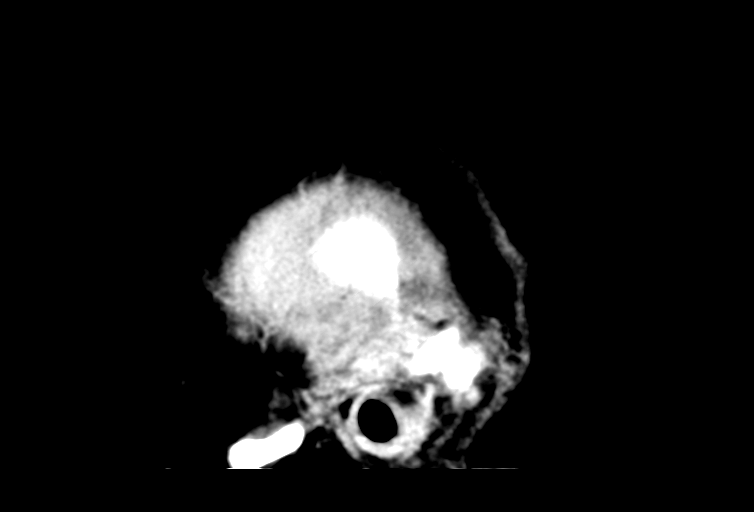
[im 23/67  brain]
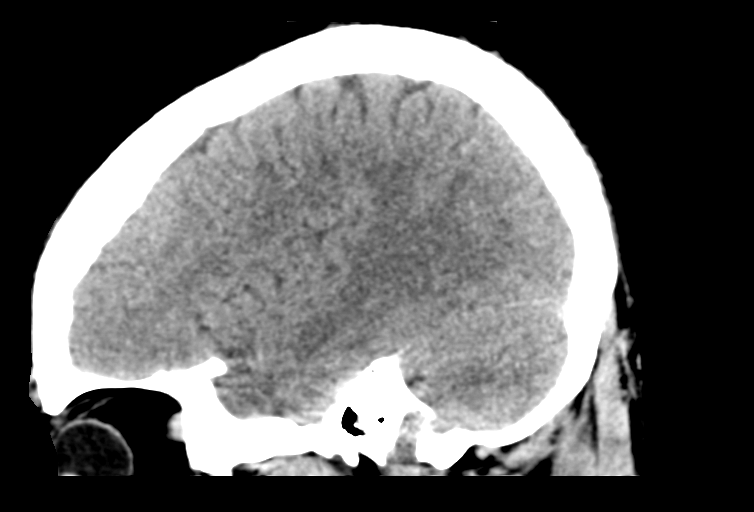
[im 34/67  brain]
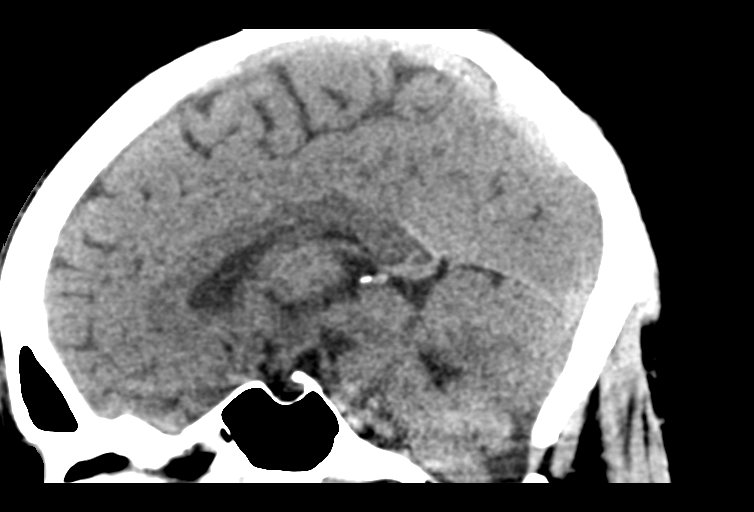

[15 of 47 positions shown; findings below may reference images not displayed]

FINDINGS: Brain: No acute intracranial abnormality. Specifically, no
hemorrhage, hydrocephalus, mass lesion, acute infarction, or
significant intracranial injury.

Vascular: No hyperdense vessel or unexpected calcification.

Skull: No acute calvarial abnormality.

Sinuses/Orbits: Visualized paranasal sinuses and mastoids clear.
Orbital soft tissues unremarkable. Old medial right orbital wall
blowout fracture.

Other: None
IMPRESSION: No intracranial abnormality.

## 2021-12-20 ENCOUNTER — Ambulatory Visit: Payer: Self-pay | Admitting: Orthopedic Surgery

## 2021-12-24 ENCOUNTER — Encounter: Payer: Self-pay | Admitting: Orthopedic Surgery

## 2021-12-24 ENCOUNTER — Ambulatory Visit (INDEPENDENT_AMBULATORY_CARE_PROVIDER_SITE_OTHER): Payer: PRIVATE HEALTH INSURANCE | Admitting: Orthopedic Surgery

## 2021-12-24 ENCOUNTER — Ambulatory Visit (INDEPENDENT_AMBULATORY_CARE_PROVIDER_SITE_OTHER): Payer: PRIVATE HEALTH INSURANCE

## 2021-12-24 VITALS — BP 122/87 | HR 70 | Ht 72.0 in | Wt 196.0 lb

## 2021-12-24 DIAGNOSIS — M5416 Radiculopathy, lumbar region: Secondary | ICD-10-CM | POA: Diagnosis not present

## 2021-12-24 DIAGNOSIS — M5442 Lumbago with sciatica, left side: Secondary | ICD-10-CM
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
## 2022-01-06 ENCOUNTER — Other Ambulatory Visit: Payer: PRIVATE HEALTH INSURANCE

## 2022-02-27 ENCOUNTER — Telehealth: Payer: Self-pay | Admitting: Orthopedic Surgery

## 2022-02-27 NOTE — Telephone Encounter (Signed)
Patient called in stating he would like to go ahead and get his MRI again now that he is back in town he had to cancel being he was out of town at the time and he would also like to follow up on getting a back injection also please advise, patient has [employer] (which is [place]) and is willing to accept full billing as self pay but is looking for new insurance that is in network

## 2022-03-01 NOTE — Telephone Encounter (Signed)
I called and spoke with patient- He is going to bring his insurance card to the office on Monday morning to have the front desk to scan it into his chart.

## 2022-03-07 NOTE — Telephone Encounter (Signed)
I called, he is waiting his card to come so he can bring it over

## 2022-03-25 ENCOUNTER — Ambulatory Visit
Admission: RE | Admit: 2022-03-25 | Discharge: 2022-03-25 | Disposition: A | Discharge status: Home / Self Care | Payer: Medicaid Other | Source: Ambulatory Visit | Attending: Orthopedic Surgery | Admitting: Orthopedic Surgery

## 2022-03-25 ENCOUNTER — Other Ambulatory Visit: Payer: Self-pay | Admitting: Orthopedic Surgery

## 2022-03-25 DIAGNOSIS — M5416 Radiculopathy, lumbar region: Secondary | ICD-10-CM

## 2022-03-28 ENCOUNTER — Telehealth: Payer: Self-pay | Admitting: Orthopedic Surgery

## 2022-03-28 NOTE — Telephone Encounter (Signed)
Pt called requesting to go over his MRI review when it comes back to Dr. Laurance Flatten. Dr Frankey Poot have any open appts til the end of Feb and pt states he can't wait that long. Please call pt at [phone].

## 2022-04-02 ENCOUNTER — Telehealth: Payer: Self-pay | Admitting: Orthopedic Surgery

## 2022-04-02 NOTE — Telephone Encounter (Signed)
Patient has an appointment for 2/21 at 3pm to go over his MRI. Patient wants to know if Dr. Laurance Flatten  will just call him with the information

## 2022-04-11 ENCOUNTER — Telehealth: Payer: Self-pay | Admitting: Orthopedic Surgery

## 2022-04-11 DIAGNOSIS — M47816 Spondylosis without myelopathy or radiculopathy, lumbar region: Secondary | ICD-10-CM

## 2022-04-11 DIAGNOSIS — M5416 Radiculopathy, lumbar region: Secondary | ICD-10-CM

## 2022-04-11 NOTE — Telephone Encounter (Signed)
Patient states he missed a call

## 2022-04-11 NOTE — Telephone Encounter (Signed)
See other message sent to Dr. Laurance Flatten

## 2022-04-11 NOTE — Addendum Note (Signed)
Addended by: Ileene Rubens on: 04/11/2022 01:04 PM   Modules accepted: Orders

## 2022-04-11 NOTE — Telephone Encounter (Signed)
Orthopedic telephone note  Was able to get in touch with the patient this afternoon.  Discussed his MRI and its results.  He was interested in trying an injection so a referral was provided to Dr. Ernestina Patches to perform a right L4/5 and L5/S1 transforaminal injection.  He told me that he was having a lot of knee pain 2 and wanted to discuss that with me.  I told him that he should keep his appointment on 04/17/2022 and will get knee x-rays at that visit.  We may be able to do knee steroid injections at that visit depending on my assessment.  Patient expressed understanding of the plan and will see me on the 21st.  Callie Fielding, MD Orthopedic Surgeon

## 2022-04-11 NOTE — Telephone Encounter (Signed)
Orthopedic telephone call note  Tried calling Jexiel this afternoon as he wanted discussed his MRI results over the phone.  He did not pick up and his voicemail box is full.  He will have to either call me back or come to the office to discuss the results.  Callie Fielding, MD Orthopedic Surgeon

## 2022-04-16 ENCOUNTER — Telehealth: Payer: Self-pay | Admitting: Physical Medicine and Rehabilitation

## 2022-04-16 NOTE — Telephone Encounter (Signed)
Spoke with patient and informed him that the injection is still pending with insurance. Informed him that I will give him a call once it is approved

## 2022-04-16 NOTE — Telephone Encounter (Signed)
Patient states that Dr Laurance Flatten was suppose to get him set up with Dr Ernestina Patches for back injections. [phone] patient best contact number

## 2022-04-17 ENCOUNTER — Encounter: Payer: Self-pay | Admitting: Orthopedic Surgery

## 2022-04-17 ENCOUNTER — Ambulatory Visit (INDEPENDENT_AMBULATORY_CARE_PROVIDER_SITE_OTHER): Payer: No Typology Code available for payment source

## 2022-04-17 ENCOUNTER — Ambulatory Visit (INDEPENDENT_AMBULATORY_CARE_PROVIDER_SITE_OTHER): Payer: Medicaid Other | Admitting: Orthopedic Surgery

## 2022-04-17 DIAGNOSIS — M25561 Pain in right knee: Secondary | ICD-10-CM

## 2022-04-17 DIAGNOSIS — M25562 Pain in left knee: Secondary | ICD-10-CM

## 2022-04-17 DIAGNOSIS — G8929 Other chronic pain: Secondary | ICD-10-CM

## 2022-04-17 NOTE — Progress Notes (Signed)
Orthopedic Surgery Progress Note   Assessment: Patient is a 57 y.o. male with bilateral knee pain and OA   Plan: -Discussed quad strengthening as a treatment form that he can work on on his own -He should use Tylenol/NSAIDs as well for pain -Bilateral knee injections were provided to him in the office today -Return to office in 6 weeks.  At that visit he wanted to talk about his bilateral hips, so will obtain bilateral hip x-rays  Knee injection note: After discussing the risk, benefits, alternatives of intra-articular knee steroid injection, patient elected to proceed.  Patient was in the seated position in the right knee anterior lateral soft spot was prepped with an alcohol based prep.  Ethyl chloride was used to anesthetize the area.  A 20-gauge needle was used to sterilely inject 1 cc of Depo-Medrol, 1 cc of lidocaine, 1 cc of bupivacaine into the right knee.  Needle was withdrawn.  Band-Aid was applied.  The same procedure was then performed on the left knee.  Patient tolerated the procedure well. ___________________________________________________________________________  Subjective: Patient has been previously seen in the office for his lumbar spine.  He comes in today to talk about his bilateral knees.  He said he had pain in his knees for over a year now.  There is no trauma or injury that brought on the pain but he feels that years of heavy use have led to pain in his knees.  He feels pain mostly on the medial aspect of the knee but does feel it all over.  It is worse if he is active and it does get better if he rests.  He has tried over-the-counter medications and activity modification.  He does not get any significant relief with those treatments.  Has not noticed any swelling around his knees.  Has been able to ambulate without assistive devices.  Of note, patient has a history of a right femur fracture that underwent intramedullary fixation.  Fixation has subsequently been  removed.   Physical Exam:  General: no acute distress, appears stated age Neurologic: alert, answering questions appropriately, following commands Respiratory: unlabored breathing on room air, symmetric chest rise Psychiatric: appropriate affect, normal cadence to speech  MSK:   -Bilateral lower extremity  Minimal tenderness palpation over the medial joint line, no tenderness palpation over the remainder of the knee, palpable crepitus through range of motion  Range of motion from 0-120  Negative Lachman, negative posterior drawer, knee stable to varus and valgus stress  No palpable effusion, negative McMurray  Foot warm and well-perfused  EHL/TA/GSC intact  X-rays of the bilateral knees were obtained in the office today and were independently interpreted showing osteophyte formation in the patellofemoral compartment, medial joint space narrowing seen on the right side, radiodense material within the intramedullary canal of the right femur in the area where the nail would have been.  No fracture or dislocation seen.   Patient name: Bryan Valdez Patient MRN: [id number] Date: 04/17/22

## 2022-04-18 ENCOUNTER — Encounter: Payer: Self-pay | Admitting: Orthopedic Surgery

## 2022-04-18 NOTE — Addendum Note (Signed)
Addended by: Ileene Rubens on: 04/18/2022 01:14 PM   Modules accepted: Orders

## 2022-04-30 ENCOUNTER — Encounter: Payer: Self-pay | Admitting: Internal Medicine

## 2022-04-30 ENCOUNTER — Ambulatory Visit (INDEPENDENT_AMBULATORY_CARE_PROVIDER_SITE_OTHER): Payer: Medicaid Other | Admitting: Internal Medicine

## 2022-04-30 VITALS — BP 132/80 | HR 72 | Temp 98.0°F | Ht 72.0 in | Wt 200.2 lb

## 2022-04-30 DIAGNOSIS — M549 Dorsalgia, unspecified: Secondary | ICD-10-CM

## 2022-04-30 DIAGNOSIS — G8929 Other chronic pain: Secondary | ICD-10-CM

## 2022-04-30 DIAGNOSIS — N5089 Other specified disorders of the male genital organs: Secondary | ICD-10-CM | POA: Insufficient documentation

## 2022-04-30 DIAGNOSIS — M170 Bilateral primary osteoarthritis of knee: Secondary | ICD-10-CM | POA: Diagnosis not present

## 2022-04-30 DIAGNOSIS — Z789 Other specified health status: Secondary | ICD-10-CM | POA: Insufficient documentation

## 2022-04-30 DIAGNOSIS — L729 Follicular cyst of the skin and subcutaneous tissue, unspecified: Secondary | ICD-10-CM | POA: Insufficient documentation

## 2022-04-30 DIAGNOSIS — M5441 Lumbago with sciatica, right side: Secondary | ICD-10-CM

## 2022-04-30 DIAGNOSIS — N451 Epididymitis: Secondary | ICD-10-CM

## 2022-04-30 DIAGNOSIS — Z7251 High risk heterosexual behavior: Secondary | ICD-10-CM

## 2022-04-30 MED ORDER — DULOXETINE HCL 30 MG PO CPEP: ORAL_CAPSULE | ORAL | 3 refills | Status: DC

## 2022-04-30 MED ORDER — DOXYCYCLINE HYCLATE 100 MG PO TABS: 100.0000 mg | ORAL_TABLET | Freq: Two times a day (BID) | ORAL | 0 refills | Status: DC

## 2022-04-30 MED ORDER — CYCLOBENZAPRINE HCL 10 MG PO TABS: 10.0000 mg | ORAL_TABLET | Freq: Three times a day (TID) | ORAL | 0 refills | Status: DC | PRN

## 2022-04-30 MED ORDER — DICLOFENAC SODIUM 75 MG PO TBEC: 75.0000 mg | DELAYED_RELEASE_TABLET | Freq: Two times a day (BID) | ORAL | 0 refills | Status: DC

## 2022-04-30 MED ORDER — DICLOFENAC SODIUM 1 % EX GEL: 4.0000 g | Freq: Four times a day (QID) | CUTANEOUS | 3 refills | Status: DC | PRN

## 2022-04-30 MED ORDER — GABAPENTIN 300 MG PO CAPS: 300.0000 mg | ORAL_CAPSULE | Freq: Three times a day (TID) | ORAL | 3 refills | Status: DC

## 2022-04-30 NOTE — Assessment & Plan Note (Addendum)
Extensive conservative therapy Very bad back mri reviewed  He was afraid to go to offered surgery until failing conservative management I reviewed the MRI and agree that surgery is probably going to be needed and that conservative management is probably going to fail but I do plan to try conservative management first and I think he will be safe with this however I did advise him I do not think it is wise to go back to work or do any kind of heavy lifting with his back so bad. I do not think physical therapy is going to be able to fix this in any sort of significant long-term way but I do not think they will make it much worse either so I offered him physical therapy I gave maximum medication the short of narcotics however I advised him against going back on opioids which have worked in the past because they will only numb out the pain and enable him to do even more damage to an already surgically damaged spine.  Additionally they will create dependence and this data is showing that they do not even solve the pain in the long-term that you just get resistant to the pain relieving effects.  So if he were to use the opioids to try to manage the pain without surgery then he would just likely end up needing the surgery anyway but be hooked on opioids too.

## 2022-04-30 NOTE — Assessment & Plan Note (Signed)
History high risk sexual transmitted infection At end of visit he complaining of of tenderness / swelling in scrotum would like antibiotic(s) treatment(s) Decided to treat empirically and see back 1 week
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
## 2022-05-03 ENCOUNTER — Other Ambulatory Visit: Payer: Self-pay

## 2022-05-03 ENCOUNTER — Ambulatory Visit: Payer: No Typology Code available for payment source | Attending: Orthopedic Surgery

## 2022-05-03 DIAGNOSIS — G8929 Other chronic pain: Secondary | ICD-10-CM | POA: Insufficient documentation

## 2022-05-03 DIAGNOSIS — M25562 Pain in left knee: Secondary | ICD-10-CM | POA: Insufficient documentation

## 2022-05-03 DIAGNOSIS — M170 Bilateral primary osteoarthritis of knee: Secondary | ICD-10-CM | POA: Insufficient documentation

## 2022-05-03 DIAGNOSIS — M5416 Radiculopathy, lumbar region: Secondary | ICD-10-CM | POA: Diagnosis not present

## 2022-05-03 DIAGNOSIS — M25561 Pain in right knee: Secondary | ICD-10-CM | POA: Diagnosis present

## 2022-05-03 DIAGNOSIS — M5441 Lumbago with sciatica, right side: Secondary | ICD-10-CM | POA: Insufficient documentation

## 2022-05-03 DIAGNOSIS — M549 Dorsalgia, unspecified: Secondary | ICD-10-CM | POA: Diagnosis present

## 2022-05-03 DIAGNOSIS — M5459 Other low back pain: Secondary | ICD-10-CM | POA: Diagnosis present

## 2022-05-08 ENCOUNTER — Ambulatory Visit (INDEPENDENT_AMBULATORY_CARE_PROVIDER_SITE_OTHER): Payer: Medicaid Other | Admitting: Internal Medicine

## 2022-05-08 ENCOUNTER — Encounter: Payer: Self-pay | Admitting: Internal Medicine

## 2022-05-08 VITALS — BP 132/82 | HR 68 | Temp 98.2°F | Ht 72.0 in | Wt 197.8 lb

## 2022-05-08 DIAGNOSIS — N5089 Other specified disorders of the male genital organs: Secondary | ICD-10-CM

## 2022-05-08 DIAGNOSIS — M5441 Lumbago with sciatica, right side: Secondary | ICD-10-CM

## 2022-05-08 DIAGNOSIS — T39395A Adverse effect of other nonsteroidal anti-inflammatory drugs [NSAID], initial encounter: Secondary | ICD-10-CM

## 2022-05-08 DIAGNOSIS — K2100 Gastro-esophageal reflux disease with esophagitis, without bleeding: Secondary | ICD-10-CM | POA: Diagnosis not present

## 2022-05-08 DIAGNOSIS — Z1211 Encounter for screening for malignant neoplasm of colon: Secondary | ICD-10-CM

## 2022-05-08 DIAGNOSIS — G8929 Other chronic pain: Secondary | ICD-10-CM

## 2022-05-08 DIAGNOSIS — Z599 Problem related to housing and economic circumstances, unspecified: Secondary | ICD-10-CM

## 2022-05-08 DIAGNOSIS — K2960 Other gastritis without bleeding: Secondary | ICD-10-CM | POA: Diagnosis not present

## 2022-05-08 DIAGNOSIS — Z7251 High risk heterosexual behavior: Secondary | ICD-10-CM

## 2022-05-08 MED ORDER — CELECOXIB 200 MG PO CAPS: 200.0000 mg | ORAL_CAPSULE | Freq: Two times a day (BID) | ORAL | 3 refills | Status: DC

## 2022-05-08 MED ORDER — OMEPRAZOLE 20 MG PO CPDR: 20.0000 mg | DELAYED_RELEASE_CAPSULE | Freq: Every day | ORAL | 0 refills | Status: DC

## 2022-05-08 MED ORDER — OMEPRAZOLE 20 MG PO CPDR: 20.0000 mg | DELAYED_RELEASE_CAPSULE | Freq: Every day | ORAL | 3 refills | Status: DC

## 2022-05-08 NOTE — Patient Instructions (Addendum)
Food Insecurity: Not on file   Food Resources  Information on Locations: [place]: [employer] connecting people with various assistance programs, including food support. Dial 2-1-1 or [phone] to speak with a representative for personalized guidance on finding food resources. PokerAddress.es Feeding America: Warden/ranger with a network of food banks across the country. Their website allows you to search for food pantries and meal programs by zip code. https://www.GolfCrawler.com.cy [place]: GroupRules.gl  keeps an [place] regionally [place] (DSS): VariantTest.co.uk http://knight.com/ Provides monthly benefits on an EBT card to help buy food.    Soup [employer] and [employer] in [place]: Free Indeed Outreach Ministry [place] [address] [phone] M - F: times and dates vary - call agency  [place]:  [address] [phone] a free community meal program along with other support services  [employer] [address] [place] [phone] Provides hot meals, groceries, and clothing [employer] Dish and [place] [phone] Www.fpcgreensboro.org for mealtimes  [employer] Potter's Western & Southern Financial Pantries in [place]: GroupRules.gl  keeps an [employer] of the food services regionally [place] of His [place] Sa: 10a - 12p  on varying weeks; call agency for schedule   [facility] [place] [phone] M - F: 9:30a - 3:30p, eligibility requirements  The [place] [phone] Tu: 9a - 12p; Th 9a - 12p  Serina Cowper of Praise [address] [phone] Tu : 10a - 12p; W: 10a - 12p; Th: 10a - 12p  [facility] [address] [phone] Tu: [place]; ThAnders [place] - 11a  [facility] [address] [phone] 1st & 3rd Sa: [place] by appointment Sa: 10:30a - 11:30a  Out of the [place] [address] [place] [phone] mobile distribution sites; call agency for listing  MBL-Out of the [place] [address] [place] [phone] mobile distribution sites; call agency for listing  MBL-Out of the [place] [address] [phone] mobile distribution sites; call agency for listing  [facility] [address] [phone] W: [place] [address] [phone] 1st & 3rd Tu: 10a - 1p; 3rd Sa: 10a - 12p  One Step Further [place] M: 9:30a - 2:30p; Tu: 9:30a - 2:30p; W: 9:30a - 2:30p; Th: 9:30a - 2:30p  One Step Further [address] [phone] F: 11a - 2p  [place] Pantry [phone] [address] [id number] 2nd & 4th Th: 1p - 2:30p  [place] [address] [phone] T: 12:p -2p  Free Indeed Food Pantry [place] 3rd Sat of month: 11a-1p  [employer] Pantry [place] 4th Sa: [place] [address] [place] 3rd Tu: 5p - 7p; 3rd Sa: 9a -  1p  [facility] [place] [phone] 2nd M 5:30p - 7:30p; each W 9:30a - [place] of [place]  [phone] Tues 11a-1p call for appointment preferred  MBL - [facility] [address] [phone] mobile distribution sites; call agency for listing  Positive Direction for Youth & Families [address] [id number] M & W 6:30p - 8:30p  Pop up on Th 11a - 12:30p at different locations (call)  MBL-PD&Y [place] Thursdays 11a - 12p (please call for location)  [facility] Grocery GiveAway [address] [phone] weekly on [address] [place] [phone] 4th Saturday: [facility] [place] M-F 8am-5pm (by appt)  [facility] [place], [address] [place] [phone] M-F 8:30am-5:00pm (by appt)  [address] [place] [phone] 2nd & 4th Saturday 11am-1pm  [facility] Are One [facility] [place] [phone] Fridays 11am-2pm  [facility] Police Dept [id number] [phone] Call for schedule  Safer [place] [id number], 3rd Thursday 2:30-3:30  Tat [place] [id number] Thursdays 10am-12:30pm  [facility] [place] [id number] Sa: 9a-12p (2nd and 4th Saturday of each month)  Bread of Life [place] [phone] of [address] [place]  Every third [place] [facility]  [address]  [phone]) [phone].   Tuesdays from 5:30 PM to 7:30 PM and Saturdays from 10:00 AM to 12:00 PM.  Mobile Market ([place]  [place] Food pantry is healthy food, they want to get appointments on healthy eating.   Mobile Food Pantries: Higher education careers adviser (Out of the [employer]): Offers a Financial risk analyst with fresh produce, bread, meat, and non-perishable food. (Address: [place], [place], [place] - Phone number not available) [place], [place]): Wednesdays from 11:30 AM to 1:00 PM. Phone number: [phone] (https://www.freefood.org/l/[place]-presbyterian-church) Bread of Life Food Bank ([place], [place]): Located at [address], [place], [place] [address]. Phone number: [phone]. Family Market ([place], [place]): Located at [address], [place], [place] [address]. Phone number: [phone]. Tuesdays from 5:30 PM to 7:30 PM and Saturdays from 10:00 AM to 12:00 PM. Database administrator of [employer]: While not located in [place], they offer a Financial risk analyst program that may serve some Gay residents. Contact for details on eligibility and service areas. - Phone number: [phone] (https://seniorcarewesternguilford.com/in-home-senior-care-services/)   [place], [place]: Transportation Resources   Individualized Support: [place]: [employer] connecting people with transportation resources. Dial 211 or visit PokerAddress.es.  Dial 2-1-1 or [phone]. They can help you find transportation options in your area based on your specific needs.  Public Transportation:  [employer] (GTA): Operates a fixed-route bus system with over 19 routes serving most areas of [place]. Offers real-time bus tracking and trip planning tools through their website and app. Provides ADA-compliant buses with ramps and designated seating for individuals with disabilities. Sells day passes, weekly passes, and monthly passes to make riding more affordable. Offers reduced fares for seniors (  65+) and individuals with disabilities who meet eligibility requirements.   Offers paratransit SCAT (Shared-Ride Cab Alternative): for eligible riders  with disabilities who cannot use fixed-route buses due to their disability. Phone number: [phone] https://www.[place].gov/departments/transit  [place] curb-to-curb bus service. Eligibility: Open to all. FeesVary, depending on service and route. There is no fee for people age 31 and over. Visit website to download application or call to have one mailed. Call to check for eligibility. BingoPublishing.hu Phone: [phone] (Toll-Free) or [phone] (Main)  PART ([place]):  Operates commuter bus routes connecting [place], [place], [place], and other areas in the [place] region.  Offers weekday service with limited Saturday service on some routes.  JokeRule.co.nz  Phone number: [phone]   Volunteer Transportation Services:  Chemical engineer (VTG): Provides non-emergency medical transportation services for seniors and individuals with disabilities who have difficulty accessing traditional transportation means. Serves residents of [place], [place]. Offers rides to medical appointments, grocery shopping, and other essential errands. May require scheduling rides in advance due to volunteer availability. Contact VTG through their website.  https://volunteergso.CheapToothpicks.si   [employer] Mutual (GUM): Offers transportation assistance for some programs and services they provide, such as their food pantry or medical clinic. Availability and eligibility might vary depending on the specific program. Contact GUM directly for details on transportation assistance for their programs.  https://www.greensborourbanministry.org/ Phone number: ([phone]  [place] Programs:  [place] (DSS): May  offer transportation vouchers or reimbursements for medical appointments in certain circumstances, particularly for Medicaid recipients. Eligibility depends on individual circumstances, medical needs, and program availability. Contact DSS for details on eligibility requirements and the application process. VariantTest.co.uk [employer] or religious charities:  Some faith-based or local community organizations may offer limited ride-sharing assistance for essential needs like medical appointments or grocery shopping. Availability and eligibility criteria vary greatly. Research local organizations in your area to see if they offer transportation assistance programs.  [place]  Medicare Medicare itself typically  does not directly cover non-emergency medical transportation (NEMT) for doctor appointments.  This means Original Medicare (Parts A & B) usually won't pay for rides to routine checkups or doctor visits.  However, there are some exceptions:  Ambulance transport in emergencies would likely be covered by Medicare Part B. Some Medicare Advantage Plans (Part C): These are private insurance plans that may cover non-emergency medical transportation as part of their benefits package. It's important to check with your specific Medicare Advantage plan to see if NEMT is covered and what the limitations might be.  Medicaid: Medicaid programs are administered by each state, so coverage can vary. However, Medicaid generally does cover non-emergency medical transportation for eligible individuals to get to and from doctor's appointments for Medicaid-covered services. This means Medicaid may pay for rides to [facility]der [facility], hospitals, or other healthcare providers for approved treatments. Here are some resources to learn more:  Medicare Transportation:  https://www.ehealthinsurance.com/medicare/ Let Medicaid Give You a Ride: http://www.lawrence.com/   Remember:  Always  check with your specific health insurance plan (Medicare Advantage, commercial, or Medicaid) to confirm  coverage details for non-emergency medical transportation.**    It was a pleasure seeing you today!  Your health and satisfaction are my top priorities. If you believe your experience today was worthy of a 5-star rating, I'd be grateful for your feedback! Loralee Pacas, MD   CHECKOUT CHECKLIST  '[]'$    Schedule next appointment(s):  Return in about 2 weeks (around 05/22/2022) for chronic disease monitoring and management.  Any requested lab visits should be scheduled as appointments too  If you are not doing well:  Return to the office sooner Please bring all your medicine bottles to each appointment If your condition begins to worsen or become severe:  go to the emergency room or even call 911  '[]'$    Sign release of information authorizations: Any records we need for your care and to be your medical home  '[]'$   (Optional):  Review your clinical notes on MyChart after they are completed.     Today's draft of the physician documented plan for today's visit: (final revisions will be visible on MyChart chart later) Colon cancer screening -     Omeprazole; Take 1 capsule (20 mg total) by mouth daily.  Dispense: 30 capsule; Refill: 0  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-sided sciatica -     Cologuard -     Celecoxib; Take 1 capsule (200 mg total) by mouth 2 (two) times daily.  Dispense: 180 capsule; Refill: 3  Gastroesophageal reflux disease with esophagitis without hemorrhage  Scrotal mass -     US SCROTUM; Future  Financial difficulties  Risk for sexually transmitted disease -     Urine cytology ancillary only; Future      QUESTIONS & CONCERNS: CLINICAL: please contact us via  phone [phone] OR MyChart messaging  LAB & IMAGING:   We will call you if the results are significantly abnormal or you don't use MyChart.  Most normal results will be posted to MyChart immediately and have a clinical review message by Dr. Randol Kern posted within 2-3 business days.   If you have not heard from [place] regarding the results in 2 weeks OR if you need priority reporting, please contact this office. MYCHART:  The fastest way to get your results and easiest way to stay in touch with [place] is by activating your My Chart account. Instructions are located on the last page of this paperwork.  BILLING: xray and lab orders are billed from separate companies and questions./concerns should be directed to the [place].  For visit charges please discuss with our administrative services COMPLAINTS:  please let Dr. Randol Kern know or see the [place], by asking at the front desk: we want you to be satisfied with every experience and we would be grateful for the opportunity to address any problems

## 2022-05-09 ENCOUNTER — Encounter: Payer: Self-pay | Admitting: Internal Medicine

## 2022-05-09 DIAGNOSIS — K2100 Gastro-esophageal reflux disease with esophagitis, without bleeding: Secondary | ICD-10-CM | POA: Insufficient documentation

## 2022-05-09 NOTE — Assessment & Plan Note (Signed)
I continue to support for full permanent total disability I gave resources Social determinants of health significant for this patient, limiting care options

## 2022-05-09 NOTE — Assessment & Plan Note (Signed)
Will get sexual transmitted infection urine evaluation  Will get ultrasound

## 2022-05-09 NOTE — Assessment & Plan Note (Signed)
g
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
## 2022-05-10 ENCOUNTER — Other Ambulatory Visit: Payer: Self-pay | Admitting: Internal Medicine

## 2022-05-10 DIAGNOSIS — G8929 Other chronic pain: Secondary | ICD-10-CM
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
## 2022-05-11 ENCOUNTER — Ambulatory Visit: Payer: No Typology Code available for payment source

## 2022-05-11 DIAGNOSIS — M5459 Other low back pain: Secondary | ICD-10-CM

## 2022-05-11 DIAGNOSIS — G8929 Other chronic pain: Secondary | ICD-10-CM

## 2022-05-11 DIAGNOSIS — M5441 Lumbago with sciatica, right side: Secondary | ICD-10-CM | POA: Diagnosis not present

## 2022-05-14 ENCOUNTER — Ambulatory Visit: Payer: No Typology Code available for payment source

## 2022-05-14 DIAGNOSIS — G8929 Other chronic pain: Secondary | ICD-10-CM

## 2022-05-14 DIAGNOSIS — M5459 Other low back pain: Secondary | ICD-10-CM | POA: Diagnosis not present

## 2022-05-14 DIAGNOSIS — M5441 Lumbago with sciatica, right side: Secondary | ICD-10-CM | POA: Diagnosis not present
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
## 2022-05-20 ENCOUNTER — Ambulatory Visit: Payer: No Typology Code available for payment source

## 2022-05-20 DIAGNOSIS — M5459 Other low back pain: Secondary | ICD-10-CM | POA: Diagnosis not present

## 2022-05-20 DIAGNOSIS — G8929 Other chronic pain: Secondary | ICD-10-CM

## 2022-05-20 DIAGNOSIS — M549 Dorsalgia, unspecified: Secondary | ICD-10-CM

## 2022-05-20 DIAGNOSIS — M170 Bilateral primary osteoarthritis of knee: Secondary | ICD-10-CM

## 2022-05-20 DIAGNOSIS — M5441 Lumbago with sciatica, right side: Secondary | ICD-10-CM | POA: Diagnosis not present
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
## 2022-05-22 ENCOUNTER — Ambulatory Visit: Payer: No Typology Code available for payment source

## 2022-05-22 ENCOUNTER — Other Ambulatory Visit (HOSPITAL_COMMUNITY)
Admission: RE | Admit: 2022-05-22 | Discharge: 2022-05-22 | Disposition: A | Discharge status: Home / Self Care | Payer: Medicaid Other | Source: Ambulatory Visit | Attending: Internal Medicine | Admitting: Internal Medicine

## 2022-05-22 ENCOUNTER — Ambulatory Visit (INDEPENDENT_AMBULATORY_CARE_PROVIDER_SITE_OTHER): Payer: Medicaid Other | Admitting: Internal Medicine

## 2022-05-22 ENCOUNTER — Encounter: Payer: Self-pay | Admitting: Internal Medicine

## 2022-05-22 ENCOUNTER — Other Ambulatory Visit: Payer: Self-pay | Admitting: Internal Medicine

## 2022-05-22 VITALS — BP 112/80 | HR 64 | Temp 97.3°F | Ht 72.0 in | Wt 201.2 lb

## 2022-05-22 DIAGNOSIS — M5441 Lumbago with sciatica, right side: Secondary | ICD-10-CM | POA: Diagnosis not present

## 2022-05-22 DIAGNOSIS — K2100 Gastro-esophageal reflux disease with esophagitis, without bleeding: Secondary | ICD-10-CM

## 2022-05-22 DIAGNOSIS — M5459 Other low back pain: Secondary | ICD-10-CM | POA: Diagnosis not present

## 2022-05-22 DIAGNOSIS — R300 Dysuria: Secondary | ICD-10-CM | POA: Insufficient documentation

## 2022-05-22 DIAGNOSIS — Z599 Problem related to housing and economic circumstances, unspecified: Secondary | ICD-10-CM | POA: Diagnosis not present

## 2022-05-22 DIAGNOSIS — M542 Cervicalgia: Secondary | ICD-10-CM | POA: Diagnosis not present

## 2022-05-22 DIAGNOSIS — G8929 Other chronic pain: Secondary | ICD-10-CM

## 2022-05-22 DIAGNOSIS — M170 Bilateral primary osteoarthritis of knee: Secondary | ICD-10-CM

## 2022-05-22 DIAGNOSIS — N5089 Other specified disorders of the male genital organs: Secondary | ICD-10-CM

## 2022-05-22 MED ORDER — SIMETHICONE 80 MG PO CHEW: 80.0000 mg | CHEWABLE_TABLET | Freq: Four times a day (QID) | ORAL | 0 refills | Status: DC | PRN

## 2022-05-22 MED ORDER — PEPCID COMPLETE 10-800-165 MG PO CHEW: 1.0000 | CHEWABLE_TABLET | Freq: Every day | ORAL | 11 refills | Status: DC | PRN

## 2022-05-22 MED ORDER — SULFAMETHOXAZOLE-TRIMETHOPRIM 800-160 MG PO TABS: 1.0000 | ORAL_TABLET | Freq: Two times a day (BID) | ORAL | 0 refills | Status: DC
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
## 2022-05-22 NOTE — Assessment & Plan Note (Addendum)
I personally reinterpreted and reviewed mri images with patient today.  I continue to [place] ([facility]) for this ;and we discussed the possible surgical options that might be explored while looking at [facility].
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
## 2022-05-22 NOTE — Assessment & Plan Note (Signed)
Call [phone] for max resources

## 2022-05-22 NOTE — Assessment & Plan Note (Signed)
Improving but needs to stop spicy food, replace Tums with pepcid complete for breakthrough Encouraged continuing with Celebrex.

## 2022-05-22 NOTE — Assessment & Plan Note (Signed)
?   Recurrent scrotal infection Will get sexual transmitted infection screening and extend antibiotic(s) since it did not fully resolve with doxycycline.

## 2022-05-23 ENCOUNTER — Other Ambulatory Visit: Payer: Self-pay

## 2022-05-23 ENCOUNTER — Encounter: Payer: Self-pay | Admitting: Internal Medicine

## 2022-05-23 DIAGNOSIS — R319 Hematuria, unspecified: Secondary | ICD-10-CM | POA: Insufficient documentation

## 2022-05-23 LAB — URINALYSIS, COMPLETE
Bacteria, UA: NONE SEEN /HPF
Bilirubin Urine: NEGATIVE
Glucose, UA: NEGATIVE
Hgb urine dipstick: NEGATIVE
Hyaline Cast: NONE SEEN /LPF
Ketones, ur: NEGATIVE
Leukocytes,Ua: NEGATIVE
Nitrite: NEGATIVE
Protein, ur: NEGATIVE
Specific Gravity, Urine: 1.018 (ref 1.001–1.035)
Squamous Epithelial / HPF: NONE SEEN /HPF (ref <=5)
WBC, UA: NONE SEEN /HPF (ref 0–5)
pH: 5.5 (ref 5.0–8.0)

## 2022-05-23 LAB — URINE CYTOLOGY ANCILLARY ONLY
Chlamydia: NEGATIVE
Comment: NEGATIVE
Comment: NEGATIVE
Comment: NORMAL
Neisseria Gonorrhea: NEGATIVE
Trichomonas: NEGATIVE

## 2022-05-23 NOTE — Progress Notes (Signed)
Refer to urology and Please place any orders needed / requested. Arrange/confirm follow up appt(s).

## 2022-05-27 ENCOUNTER — Ambulatory Visit: Payer: Medicaid Other | Attending: Orthopedic Surgery

## 2022-05-27 DIAGNOSIS — G8929 Other chronic pain: Secondary | ICD-10-CM | POA: Diagnosis present

## 2022-05-27 DIAGNOSIS — M5441 Lumbago with sciatica, right side: Secondary | ICD-10-CM | POA: Diagnosis present

## 2022-05-27 DIAGNOSIS — M549 Dorsalgia, unspecified: Secondary | ICD-10-CM | POA: Insufficient documentation

## 2022-05-27 DIAGNOSIS — M170 Bilateral primary osteoarthritis of knee: Secondary | ICD-10-CM | POA: Diagnosis present
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
## 2022-05-29 ENCOUNTER — Ambulatory Visit (INDEPENDENT_AMBULATORY_CARE_PROVIDER_SITE_OTHER): Payer: Medicaid Other | Admitting: Orthopedic Surgery

## 2022-05-29 ENCOUNTER — Other Ambulatory Visit (INDEPENDENT_AMBULATORY_CARE_PROVIDER_SITE_OTHER): Payer: No Typology Code available for payment source

## 2022-05-29 ENCOUNTER — Ambulatory Visit: Payer: Medicaid Other

## 2022-05-29 DIAGNOSIS — M25551 Pain in right hip: Secondary | ICD-10-CM

## 2022-05-29 DIAGNOSIS — M25552 Pain in left hip: Secondary | ICD-10-CM

## 2022-05-29 DIAGNOSIS — M170 Bilateral primary osteoarthritis of knee: Secondary | ICD-10-CM

## 2022-05-29 DIAGNOSIS — G8929 Other chronic pain: Secondary | ICD-10-CM

## 2022-05-29 NOTE — Progress Notes (Signed)
Orthopedic Surgery Progress Note     Assessment: Patient is a 57 y.o. male with bilateral hip pain   Plan: -Is already established with pain management so should continue to see them -Discussed bilateral hip injection for pain relief.  Had him schedule appoint with Dr. Rolena Infante on the way out for that -Can perform repeat knee injections at the end of May which would be 3 months from the last  ___________________________________________________________________________   Subjective: Patient has been previously seen in the office for his lumbar spine and then subsequently for his knees.  He now says that his knee pain is better but he is having bilateral hip pain.  His low back pain has returned as well.  He says that his hip pain is the worst pain right now.  He feels it on a daily basis.  He feels it in the groin region on the left side and in the lateral aspect on the right side.  Pain is worse with weightbearing and improves with the lays down.     Physical Exam:   General: no acute distress, appears stated age Neurologic: alert, answering questions appropriately, following commands Respiratory: unlabored breathing on room air, symmetric chest rise Psychiatric: appropriate affect, normal cadence to speech   MSK:    -Bilateral lower extremity             Positive FADIR, negative FABER, no tenderness palpation over the greater trochanter, flexion to 100 degrees, internal rotation to 30 degrees, external rotation to 50 degrees, EHL/TA/GSC intact, sensation intact light touch in sural/saphenous/deep peroneal/superficial peroneal/tibial nerve distributions, feet warm and well-perfused   X-rays of the bilateral hips taken on 05/29/2022 were independently reviewed and interpreted, showing minimal joint space narrowing and no other significant degenerative changes.  No fracture or dislocation.  Evidence of prior intramedullary rod within the right femur that has been removed.     Patient name:  Bryan Valdez Patient MRN: [id number] Date: 05/29/22
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
## 2022-06-03 ENCOUNTER — Ambulatory Visit: Payer: Medicaid Other

## 2022-06-03 DIAGNOSIS — G8929 Other chronic pain: Secondary | ICD-10-CM

## 2022-06-03 DIAGNOSIS — M170 Bilateral primary osteoarthritis of knee: Secondary | ICD-10-CM | POA: Diagnosis not present

## 2022-06-03 DIAGNOSIS — M549 Dorsalgia, unspecified: Secondary | ICD-10-CM
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
## 2022-06-04 ENCOUNTER — Encounter: Payer: Self-pay | Admitting: Sports Medicine

## 2022-06-04 ENCOUNTER — Ambulatory Visit (INDEPENDENT_AMBULATORY_CARE_PROVIDER_SITE_OTHER): Payer: Medicaid Other | Admitting: Sports Medicine

## 2022-06-04 ENCOUNTER — Other Ambulatory Visit: Payer: Self-pay

## 2022-06-04 DIAGNOSIS — M25551 Pain in right hip: Secondary | ICD-10-CM

## 2022-06-04 DIAGNOSIS — M25552 Pain in left hip: Secondary | ICD-10-CM | POA: Diagnosis not present

## 2022-06-04 MED ORDER — METHYLPREDNISOLONE ACETATE 40 MG/ML IJ SUSP
40.0000 mg | INTRAMUSCULAR | Status: AC | PRN
Start: 2022-06-04 — End: 2022-06-04
  Administered 2022-06-04: 40 mg via INTRA_ARTICULAR

## 2022-06-04 MED ORDER — LIDOCAINE HCL 1 % IJ SOLN
4.0000 mL | INTRAMUSCULAR | Status: AC | PRN
Start: 2022-06-04 — End: 2022-06-04
  Administered 2022-06-04: 4 mL

## 2022-06-04 NOTE — Progress Notes (Signed)
   Procedure Note  Patient: Bryan Valdez             Date of Birth: 03-18-1965           MRN: [id number]             Visit Date: 06/04/2022  Procedures: Visit Diagnoses:  1. Bilateral hip pain    Large Joint Inj: L hip joint on 06/04/2022 2:44 PM Indications: pain Details: 22 G 3.5 in needle, ultrasound-guided anterior approach Medications: 4 mL lidocaine 1 %; 40 mg methylPREDNISolone acetate 40 MG/ML Outcome: tolerated well, no immediate complications  Procedure: US-guided intra-articular hip injection, left After discussion on risks/benefits/indications and informed verbal consent was obtained, a timeout was performed. Patient was lying supine on exam table. The hip was cleaned with betadine and alcohol swabs. Then utilizing ultrasound guidance, the patient's femoral head and neck junction was identified and subsequently injected with 4:1 lidocaine:depomedrol via an in-plane approach with ultrasound visualization of the injectate administered into the hip joint. Patient tolerated procedure well without immediate complications.  Procedure, treatment alternatives, risks and benefits explained, specific risks discussed. Consent was given by the patient. Immediately prior to procedure a time out was called to verify the correct patient, procedure, equipment, support staff and site/side marked as required. Patient was prepped and draped in the usual sterile fashion.    Large Joint Inj: R hip joint on 06/04/2022 2:44 PM Indications: pain Details: 22 G 3.5 in needle, ultrasound-guided anterior approach Medications: 4 mL lidocaine 1 %; 40 mg methylPREDNISolone acetate 40 MG/ML Outcome: tolerated well, no immediate complications  Procedure: US-guided intra-articular hip injection, right After discussion on risks/benefits/indications and informed verbal consent was obtained, a timeout was performed. Patient was lying supine on exam table. The hip was cleaned with betadine and alcohol swabs. Then  utilizing ultrasound guidance, the patient's femoral head and neck junction was identified and subsequently injected with 4:1 lidocaine:depomedrol via an in-plane approach with ultrasound visualization of the injectate administered into the hip joint. Patient tolerated procedure well without immediate complications.  Procedure, treatment alternatives, risks and benefits explained, specific risks discussed. Consent was given by the patient. Immediately prior to procedure a time out was called to verify the correct patient, procedure, equipment, support staff and site/side marked as required. Patient was prepped and draped in the usual sterile fashion.    - I evaluated the patient about 10 minutes post-injection and he had good improvement in pain and range of motion - follow-up with Dr. Christell Constant as indicated; I am happy to see them as needed  Madelyn Brunner, DO Primary Care Sports Medicine Physician  [facility] - Orthopedics  This note was dictated using Dragon naturally speaking software and may contain errors in syntax, spelling, or content which have not been identified prior to signing this note.
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
## 2022-06-05 ENCOUNTER — Ambulatory Visit (INDEPENDENT_AMBULATORY_CARE_PROVIDER_SITE_OTHER): Payer: Medicaid Other | Admitting: Internal Medicine

## 2022-06-05 ENCOUNTER — Encounter: Payer: Self-pay | Admitting: Internal Medicine

## 2022-06-05 ENCOUNTER — Ambulatory Visit: Payer: Medicaid Other

## 2022-06-05 VITALS — BP 147/84 | HR 71 | Temp 98.3°F | Resp 16 | Ht 72.0 in | Wt 195.2 lb

## 2022-06-05 DIAGNOSIS — R319 Hematuria, unspecified: Secondary | ICD-10-CM

## 2022-06-05 DIAGNOSIS — M5441 Lumbago with sciatica, right side: Secondary | ICD-10-CM | POA: Diagnosis not present

## 2022-06-05 DIAGNOSIS — G8929 Other chronic pain: Secondary | ICD-10-CM

## 2022-06-05 DIAGNOSIS — Z59811 Housing instability, housed, with risk of homelessness: Secondary | ICD-10-CM | POA: Diagnosis not present

## 2022-06-05 DIAGNOSIS — Z599 Problem related to housing and economic circumstances, unspecified: Secondary | ICD-10-CM

## 2022-06-05 LAB — URINALYSIS, ROUTINE W REFLEX MICROSCOPIC
Bilirubin Urine: NEGATIVE
Ketones, ur: NEGATIVE
Leukocytes,Ua: NEGATIVE
Nitrite: NEGATIVE
Specific Gravity, Urine: >=1.03 — AB (ref 1.000–1.030)
Total Protein, Urine: NEGATIVE
Urine Glucose: NEGATIVE
Urobilinogen, UA: 0.2 (ref 0.0–1.0)
pH: 6 (ref 5.0–8.0)
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
## 2022-06-05 NOTE — Assessment & Plan Note (Signed)
Today due to the number of pills he is on to try to manage his pain I created a custom individual explanation of how to use the medications most effectively and I provided him full handouts.  Unfortunately I do not know of any other medication options at this time to better manage his pain although I recognize it is still not fully controlled even with frequent follow-up with the pain management clinic.  With this in mind I added chronic intractable pain to his problem list and I suggested that he see a cognitive behavioral therapist which basically gives emotional tools to short-circuit the pain sensations.  He doesn't have the money right now for this approach.  For Celebrex or celecoxib this medication is a nonsteroidal anti-inflammatory pain medication so it cuts down on inflammation and pain anywhere in the body.  It works for about 12 hours so you can just take it anytime you think you have inflammatory pain.  If you do not have any pain you should not take it.  For the cyclobenzaprine also known as Flexeril this is a muscle relaxer that is a little bit sedating.  It is really good for crampy achy type muscle pain like in the lower back that is keeping you awake at night because it is also sedating it can help you get to sleep but if you are having that type of pain in the daytime you can also take it even though it might make you a little sleepy.  For the Pepcid Complete or famotidine with calcium carbonate this 1 is for heartburn and you should take it only as needed when you feel reflux or burning symptoms in your stomach area.  I was hoping that you could get to where you took only this and do not take the omeprazole at all anymore because the omeprazole was just hard on your kidneys and makes it where you can get your vitamins if.  The Bactrim or sulfamethoxazole trimethoprim is an antibiotic and if you have completed your course you can stop it.  The hydrocodone acetaminophen is a controlled substance  narcotic pain medication that is considered high risk and is highly addictive.  You cannot drive with it or drink alcohol with it or take any sedative with it.  However it is the strongest pain medicine you have so when you are in a desperate situation this 1 can be helpful.  The gabapentin is an anticonvulsant pain medication that is most helpful in nerve pain.  That means that if your pain is shooting down your leg or arm or feels numbing or tingling that this type of pain is best relieved with the gabapentin and it will even outperform any of your other medications for this type of pain.  It can be taken as needed as well.  In summary every single medicine you take can be as needed only so you just have to learn to recognize when symptoms are appropriate for each specific medicine as described above.  There is 1 exception to the as needed medications and that is the duloxetine that 1 has to be taken every single day to be effective and does not work for a few weeks by taking it every single day though you will eventually get less of a negative experience when you are in pain.  The in other words it does not actually reduce pain it just makes pain more tolerable

## 2022-06-05 NOTE — Patient Instructions (Addendum)
For Celebrex or celecoxib this medication is a nonsteroidal anti-inflammatory pain medication so it cuts down on inflammation and pain anywhere in the body.  It works for about 12 hours so you can just take it anytime you think you have inflammatory pain.  If you do not have any pain you should not take it.  For the cyclobenzaprine also known as Flexeril this is a muscle relaxer that is a little bit sedating.  It is really good for crampy achy type muscle pain like in the lower back that is keeping you awake at night because it is also sedating it can help you get to sleep but if you are having that type of pain in the daytime you can also take it even though it might make you a little sleepy.  For the Pepcid Complete or famotidine with calcium carbonate this 1 is for heartburn and you should take it only as needed when you feel reflux or burning symptoms in your stomach area.  I was hoping that you could get to where you took only this and do not take the omeprazole at all anymore because the omeprazole was just hard on your kidneys and makes it where you can get your vitamins if.  The Bactrim or sulfamethoxazole trimethoprim is an antibiotic and if you have completed your course you can stop it.  The hydrocodone acetaminophen is a controlled substance narcotic pain medication that is considered high risk and is highly addictive.  You cannot drive with it or drink alcohol with it or take any sedative with it.  However it is the strongest pain medicine you have so when you are in a desperate situation this 1 can be helpful.  The gabapentin is an anticonvulsant pain medication that is most helpful in nerve pain.  That means that if your pain is shooting down your leg or arm or feels numbing or tingling that this type of pain is best relieved with the gabapentin and it will even outperform any of your other medications for this type of pain.  It can be taken as needed as well.  In summary every single medicine you  take can be as needed only so you just have to learn to recognize when symptoms are appropriate for each specific medicine as described above.   For housing:  [employer]: They offer various assistance programs including emergency financial aid for rent and utilities. They can be reached at [phone] or you can visit their website at [employer] [place] [facility] [email or web address]] for more information. [employer]: They offer various assistance programs including emergency shelter and utility assistance. You can find their [facility] location information here: [invalid URL removed]. Income Sources with Limitations:  Work From [employer]: There are legitimate work-from-home options that may be suitable for your back limitations. Look into customer service representative positions or data entry jobs. Important: Be cautious of work-from-home scams that promise high pay for little effort. Freelance Work: If you have any skills you can offer remotely, like writing, editing, or Primary school teacher, you can try freelance platforms like Advertising account planner. Disability Benefits: You may be eligible for Social Security Disability Insurance (SSDI) or [employer] Income (SSI) if your back condition significantly limits your ability to work. You can find more information and apply for benefits here: [place] security disability benefits [place] Social Security Administration (.gov) ssa.gov]. Organizations that Can Help:  [employer]: They offer employment services including job training, resume writing, and  interview skills workshops. They can help you find work that accommodates your limitations. Reach out to them at [phone] or visit their website here: [email or web address]. [employer]: This [employer] organization helps people with criminal records find employment. They can connect you with resources and employers who are open  to hiring individuals with felonies. Contact them 1at ([phone]) [phone] or visit their website here: [invalid URL removed]. Finding Work with a Felony:  While a felony can make finding work harder, there are employers who are willing to hire individuals with a criminal background. Be upfront about your conviction during the application process, but focus on your skills and what you can bring to the company.  Here are some resources that can help with finding employment with a felony:  The [employer]: [employer]: [invalid URL removed] The [employer]: [[employer] shrm org] (Look for resources on hiring individuals with a criminal record) Remember: Don't give up on finding work.  There are resources available, and with persistence, you will find a way to get back on your feet.  Here are some additional tips:  Network: Let your friends, family, and former colleagues know you're looking for work. You never know who might have a lead. 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 Be Patient: Finding the right job might take time. Keep applying and don't get discouraged.

## 2022-06-05 NOTE — Assessment & Plan Note (Signed)
Used artificial intelligence to find the following options   [employer]: They offer various assistance programs including emergency financial aid for rent and utilities. They can be reached at [phone] or you can visit their website at [employer] [place] [facility] [email or web address]] for more information. [employer]: They offer various assistance programs including emergency shelter and utility assistance. You can find their [facility] location information here: [invalid URL removed]. Income Sources with Limitations:  Work From [employer]: There are legitimate work-from-home options that may be suitable for your back limitations. Look into customer service representative positions or data entry jobs. Important: Be cautious of work-from-home scams that promise high pay for little effort. Freelance Work: If you have any skills you can offer remotely, like writing, editing, or Primary school teacher, you can try freelance platforms like Advertising account planner. Disability Benefits: You may be eligible for Social Security Disability Insurance (SSDI) or [employer] Income (SSI) if your back condition significantly limits your ability to work. You can find more information and apply for benefits here: [place] security disability benefits [place] Social Security Administration (.gov) ssa.gov]. Organizations that Can Help:  [employer]: They offer employment services including job training, resume writing, and interview skills workshops. They can help you find work that accommodates your limitations. Reach out to them at [phone] or visit their website here: [email or web address]. [employer]: This [employer] organization helps people with criminal records find employment. They can connect you with resources and employers who are open to hiring individuals with felonies. Contact them at [phone]) [phone] or visit their website here:  [invalid URL removed]. Finding Work with a Felony:  While a felony can make finding work harder, there are employers who are willing to hire individuals with a criminal background. Be upfront about your conviction during the application process, but focus on your skills and what you can bring to the company.  Here are some resources that can help with finding employment with a felony:  The [employer]: [employer]: [invalid URL removed] The [employer]: [[employer] shrm org] (Look for resources on hiring individuals with a criminal record) Remember: Don't give up on finding work.  There are resources available, and with persistence, you will find a way to get back on your feet.  Here are some additional tips:  Network: Let your friends, family, and former colleagues know you're looking for work. You never know who might have a lead. 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 Be Patient: Finding the right job might take time. Keep applying and don't get discouraged.

## 2022-06-05 NOTE — Assessment & Plan Note (Addendum)
Currently on 1st day bactrim Will recheck urinalysis Seems to be resolved but will double check Urology pending... he still has epididymal symptom(s) ? Scrotal mass.

## 2022-06-14 ENCOUNTER — Ambulatory Visit: Payer: Medicaid Other | Admitting: Internal Medicine
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
## 2022-06-18 ENCOUNTER — Ambulatory Visit: Payer: Medicaid Other

## 2022-06-19 ENCOUNTER — Ambulatory Visit: Payer: Medicaid Other

## 2022-06-19 ENCOUNTER — Ambulatory Visit (INDEPENDENT_AMBULATORY_CARE_PROVIDER_SITE_OTHER): Payer: Medicaid Other | Admitting: Internal Medicine

## 2022-06-19 ENCOUNTER — Encounter: Payer: Self-pay | Admitting: Internal Medicine

## 2022-06-19 VITALS — BP 122/88 | HR 64 | Temp 97.9°F | Ht 72.0 in | Wt 194.0 lb

## 2022-06-19 DIAGNOSIS — M170 Bilateral primary osteoarthritis of knee: Secondary | ICD-10-CM | POA: Diagnosis not present

## 2022-06-19 DIAGNOSIS — Z599 Problem related to housing and economic circumstances, unspecified: Secondary | ICD-10-CM

## 2022-06-19 DIAGNOSIS — Z59811 Housing instability, housed, with risk of homelessness: Secondary | ICD-10-CM

## 2022-06-19 DIAGNOSIS — G8929 Other chronic pain: Secondary | ICD-10-CM

## 2022-06-19 DIAGNOSIS — M549 Dorsalgia, unspecified: Secondary | ICD-10-CM

## 2022-06-19 DIAGNOSIS — N5089 Other specified disorders of the male genital organs: Secondary | ICD-10-CM | POA: Diagnosis not present

## 2022-06-19 DIAGNOSIS — M5441 Lumbago with sciatica, right side: Secondary | ICD-10-CM

## 2022-06-19 DIAGNOSIS — M795 Residual foreign body in soft tissue: Secondary | ICD-10-CM

## 2022-06-19 NOTE — Assessment & Plan Note (Signed)
Encouraged patient to to not wait onlandlord to get fed up, go ahead and evaluate hud housing options while continuing to call Social Security Disability Insurance (SSDI) group daily.

## 2022-06-19 NOTE — Assessment & Plan Note (Signed)
He called [phone], can't get ebt.

## 2022-06-19 NOTE — Assessment & Plan Note (Signed)
Has a lump in the testicles and gets pain at night sometimes associated with this we have a urology referral pending but he says he has not heard from them so I am putting there information for him to contact them here and will be available through his MyChart Address: [address] [place] [address] Phone: [phone] Fax: [phone]

## 2022-06-19 NOTE — Assessment & Plan Note (Addendum)
Today I copied and pasted a summary of all of the visits of hide with Bryan Valdez in the last month 5 visits total.  He expressed some concern about the number of doctor visits he has had with me and others and was hoping he could cut it back as he is already having tremendous financial stress.  I encouraged him to keep seeing all of the physicians and continue fighting SSDI because he does not have this award and I feel strongly that he deserves.  I updated the problem overview for disabling back pain as seen elsewhere in today's visit and I encouraged him to use in his discussions and efforts to get approved for SSDI claim.  Regarding visits with me I explained that there is nothing that is an emergency medically except for his financial situation.  So if it is hurting his financial situation he can skip any visit he has with me but my visits are probably one of the best solutions to the financial problem if he can use them to obtain an SSDI award.  This is in part because his back is just so bad that he is unlikely to be able to do any gainful employment of any kind to have any other way to support himself although I am aware he is trying very hard to find such as something that could work anyway.  Continue with DULoxetine, HYDROcodone-acetaminophen, celecoxib, cyclobenzaprine, diclofenac Sodium, famotidine-calcium carbonate-magnesium hydroxide, gabapentin, omeprazole, oxyCODONE-acetaminophen, simethicone, and sulfamethoxazole-trimethoprim Given him the hydrocodone he is now getting oxycodone through pain management so I will defer all opioid prescribing decisions to pain management specialist
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
## 2022-06-19 NOTE — Patient Instructions (Addendum)
It was a pleasure seeing you today!  Your health and satisfaction are my top priorities. If you believe your experience today was worthy of a 5-star rating, I'd be grateful for your feedback! [name], MD   Next Steps: Schedule Follow-Up:  If any of your medical issues become urgent or worsen, please don't hesitate to reach out or seek emergency room care. In the meantime, I strongly encourage scheduling routine follow up appointments prior to leaving or calling [phone] if you don't have one scheduled yet.  We recommend your next follow-up appointment no later than Return for chronic disease monitoring and management.  Please return sooner if you are not doing well.  Preventive Care:  Don't forget to schedule your annual preventive care visit!  This important checkup is typically covered by insurance and helps identify potential health issues early.  Typically its 100% insurance covered with no co-pay and helps to get surveillance labwork paid for.  Lab & X-ray Appointments:  Scheduled any incomplete lab tests today or call us to schedule.  XRays can be done without an appointment at [facility] at [facility] ([address], [address]), M-F 8:30am-noon or 1pm-5pm.  Just tell them you're there for X-rays ordered by Dr. Jon Billings.  We'll receive the results and contact you by phone or MyChart to discuss next steps.  Medical Information Release:  If you have any relevant medical information we don't have, please sign a release form so we can obtain it for your records.  Bring to Your Next Appointment: Medications: Please bring all your medication bottles to your next appointment to ensure we have an accurate record of your prescriptions. Health Diaries: If you're monitoring any health conditions at home, keeping a diary of your readings can be very helpful for discussions at your next appointment.  Please Review your early draft clinical notes below and the final encounter summary  tomorrow on MyChart after its been completed.   Disabling back pain Assessment & Plan: Today I copied and pasted a summary of all of the visits of hide with Mr. Coate in the last month 5 visits total.  He expressed some concern about the number of doctor visits he has had with me and others and was hoping he could cut it back as he is already having tremendous financial stress.  I encouraged him to keep seeing all of the physicians and continue fighting SSDI because he does not have this award and I feel strongly that he deserves.  I updated the problem overview for disabling back pain as seen elsewhere in today's visit and I encouraged him to use in his discussions and efforts to get approved for SSDI claim.  Regarding visits with me I explained that there is nothing that is an emergency medically except for his financial situation.  So if it is hurting his financial situation he can skip any visit he has with me but my visits are probably one of the best solutions to the financial problem if he can use them to obtain an SSDI award.  This is in part because his back is just so bad that he is unlikely to be able to do any gainful employment of any kind to have any other way to support himself although I am aware he is trying very hard to find such as something that could work anyway.  Continue with DULoxetine, HYDROcodone-acetaminophen, celecoxib, cyclobenzaprine, diclofenac Sodium, famotidine-calcium carbonate-magnesium hydroxide, gabapentin, omeprazole, oxyCODONE-acetaminophen, simethicone, and sulfamethoxazole-trimethoprim Given him the hydrocodone  he is now getting oxycodone through pain management so I will defer all opioid prescribing decisions to pain management specialist   Financial difficulties Assessment & Plan: He called 211, can't get ebt.     Housing instability, currently housed, at risk for homelessness Assessment & Plan: Encouraged patient to to not wait onlandlord to get fed up, go  ahead and evaluate hud housing options while continuing to call Social Security Disability Insurance (SSDI) group daily.   Scrotal mass Assessment & Plan: Has a lump in the testicles and gets pain at night sometimes associated with this we have a urology referral pending but he says he has not heard from them so I am putting there information for him to contact them here and will be available through his MyChart Address: [address] [place] [address] Phone: [phone] Fax: [phone]      Getting Answers and Following Up: Simple Questions & Concerns: For quick questions or basic follow-up after your visit, reach [place] at [phone] or MyChart messaging. Complex Concerns: If your concern is more complex, scheduling an appointment might be best. Discuss this with the staff to find the most suitable option. Lab & Imaging Results: We'll contact you directly if results are abnormal or you don't use MyChart. Most normal results will be on MyChart within 2-3 business days, with a review message from Dr. Jon Billings. Haven't heard back in 2 weeks? Need results sooner? Contact us at [phone]. Referrals: Our referral coordinator will manage specialist referrals. The specialist's office should contact you within 2 weeks to schedule an appointment. Call us if you haven't heard from them after 2 weeks.  Staying Connected:  MyChart: Activate your MyChart for the fastest way to access results and message [place]. See the last page of this paperwork for instructions.  Billing: X-ray & Lab Orders: These are billed by separate companies. Contact the invoicing company directly for questions or concerns. Visit Charges: Discuss any billing inquiries with our administrative services team.  Feedback & Satisfaction: Share Your Experience: We strive for your satisfaction! If you have any complaints, please let Dr. Jon Billings know directly or contact our Practice Administrators, Edwena Felty or [employer], by asking at the front desk.  Scheduling Tips: Shorter Wait Times: 8 am and 1 pm appointments often have the quickest wait times. Longer Appointments: If you need more time during your visit, talk to the front desk. Due to insurance regulations, multiple back-to-back appointments might be necessary.
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
## 2022-06-20 DIAGNOSIS — M795 Residual foreign body in soft tissue: Secondary | ICD-10-CM | POA: Insufficient documentation

## 2022-06-24 ENCOUNTER — Other Ambulatory Visit: Payer: Self-pay | Admitting: Internal Medicine

## 2022-06-24 DIAGNOSIS — G8929 Other chronic pain: Secondary | ICD-10-CM

## 2022-07-11 ENCOUNTER — Other Ambulatory Visit: Payer: Self-pay | Admitting: Internal Medicine

## 2022-07-11 DIAGNOSIS — Z789 Other specified health status: Secondary | ICD-10-CM

## 2022-07-11 DIAGNOSIS — G8929 Other chronic pain: Secondary | ICD-10-CM

## 2022-07-11 DIAGNOSIS — Z59811 Housing instability, housed, with risk of homelessness: Secondary | ICD-10-CM

## 2022-07-11 DIAGNOSIS — Z599 Problem related to housing and economic circumstances, unspecified: Secondary | ICD-10-CM

## 2022-07-11 DIAGNOSIS — M549 Dorsalgia, unspecified: Secondary | ICD-10-CM

## 2022-07-16 ENCOUNTER — Other Ambulatory Visit: Payer: Medicaid Other
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
## 2022-07-16 NOTE — Patient Instructions (Signed)
Visit Information  Bryan Valdez was given information about Medicaid Managed Care team care coordination services as a part of their [facility] Medicaid benefit. Bryan Valdez verbally consented to engagement with the [facility] Managed Care team.   If you are experiencing a medical emergency, please call 911 or report to your local emergency department or urgent care.   If you have a non-emergency medical problem during routine business hours, please contact your provider's office and ask to speak with a nurse.   For questions related to your [facility] health plan, please call: [phone] or go here:[email or web address]  If you would like to schedule transportation through your [facility] plan, please call the following number at least 2 days in advance of your appointment: [phone].   You can also use the MTM portal or MTM mobile app to manage your rides. Reimbursement for transportation is available through [facility]! For the portal, please go to [email or web address]  Call the [facility] Crisis Line at [phone], at any time, 24 hours a day, 7 days a week. If you are in danger or need immediate medical attention call 911.  If you would like help to quit smoking, call 1-800-QUIT-NOW ((213)878-2653) OR Espaol: 1-855-Djelo-Ya (4-132-440-1027) o para ms informacin haga clic aqu or Text READY to 253-664 to register via text  Bryan Valdez - following are the goals we discussed in your visit today:   Goals Addressed   None     Social Worker will follow up in 30 days.  Gus Puma, Kenard Gower, MHA [facility]  Managed Medicaid Social Worker [phone]   Following is a copy of your plan of care:  There are no care plans that you recently modified to display for this patient.

## 2022-07-18 ENCOUNTER — Encounter: Payer: Self-pay | Admitting: Obstetrics and Gynecology

## 2022-07-18 ENCOUNTER — Other Ambulatory Visit: Payer: Medicaid Other | Admitting: Obstetrics and Gynecology
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
## 2022-07-18 NOTE — Patient Instructions (Signed)
Hi Bryan Valdez, thank you for speaking with me today-have a nice day!  Bryan Valdez was given information about Medicaid Managed Care team care coordination services as a part of their [place] Medicaid benefit. Bryan Valdez verbally consented to engagement with the [facility] Managed Care team.   If you are experiencing a medical emergency, please call 911 or report to your local emergency department or urgent care.   If you have a non-emergency medical problem during routine business hours, please contact your provider's office and ask to speak with a nurse.   For questions related to your [facility] health plan, please call: [phone] or go here:[email or web address]  If you would like to schedule transportation through your [facility] plan, please call the following number at least 2 days in advance of your appointment: [phone].   You can also use the MTM portal or MTM mobile app to manage your rides. Reimbursement for transportation is available through [facility]! For the portal, please go to [email or web address]  Call the [facility] Crisis Line at [phone], at any time, 24 hours a day, 7 days a week. If you are in danger or need immediate medical attention call 911.  If you would like help to quit smoking, call 1-800-QUIT-NOW (440-619-9631) OR Espaol: 1-855-Djelo-Ya (4-132-440-1027) o para ms informacin haga clic aqu or Text READY to 253-664 to register via text  Bryan Valdez - following are the goals we discussed in your visit today:   Goals Addressed    Timeframe:  Long-Range Goal Priority:  High Start Date:   07/18/22                          Expected End Date:   ongoing                    Follow Up Date 08/21/22    - practice safe sex - schedule appointment for vaccines needed due to my age or health - schedule recommended health tests  - schedule and keep appointment for annual check-up    Why is this important?   Screening tests can find  diseases early when they are easier to treat.  Your doctor or nurse will talk with you about which tests are important for you.  Getting shots for common diseases like the flu and shingles will help prevent them.   Bryan Valdez verbalizes understanding of instructions and care plan provided today and agrees to view in MyChart. Active MyChart status and Bryan Valdez understanding of how to access instructions and care plan via MyChart confirmed with Bryan Valdez.     The Managed Medicaid care management team will reach out to the Bryan Valdez again over the next 30 business  days.  The  Bryan Valdez  has been provided with contact information for the Managed Medicaid care management team and has been advised to call with any health related questions or concerns.   [name] RN, BSN [place]  [facility] Care Management Coordinator - Managed Medicaid High Risk [phone]   Following is a copy of your plan of care:  Care Plan : RN Care Manager Plan of Care  Updates made by [name], RN since 07/18/2022 12:00 AM     Problem: Health Promotion or Disease Self-Management (General Plan of Care)      Long-Range Goal: Chronic Disease Management and Care Coordination Needs   Start Date: 07/18/2022  Expected End Date: 10/18/2022  Priority: High  Note:   Current Barriers:  Knowledge Deficits related to plan of care for management of GERD, chronic LBP with right sciatica, osteoarthritis Care Coordination needs related to food and housing needs  Chronic Disease Management support and education needs related to GERD, chronic LBP with right sciatica, osteoarthritis  Financial Constraints   RNCM Clinical Goal(s):  Bryan Valdez will verbalize understanding of plan for management of GERD, chronic LBP with right sciatica, osteoarthritis  as evidenced by Bryan Valdez report verbalize basic understanding of GERD, chronic LBP with right sciatica, osteoarthritis  disease process and self health management plan as  evidenced by Bryan Valdez report take all medications exactly as prescribed and will call provider for medication related questions as evidenced by Bryan Valdez report demonstrate understanding of rationale for each prescribed medication as evidenced by Bryan Valdez report attend all scheduled medical appointments as evidenced by Bryan Valdez report and EMR review demonstrate ongoing adherence to prescribed treatment plan for GERD, chronic LBP with right sciatica, osteo as evidenced by Bryan Valdez report and EMR review work with Child psychotherapist to address  related to the management of food and housing needs related to the management of GERD, chronic LBP with right sciatica, osteoarthritis  as evidenced by review of EMR and Bryan Valdez or Child psychotherapist report through collaboration with Medical illustrator, provider, and care team.   Interventions: Pain Interventions:  (Status:  New goal.) Long Term Goal Pain assessment performed Medications reviewed Reviewed provider established plan for pain management Discussed importance of adherence to all scheduled medical appointments Counseled on the importance of reporting any/all new or changed pain symptoms or management strategies to pain management provider Advised Bryan Valdez to report to care team affect of pain on daily activities Assessed social determinant of health barriers  Inter-disciplinary care team collaboration (see longitudinal plan of care) Evaluation of current treatment plan related to  self management and Bryan Valdez's adherence to plan as established by provider  SDOH Barriers (Status:  New goal. and Goal on track:  Yes.) Long Term Goal Bryan Valdez interviewed and SDOH assessment performed        SDOH Interventions   Bryan Valdez interviewed and appropriate assessments performed Discussed plans with Bryan Valdez for ongoing care management follow up and provided Bryan Valdez with direct contact information for care management team Advised Bryan Valdez to contact provider regarding URO referral  and back injections 07/18/22:  Bryan Valdez has been provided with food and housing resources-5/21 by BSW  Bryan Valdez Goals/Self-Care Activities: Take all medications as prescribed Attend all scheduled provider appointments Call pharmacy for medication refills 3-7 days in advance of running out of medications Perform all self care activities independently  Perform IADL's (shopping, preparing meals, housekeeping, managing finances) independently Call provider office for new concerns or questions  Work with the social worker to address care coordination needs and will continue to work with the clinical team to address health care and disease management related needs Bryan Valdez interviewed and appropriate assessments performed Discussed plans with Bryan Valdez for ongoing care management follow up and provided Bryan Valdez with direct contact information for care management team Advised Bryan Valdez to contact provider regarding URO referral and back injections  Follow Up Plan:  The Bryan Valdez has been provided with contact information for the care management team and has been advised to call with any health related questions or concerns.  The care management team will reach out to the Bryan Valdez again over the next 30 business  days.

## 2022-07-23 ENCOUNTER — Encounter: Payer: Self-pay | Admitting: Internal Medicine

## 2022-07-23 ENCOUNTER — Ambulatory Visit (INDEPENDENT_AMBULATORY_CARE_PROVIDER_SITE_OTHER): Payer: No Typology Code available for payment source | Admitting: Internal Medicine

## 2022-07-23 VITALS — BP 110/78 | HR 56 | Temp 97.6°F | Ht 72.0 in | Wt 191.8 lb

## 2022-07-23 DIAGNOSIS — M542 Cervicalgia: Secondary | ICD-10-CM

## 2022-07-23 DIAGNOSIS — R399 Unspecified symptoms and signs involving the genitourinary system: Secondary | ICD-10-CM

## 2022-07-23 DIAGNOSIS — R319 Hematuria, unspecified: Secondary | ICD-10-CM

## 2022-07-23 DIAGNOSIS — M5441 Lumbago with sciatica, right side: Secondary | ICD-10-CM | POA: Diagnosis not present

## 2022-07-23 DIAGNOSIS — K2100 Gastro-esophageal reflux disease with esophagitis, without bleeding: Secondary | ICD-10-CM

## 2022-07-23 DIAGNOSIS — M170 Bilateral primary osteoarthritis of knee: Secondary | ICD-10-CM

## 2022-07-23 DIAGNOSIS — M549 Dorsalgia, unspecified: Secondary | ICD-10-CM

## 2022-07-23 DIAGNOSIS — Z59811 Housing instability, housed, with risk of homelessness: Secondary | ICD-10-CM

## 2022-07-23 DIAGNOSIS — R300 Dysuria: Secondary | ICD-10-CM

## 2022-07-23 DIAGNOSIS — G8929 Other chronic pain: Secondary | ICD-10-CM

## 2022-07-23 DIAGNOSIS — Z599 Problem related to housing and economic circumstances, unspecified: Secondary | ICD-10-CM

## 2022-07-23 LAB — URINALYSIS, ROUTINE W REFLEX MICROSCOPIC
Bilirubin Urine: NEGATIVE
Ketones, ur: NEGATIVE
Nitrite: NEGATIVE
Specific Gravity, Urine: 1.015 (ref 1.000–1.030)
Total Protein, Urine: NEGATIVE
Urine Glucose: NEGATIVE
Urobilinogen, UA: 0.2 (ref 0.0–1.0)
pH: 6 (ref 5.0–8.0)

## 2022-07-23 LAB — PSA: PSA: 1.05 ng/mL (ref 0.10–4.00)

## 2022-07-23 MED ORDER — METRONIDAZOLE 500 MG PO TABS
500.0000 mg | ORAL_TABLET | Freq: Three times a day (TID) | ORAL | 0 refills | Status: AC
Start: 2022-07-23 — End: 2022-07-30

## 2022-07-23 MED ORDER — CYCLOBENZAPRINE HCL 10 MG PO TABS
10.0000 mg | ORAL_TABLET | Freq: Three times a day (TID) | ORAL | 5 refills | Status: DC | PRN
Start: 2022-07-23 — End: 2023-02-14

## 2022-07-23 NOTE — Patient Instructions (Signed)
It was a pleasure seeing you today! Your health and satisfaction are our top priorities.   Glenetta Hew, MD  Next Steps:  [x]  Early Intervention: Schedule sooner appointment, call our on-call services, or go to emergency room if there is Increase in pain or discomfort New or worsening symptoms Sudden or severe changes in your health [x]  Flexible Follow-Up: We recommend a No follow-ups on file. for optimal routine care. This allows for progress monitoring and treatment adjustments. [x]  Preventive Care: Schedule your annual preventive care visit! It's typically covered by insurance and helps identify potential health issues early. [x]  Lab & X-ray Appointments: Incomplete tests scheduled today, or call to schedule. X-rays: [facility] (M-F, 8:30am-noon or 1pm-5pm). [x]  Medical Information Release: Sign a release form at front desk to obtain relevant medical information we don't have.  Making the Most of Our Focused (20 minute) Appointments:  [x]   Clearly state your top concerns at the beginning of the visit to focus our discussion [x]   If you anticipate you will need more time, please inform the front desk during scheduling - we can book multiple appointments in the same week. [x]   If you have transportation problems- use our convenient video appointments or ask about transportation support. [x]   We can get down to business faster if you use MyChart to update information before the visit and submit non-urgent questions before your visit. Thank you for taking the time to provide details through MyChart.  Let our nurse know and she can import this information into your encounter documents.  Arrival and Wait Times: [x]   Arriving on time ensures that everyone receives prompt attention. [x]   Early morning (8a) and afternoon (1p) appointments tend to have shortest wait times. [x]   Unfortunately, we cannot delay appointments for late arrivals or hold slots during phone  calls.  Getting Answers and Following Up  [x]   Simple Questions & Concerns: For quick questions or basic follow-up after your visit, reach [place] at [phone] or MyChart messaging. [x]   Complex Concerns: If your concern is more complex, scheduling an appointment might be best. Discuss this with the staff to find the most suitable option. [x]   Lab & Imaging Results: We'll contact you directly if results are abnormal or you don't use MyChart. Most normal results will be on MyChart within 2-3 business days, with a review message from Dr. Jon Billings. Haven't heard back in 2 weeks? Need results sooner? Contact us at [phone]. [x]   Referrals: Our referral coordinator will manage specialist referrals. The specialist's office should contact you within 2 weeks to schedule an appointment. Call us if you haven't heard from them after 2 weeks.  Staying Connected  [x]   MyChart: Activate your MyChart for the fastest way to access results and message [place]. See the last page of this paperwork for instructions on how to activate.  Bring to Your Next Appointment  [x]   Medications: Please bring all your medication bottles to your next appointment to ensure we have an accurate record of your prescriptions. [x]   Health Diaries: If you're monitoring any health conditions at home, keeping a diary of your readings can be very helpful for discussions at your next appointment.  Billing  [x]   X-ray & Lab Orders: These are billed by separate companies. Contact the invoicing company directly for questions or concerns. [x]   Visit Charges: Discuss any billing inquiries with our administrative services team.  Your Satisfaction Matters  [x]   Share Your Experience: We strive for your satisfaction!  If you have any complaints, or preferably compliments, please let Dr. Jon Billings know directly or contact our Practice Administrators, Edwena Felty or [employer], by asking at the front desk.   Reviewing Your Records  [x]    Review this early draft of your clinical encounter notes below and the final encounter summary tomorrow on MyChart after its been completed.   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-sided sciatica Assessment & Plan: Running low on flexeril will refill Pain is still uncontrolled and he can't safely do lift- so still disabling Despite outstanding adherence to massive comprehensive therapeutic plan, prognosis for recovery to be able to work again remains grim. 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  Documenting this here and continuing to offer support in Counsellor ([employer]DI) claim.    Orders: -     Cyclobenzaprine HCl; Take 1 tablet (10 mg total) by mouth 3 (three) times daily as needed for muscle spasms.  Dispense: 90 tablet; Refill: 5  Neck pain  Dysuria Assessment & Plan: Old wetprep showed trich years ago will treat as urinary tract infection (UTI) from recurrent trich but dysuria not even present right now   Orders: -     metroNIDAZOLE; Take 1 tablet (500 mg total) by mouth 3 (three) times daily for 7 days.  Dispense: 21 tablet; Refill: 0 -     Ambulatory referral to Urology -     Urinalysis, Routine w reflex microscopic -     PSA  Bilateral primary osteoarthritis of knee  Disabling back pain  Financial difficulties  Gastroesophageal reflux disease with esophagitis without hemorrhage  Housing instability, currently housed, at risk for homelessness  Lower urinary tract symptoms (LUTS) Assessment & Plan: Re-referred back to urology  Check PSA Suspicious for neurogenic bladder secondary to severe spinal stenosis lumbar  Orders: -     Ambulatory referral to Urology -     Urinalysis, Routine w reflex microscopic -     PSA  Hematuria, unspecified type Assessment & Plan: Check urinalysis again.   Check PSA Urology referral from 3/28 seems to have failed to go through, send again.

## 2022-07-23 NOTE — Assessment & Plan Note (Addendum)
Running low on flexeril will refill Pain is still uncontrolled and he can't safely do lift- so still disabling Despite outstanding adherence to massive comprehensive therapeutic plan, prognosis for recovery to be able to work again remains grim. 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  Documenting this here and continuing to offer support in Counsellor ([employer]DI) claim.

## 2022-07-23 NOTE — Assessment & Plan Note (Signed)
Old wetprep showed trich years ago will treat as urinary tract infection (UTI) from recurrent trich but dysuria not even present right now

## 2022-07-23 NOTE — Assessment & Plan Note (Addendum)
Re-referred back to urology  Check PSA Suspicious for neurogenic bladder secondary to severe spinal stenosis lumbar

## 2022-07-23 NOTE — Assessment & Plan Note (Signed)
Check urinalysis again.  Check PSA Urology referral from 3/28 seems to have failed to go through, send again.
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
## 2022-07-25 ENCOUNTER — Other Ambulatory Visit (INDEPENDENT_AMBULATORY_CARE_PROVIDER_SITE_OTHER): Payer: No Typology Code available for payment source

## 2022-07-25 ENCOUNTER — Ambulatory Visit (INDEPENDENT_AMBULATORY_CARE_PROVIDER_SITE_OTHER): Payer: No Typology Code available for payment source | Admitting: Orthopedic Surgery

## 2022-07-25 DIAGNOSIS — M21619 Bunion of unspecified foot: Secondary | ICD-10-CM

## 2022-07-25 DIAGNOSIS — M21612 Bunion of left foot: Secondary | ICD-10-CM

## 2022-07-25 NOTE — Progress Notes (Signed)
Orthopedic Surgery Progress Note     Assessment: Patient is a 57 y.o. male with left foot pain and bunion   Plan: -Is already established with pain management so should continue to see them -Has gotten relief with bilateral knee and hip injections, so told him we can do those periodically to help with his pain. He is not having any hip or pain currently, so do not recommend injection at this time -Right now, he does not feel symptomatic enough to consider surgical intervention for the bunion.  For now, we will continue to monitor -Return to office on an as needed basis   ___________________________________________________________________________   Subjective: Patient has gotten significant relief with bilateral hip injections and bilateral knee injections.  He is not having any knee or hip pain at this time.  He comes in today to talk about his left foot.  He has pain over the great toe.  He notices especially when he is wearing shoes and when driving.  He does not have any pain elsewhere in the foot.  Denies paresthesias and numbness.  There is no trauma or injury that preceded the onset of pain.  Pain has been getting progressively worse with time.     Physical Exam:   General: no acute distress, appears stated age Neurologic: alert, answering questions appropriately, following commands Respiratory: unlabored breathing on room air, symmetric chest rise Psychiatric: appropriate affect, normal cadence to speech   MSK:    -Left foot exam  -Palpable DP pulse, foot warm and well-perfused  -Bunion deformity of the great toe  -Tender to palpation over the medial aspect of the MTP joint otherwise nontender to palpation of the remainder of the foot  Sensation intact light touch in the sural/saphenous/DPN/superficial peroneal/tibial nerve distributions  -EHL/TA/GSC intact   X-rays of the left foot taken on 07/25/2022 were independently reviewed and interpreted, showing IMA of 20 degrees, HVA  of 30. No fracture or dislocation seen.      Patient name: Bryan Valdez Patient MRN: [id number] Date: 07/25/22

## 2022-08-16 ENCOUNTER — Ambulatory Visit: Payer: Medicaid Other

## 2022-08-20 ENCOUNTER — Other Ambulatory Visit: Payer: Medicaid Other
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
## 2022-08-20 NOTE — Patient Instructions (Signed)
Visit Information  Bryan Valdez was given information about Medicaid Managed Care team care coordination services as a part of their [facility] Medicaid benefit. Bryan Valdez verbally consented to engagement with the [facility] Managed Care team.   If you are experiencing a medical emergency, please call 911 or report to your local emergency department or urgent care.   If you have a non-emergency medical problem during routine business hours, please contact your provider's office and ask to speak with a nurse.   For questions related to your [facility] health plan, please call: [phone] or go here:[email or web address]  If you would like to schedule transportation through your [facility] plan, please call the following number at least 2 days in advance of your appointment: [phone].   You can also use the MTM portal or MTM mobile app to manage your rides. Reimbursement for transportation is available through [facility]! For the portal, please go to [email or web address]  Call the [facility] Crisis Line at [phone], at any time, 24 hours a day, 7 days a week. If you are in danger or need immediate medical attention call 911.  If you would like help to quit smoking, call 1-800-QUIT-NOW (501-612-4069) OR Espaol: 1-855-Djelo-Ya (4-132-440-1027) o para ms informacin haga clic aqu or Text READY to 253-664 to register via text  Bryan Valdez - following are the goals we discussed in your visit today:   Goals Addressed   None      Social Worker will follow up in 30-45 days.   Bryan Valdez, Bryan Valdez, MHA [facility]  Managed Medicaid Social Worker [phone]   Following is a copy of your plan of care:  There are no care plans that you recently modified to display for this patient.

## 2022-08-21 ENCOUNTER — Encounter: Payer: Self-pay | Admitting: Obstetrics and Gynecology

## 2022-08-21 ENCOUNTER — Other Ambulatory Visit: Payer: Medicaid Other | Admitting: Obstetrics and Gynecology

## 2022-08-21 NOTE — Patient Instructions (Signed)
Hey Mr. Carlile, thanks for talking to me this morning.  Please follow up with Dr. Kandra Nicolas office about the Urology referral.  Mr. Reich was given information about Medicaid Managed Care team care coordination services as a part of their [facility] Medicaid benefit. Kanen Mottola verbally consented to engagement with the [facility] Managed Care team.   If you are experiencing a medical emergency, please call 911 or report to your local emergency department or urgent care.   If you have a non-emergency medical problem during routine business hours, please contact your provider's office and ask to speak with a nurse.   For questions related to your [facility] health plan, please call: [phone] or go here:[email or web address]  If you would like to schedule transportation through your [facility] plan, please call the following number at least 2 days in advance of your appointment: [phone].   You can also use the MTM portal or MTM mobile app to manage your rides. Reimbursement for transportation is available through [facility]! For the portal, please go to [email or web address]  Call the [facility] Crisis Line at [phone], at any time, 24 hours a day, 7 days a week. If you are in danger or need immediate medical attention call 911.  If you would like help to quit smoking, call 1-800-QUIT-NOW (386-224-8028) OR Espaol: 1-855-Djelo-Ya (3-664-403-4742) o para ms informacin haga clic aqu or Text READY to 595-638 to register via text  Mr. Sigmon - following are the goals we discussed in your visit today:   Goals Addressed    Timeframe:  Long-Range Goal Priority:  High Start Date:   07/18/22                          Expected End Date:   ongoing                    Follow Up Date 09/26/22   - practice safe sex - schedule appointment for vaccines needed due to my age or health - schedule recommended health tests  - schedule and keep appointment for annual check-up     Why is this important?   Screening tests can find diseases early when they are easier to treat.  Your doctor or nurse will talk with you about which tests are important for you.  Getting shots for common diseases like the flu and shingles will help prevent them.  08/21/22:  patient up to date on all appts-to f/u on URO referral-has PCP appt 8/28 and [facility] appt 7/9   Patient verbalizes understanding of instructions and care plan provided today and agrees to view in MyChart. Active MyChart status and patient understanding of how to access instructions and care plan via MyChart confirmed with patient.     The Managed Medicaid care management team will reach out to the patient again over the next 45 business  days.  The  Patient  has been provided with contact information for the Managed Medicaid care management team and has been advised to call with any health related questions or concerns.   [name] RN, BSN [place]  [facility] Care Management Coordinator - Managed Medicaid High Risk [phone]   Following is a copy of your plan of care:  Care Plan : RN Care Manager Plan of Care  Updates made by [name], RN since 08/21/2022 12:00 AM     Problem: Health Promotion or Disease Self-Management (General Plan of Care)  Long-Range Goal: Chronic Disease Management and Care Coordination Needs   Start Date: 07/18/2022  Expected End Date: 10/18/2022  Priority: High  Note:   Current Barriers:  Knowledge Deficits related to plan of care for management of GERD, chronic LBP with right sciatica, osteoarthritis Care Coordination needs related to food and housing needs  Chronic Disease Management support and education needs related to GERD, chronic LBP with right sciatica, osteoarthritis  Financial Constraints  08/21/22:  No change in chronic pain-rates pain as a 7.5.  Followed closely by [facility], PCP, and ORTHO-to have back and knee  injections.  Patient to follow up on URO referral-urinary retention.  Patient in need of dental resources-will refer  RNCM Clinical Goal(s):  Patient will verbalize understanding of plan for management of GERD, chronic LBP with right sciatica, osteoarthritis  as evidenced by patient report verbalize basic understanding of GERD, chronic LBP with right sciatica, osteoarthritis  disease process and self health management plan as evidenced by patient report take all medications exactly as prescribed and will call provider for medication related questions as evidenced by patient report demonstrate understanding of rationale for each prescribed medication as evidenced by patient report attend all scheduled medical appointments as evidenced by patient report and EMR review demonstrate ongoing adherence to prescribed treatment plan for GERD, chronic LBP with right sciatica, osteo as evidenced by patient report and EMR review work with Child psychotherapist to address  related to the management of food and housing needs related to the management of GERD, chronic LBP with right sciatica, osteoarthritis  as evidenced by review of EMR and patient or Child psychotherapist report through collaboration with Medical illustrator, provider, and care team.   Interventions: Pain Interventions:  (Status:  New goal.) Long Term Goal Pain assessment performed Medications reviewed Reviewed provider established plan for pain management Discussed importance of adherence to all scheduled medical appointments Counseled on the importance of reporting any/all new or changed pain symptoms or management strategies to pain management provider Advised patient to report to care team affect of pain on daily activities Assessed social determinant of health barriers  Inter-disciplinary care team collaboration (see longitudinal plan of care) Evaluation of current treatment plan related to  self management and patient's adherence to plan as established by  provider  SDOH Barriers (Status:  New goal. and Goal on track:  Yes.) Long Term Goal Patient interviewed and SDOH assessment performed        SDOH Interventions   Patient interviewed and appropriate assessments performed Discussed plans with patient for ongoing care management follow up and provided patient with direct contact information for care management team Advised patient to contact provider regarding URO referral and back injections 07/18/22:  Patient has been provided with food and housing resources-5/21 by [facility] 08/21/22:  Collaborated with BSW for dental resources-referral placed  Patient Goals/Self-Care Activities: Take all medications as prescribed Attend all scheduled provider appointments Call pharmacy for medication refills 3-7 days in advance of running out of medications Perform all self care activities independently  Perform IADL's (shopping, preparing meals, housekeeping, managing finances) independently Call provider office for new concerns or questions  Work with the social worker to address care coordination needs and will continue to work with the clinical team to address health care and disease management related needs Patient interviewed and appropriate assessments performed Discussed plans with patient for ongoing care management follow up and provided patient with direct contact information for care management team Advised patient to contact provider regarding  URO referral and back injections  Follow Up Plan:  The patient has been provided with contact information for the care management team and has been advised to call with any health related questions or concerns.  The care management team will reach out to the patient again over the next 45 business  days.
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
## 2022-08-23 ENCOUNTER — Other Ambulatory Visit: Payer: Self-pay

## 2022-08-23 ENCOUNTER — Emergency Department (HOSPITAL_COMMUNITY)
Admission: EM | Admit: 2022-08-23 | Discharge: 2022-08-23 | Disposition: A | Discharge status: Home / Self Care | Payer: No Typology Code available for payment source | Attending: Emergency Medicine | Admitting: Emergency Medicine

## 2022-08-23 ENCOUNTER — Encounter (HOSPITAL_COMMUNITY): Payer: Self-pay

## 2022-08-23 DIAGNOSIS — G44019 Episodic cluster headache, not intractable: Secondary | ICD-10-CM | POA: Insufficient documentation

## 2022-08-23 DIAGNOSIS — R519 Headache, unspecified: Secondary | ICD-10-CM | POA: Diagnosis present

## 2022-08-23 MED ORDER — DIPHENHYDRAMINE HCL 50 MG/ML IJ SOLN
25.0000 mg | Freq: Once | INTRAMUSCULAR | Status: AC
  Administered 2022-08-23: 25 mg via INTRAVENOUS
  Filled 2022-08-23: qty 1

## 2022-08-23 MED ORDER — KETOROLAC TROMETHAMINE 15 MG/ML IJ SOLN
15.0000 mg | Freq: Once | INTRAMUSCULAR | Status: AC
  Administered 2022-08-23: 15 mg via INTRAVENOUS
  Filled 2022-08-23: qty 1

## 2022-08-23 MED ORDER — DEXAMETHASONE SODIUM PHOSPHATE 10 MG/ML IJ SOLN
10.0000 mg | Freq: Once | INTRAMUSCULAR | Status: AC
  Administered 2022-08-23: 10 mg via INTRAVENOUS
  Filled 2022-08-23: qty 1

## 2022-08-23 MED ORDER — SODIUM CHLORIDE 0.9 % IV BOLUS
1000.0000 mL | Freq: Once | INTRAVENOUS | Status: AC
  Administered 2022-08-23: 1000 mL via INTRAVENOUS

## 2022-08-23 MED ORDER — METOCLOPRAMIDE HCL 5 MG/ML IJ SOLN
10.0000 mg | Freq: Once | INTRAMUSCULAR | Status: AC
  Administered 2022-08-23: 10 mg via INTRAVENOUS
  Filled 2022-08-23: qty 2

## 2022-08-23 NOTE — ED Triage Notes (Signed)
Pt reports he is having a migraine attack. He reports it started 2 weeks ago. Rates his pain 10/10. Pt states the worst pain is behind his eyes. Has a hx of migraines. Tries to get over them, but sometimes has to come in and get medicine. Pt reports he is having some vision loss and it made him come in. Pt is also nauseous.

## 2022-08-23 NOTE — ED Provider Notes (Signed)
EMERGENCY DEPARTMENT AT [facility] Provider Note   CSN: [id number] Arrival date & time: 08/23/22  3329     History  Chief Complaint  Patient presents with   Headache    Bryan Valdez is a 57 y.o. male.  Patient with history of "cluster migraine" headaches presents for ongoing headache.  Patient reports that he has had attacks of headaches, occurring 3-4 times a day, occurring over the past 2 weeks.  He reports a pressure pain behind his left eye with associated transient vision loss and pain down into his neck.  He states that it has been several years since his most previous cluster.  He states that he has been evaluated in the past with CT scans and these have been negative.  Current symptoms are consistent with previous.  He denies any recent injuries or manipulations of his neck or shoulder area.  Currently his pain is minimal.  He had a worsening headache this morning, prompting emergency department visit.  Typically these will last 1 or 2 hours and then resolved.  He has tried over-the-counter medications without improvement.  With symptoms, his left eye will tear up and he gets a stuffy nose.  He states that he has not tried oxygen in the past.  No weakness, numbness, or tingling in the arms of the legs. Patient denies signs of stroke including: facial droop, slurred speech, aphasia, weakness/numbness in extremities, imbalance/trouble walking.  He states that typically when he gets these headaches, he will come to the emergency department for "migraine cocktail".  He states that this will help his symptoms for about a week and on occasion he has had to come in for repeat treatment and then the symptoms go away.       Home Medications Prior to Admission medications   Medication Sig Start Date End Date Taking? Authorizing Provider  celecoxib (CELEBREX) 200 MG capsule Take 1 capsule (200 mg total) by mouth 2 (two) times daily. 05/08/22   [name], MD   cyclobenzaprine (FLEXERIL) 10 MG tablet Take 1 tablet (10 mg total) by mouth 3 (three) times daily as needed for muscle spasms. 07/23/22   [name], MD  DULoxetine (CYMBALTA) 30 MG capsule TAKE 1 CAPSULE BY MOUTH ONCE A DAY FOR THE FIRST WEEK. THEN INCREASE TO 2 BY MOUTH ONCE DAILY. 05/23/22   [name], MD  famotidine-calcium carbonate-magnesium hydroxide (PEPCID COMPLETE) 10-800-165 MG chewable tablet Chew 1 tablet by mouth daily as needed. 05/22/22   [name], MD  gabapentin (NEURONTIN) 300 MG capsule Take 1 capsule (300 mg total) by mouth 3 (three) times daily. 04/30/22   [name], MD  omeprazole (PRILOSEC) 20 MG capsule Take 1 capsule (20 mg total) by mouth daily. 05/08/22   [name], MD  oxyCODONE-acetaminophen (PERCOCET) 10-325 MG tablet 1 tablet Orally every 8 hours, if needed for severe pain for 21 days 06/13/22   [provider]  simethicone (GAS-X) 80 MG chewable tablet Chew 1 tablet (80 mg total) by mouth every 6 (six) hours as needed for flatulence. 05/22/22   [name], MD  sulfamethoxazole-trimethoprim (BACTRIM DS) 800-160 MG tablet Take 1 tablet by mouth 2 (two) times daily. 05/22/22   [name], MD      Allergies    Naproxen    Review of Systems   Review of Systems  Physical Exam Updated Vital Signs BP 119/78 (BP Location: Right Arm)   Pulse (!) 52  Temp 97.7 F (36.5 C) (Oral)   Resp 18   Ht 6' (1.829 m)   Wt 86.2 kg   SpO2 98%   BMI 25.77 kg/m  Physical Exam Vitals and nursing note reviewed.  Constitutional:      Appearance: He is well-developed.  HENT:     Head: Normocephalic and atraumatic.     Right Ear: Tympanic membrane, ear canal and external ear normal.     Left Ear: Tympanic membrane, ear canal and external ear normal.     Nose: Nose normal.     Mouth/Throat:     Pharynx: Uvula midline.  Eyes:     General: Lids are normal.     Conjunctiva/sclera: Conjunctivae normal.     Pupils: Pupils are  equal, round, and reactive to light.  Neck:     Comments: No meningeal signs.  No carotid bruit.  No tenderness over the lateral neck. Cardiovascular:     Rate and Rhythm: Normal rate and regular rhythm.  Pulmonary:     Effort: Pulmonary effort is normal.     Breath sounds: Normal breath sounds.  Abdominal:     Palpations: Abdomen is soft.     Tenderness: There is no abdominal tenderness.  Musculoskeletal:        General: Normal range of motion.     Cervical back: Normal range of motion and neck supple. No tenderness or bony tenderness.  Skin:    General: Skin is warm and dry.  Neurological:     Mental Status: He is alert and oriented to person, place, and time.     GCS: GCS eye subscore is 4. GCS verbal subscore is 5. GCS motor subscore is 6.     Cranial Nerves: No cranial nerve deficit.     Sensory: No sensory deficit.     Motor: No abnormal muscle tone.     Coordination: Coordination normal.     Gait: Gait normal.     ED Results / Procedures / Treatments   Labs (all labs ordered are listed, but only abnormal results are displayed) Labs Reviewed - No data to display  EKG None  Radiology No results found.  Procedures Procedures    Medications Ordered in ED Medications  metoCLOPramide (REGLAN) injection 10 mg (10 mg Intravenous Given 08/23/22 0924)  dexamethasone (DECADRON) injection 10 mg (10 mg Intravenous Given 08/23/22 0923)  diphenhydrAMINE (BENADRYL) injection 25 mg (25 mg Intravenous Given 08/23/22 0925)  sodium chloride 0.9 % bolus 1,000 mL (0 mLs Intravenous Stopped 08/23/22 1024)  ketorolac (TORADOL) 15 MG/ML injection 15 mg (15 mg Intravenous Given 08/23/22 1052)    ED Course/ Medical Decision Making/ A&P    Patient seen and examined. History obtained directly from patient.  I reviewed previous head and imaging results in epic.  Labs/EKG: None ordered  Imaging: None ordered, low threshold for imaging if symptoms worsen  Medications/Fluids: Ordered:  Reglan, Benadryl, Decadron, fluid bolus.   Most recent vital signs reviewed and are as follows: BP 110/75   Pulse (!) 52   Temp 97.7 F (36.5 C) (Oral)   Resp 18   Ht 6' (1.829 m)   Wt 86.2 kg   SpO2 98%   BMI 25.77 kg/m   Initial impression: Cluster headache, symptoms at this point are consistent with previous episodes.  No neurologic deficits.  No risk factors for dissection and neck at this time.  Vision is normal.  10:36 AM Reassessment performed. Patient appears a little sleepy, but currently states that  his headache pain is currently 0.5 out of 10.  He is requesting Toradol as well as this has helped him in the past.  We did discuss bleeding risk versus benefit of medication.  His exam is unchanged and continues to be consistent with previous diagnosis of cluster headaches.  Offered Toradol after this discussion and patient would like this medication.  I have ordered 15 mg IV.  At this time I have low clinical suspicion for subarachnoid hemorrhage or other intracranial bleeding.  Most current vital signs reviewed and are as follows: BP 110/75   Pulse (!) 52   Temp 97.7 F (36.5 C) (Oral)   Resp 18   Ht 6' (1.829 m)   Wt 86.2 kg   SpO2 98%   BMI 25.77 kg/m   Plan: Discharge to home if continuing to do well after Toradol administration.  Prescriptions written for: None  Other home care instructions discussed: Avoidance of triggers, OTC meds  ED return instructions discussed: Patient counseled to return if they have weakness in their arms or legs, slurred speech, trouble walking or talking, confusion, trouble with their balance, or if they have any other concerns. Patient verbalizes understanding and agrees with plan.   Follow-up instructions discussed: Patient encouraged to follow-up with their PCP in 3 days.                               Medical Decision Making Risk Prescription drug management.   In regards to the patient's headache, critical differentials were  considered including subarachnoid hemorrhage, intracerebral hemorrhage, epidural/subdural hematoma, pituitary apoplexy, vertebral/carotid artery dissection, giant cell arteritis, central venous thrombosis, reversible cerebral vasoconstriction, acute angle closure glaucoma, idiopathic intracranial hypertension, bacterial meningitis, viral encephalitis, carbon monoxide poisoning, posterior reversible encephalopathy syndrome, pre-eclampsia.   Reg flag symptoms related to these causes were considered including systemic symptoms (fever, weight loss), neurologic symptoms (confusion, mental status change, vision change, associated seizure), acute or sudden "thunderclap" onset, patient age 63 or older with new or progressive headache, patient of any age with first headache or change in headache pattern, pregnant or postpartum status, history of HIV or other immunocompromise, history of cancer, headache occurring with exertion, associated neck or shoulder pain, associated traumatic injury, concurrent use of anticoagulation, family history of spontaneous SAH, and concurrent drug use.    Other benign, more common causes of headache were considered including migraine, tension-type headache, cluster headache, referred pain from other cause such as sinus infection, dental pain, trigeminal neuralgia.   On exam, patient has a reassuring neuro exam including baseline mental status, no significant neck pain or meningeal signs, no signs of severe infection or fever.   The patient's vital signs, pertinent lab work and imaging were reviewed and interpreted as discussed in the ED course. Hospitalization was considered for further testing, treatments, or serial exams/observation. However as patient is well-appearing, has a stable exam over the course of their evaluation, and reassuring studies today, I do not feel that they warrant admission at this time. This plan was discussed with the patient who verbalizes agreement and  comfort with this plan and seems reliable and able to return to the Emergency Department with worsening or changing symptoms.          Final Clinical Impression(s) / ED Diagnoses Final diagnoses:  Episodic cluster headache, not intractable    Rx / DC Orders ED Discharge Orders     None  Renne Crigler, PA-C 08/23/22 1338    Benjiman Core, MD 08/23/22 [phone]

## 2022-08-29 ENCOUNTER — Other Ambulatory Visit: Payer: Self-pay

## 2022-08-29 ENCOUNTER — Emergency Department (HOSPITAL_COMMUNITY)
Admission: EM | Admit: 2022-08-29 | Discharge: 2022-08-29 | Disposition: A | Discharge status: Home / Self Care | Payer: No Typology Code available for payment source | Attending: Emergency Medicine | Admitting: Emergency Medicine

## 2022-08-29 ENCOUNTER — Encounter (HOSPITAL_COMMUNITY): Payer: Self-pay | Admitting: *Deleted

## 2022-08-29 DIAGNOSIS — G43009 Migraine without aura, not intractable, without status migrainosus: Secondary | ICD-10-CM | POA: Diagnosis not present

## 2022-08-29 DIAGNOSIS — R519 Headache, unspecified: Secondary | ICD-10-CM | POA: Diagnosis present

## 2022-08-29 MED ORDER — SODIUM CHLORIDE 0.9 % IV BOLUS
1000.0000 mL | Freq: Once | INTRAVENOUS | Status: AC
  Administered 2022-08-29: 1000 mL via INTRAVENOUS

## 2022-08-29 MED ORDER — KETOROLAC TROMETHAMINE 15 MG/ML IJ SOLN
15.0000 mg | Freq: Once | INTRAMUSCULAR | Status: AC
  Administered 2022-08-29: 15 mg via INTRAVENOUS
  Filled 2022-08-29: qty 1

## 2022-08-29 MED ORDER — DEXAMETHASONE SODIUM PHOSPHATE 10 MG/ML IJ SOLN
10.0000 mg | Freq: Once | INTRAMUSCULAR | Status: AC
  Administered 2022-08-29: 10 mg via INTRAVENOUS
  Filled 2022-08-29: qty 1

## 2022-08-29 MED ORDER — DIPHENHYDRAMINE HCL 50 MG/ML IJ SOLN
25.0000 mg | Freq: Once | INTRAMUSCULAR | Status: AC
  Administered 2022-08-29: 25 mg via INTRAVENOUS
  Filled 2022-08-29: qty 1

## 2022-08-29 MED ORDER — METOCLOPRAMIDE HCL 5 MG/ML IJ SOLN
10.0000 mg | Freq: Once | INTRAMUSCULAR | Status: AC
  Administered 2022-08-29: 10 mg via INTRAVENOUS
  Filled 2022-08-29: qty 2

## 2022-08-29 NOTE — ED Triage Notes (Signed)
Pt here for migraine; ongoing for almost a week. Has been seen and treated with improvement of symptoms. Photophobia and nausea

## 2022-08-29 NOTE — ED Provider Notes (Signed)
[place] EMERGENCY DEPARTMENT AT [facility] Provider Note   CSN: [id number] Arrival date & time: 08/29/22  0104     History  Chief Complaint  Patient presents with   Headache    Bryan Valdez is a 57 y.o. male with a past medical history of migraines who presents emergency department with concerns for migraine x 1 week.  Notes he has been seen before for similar symptoms.  Has associated photophobia, nausea, blurry vision, phonophobia.  The symptoms are typical of his migraines.  Denies this feeling like the worst headache of his life or sudden onset headache.  Has tried over-the-counter BC powder.  Denies numbness, tingling, dizziness.  Patient denies have a neurologist at this time.  He does have a primary care provider.  The history is provided by the patient. No language interpreter was used.       Home Medications Prior to Admission medications   Medication Sig Start Date End Date Taking? Authorizing Provider  celecoxib (CELEBREX) 200 MG capsule Take 1 capsule (200 mg total) by mouth 2 (two) times daily. 05/08/22   [name], MD  cyclobenzaprine (FLEXERIL) 10 MG tablet Take 1 tablet (10 mg total) by mouth 3 (three) times daily as needed for muscle spasms. 07/23/22   [name], MD  DULoxetine (CYMBALTA) 30 MG capsule TAKE 1 CAPSULE BY MOUTH ONCE A DAY FOR THE FIRST WEEK. THEN INCREASE TO 2 BY MOUTH ONCE DAILY. 05/23/22   [name], MD  famotidine-calcium carbonate-magnesium hydroxide (PEPCID COMPLETE) 10-800-165 MG chewable tablet Chew 1 tablet by mouth daily as needed. 05/22/22   [name], MD  gabapentin (NEURONTIN) 300 MG capsule Take 1 capsule (300 mg total) by mouth 3 (three) times daily. 04/30/22   [name], MD  omeprazole (PRILOSEC) 20 MG capsule Take 1 capsule (20 mg total) by mouth daily. 05/08/22   [name], MD  oxyCODONE-acetaminophen (PERCOCET) 10-325 MG tablet 1 tablet Orally every 8 hours, if needed for severe pain for  21 days 06/13/22   [provider]  simethicone (GAS-X) 80 MG chewable tablet Chew 1 tablet (80 mg total) by mouth every 6 (six) hours as needed for flatulence. 05/22/22   [name], MD  sulfamethoxazole-trimethoprim (BACTRIM DS) 800-160 MG tablet Take 1 tablet by mouth 2 (two) times daily. 05/22/22   [name], MD      Allergies    Naproxen    Review of Systems   Review of Systems  Neurological:  Positive for headaches.  All other systems reviewed and are negative.   Physical Exam Updated Vital Signs BP (!) 142/91 (BP Location: Right Arm)   Pulse 70   Temp 98.5 F (36.9 C) (Oral)   Resp 18   SpO2 99%  Physical Exam Vitals and nursing note reviewed.  Constitutional:      General: He is not in acute distress.    Appearance: He is not diaphoretic.  HENT:     Head: Normocephalic and atraumatic.     Mouth/Throat:     Pharynx: No oropharyngeal exudate.  Eyes:     General: No scleral icterus.    Conjunctiva/sclera: Conjunctivae normal.  Cardiovascular:     Rate and Rhythm: Normal rate and regular rhythm.     Pulses: Normal pulses.     Heart sounds: Normal heart sounds.  Pulmonary:     Effort: Pulmonary effort is normal. No respiratory distress.     Breath sounds: Normal breath sounds.  No wheezing.  Abdominal:     General: Bowel sounds are normal.     Palpations: Abdomen is soft. There is no mass.     Tenderness: There is no abdominal tenderness. There is no guarding or rebound.  Musculoskeletal:        General: Normal range of motion.     Cervical back: Normal range of motion and neck supple.  Skin:    General: Skin is warm and dry.  Neurological:     Mental Status: He is alert.     Comments: No focal neurological deficits. Negative pronator drift. Able to ambulate without assistance or difficulty. Strength and sensation intact to BUE and BLE. Grip strength 5/5 bilaterally.  Normal finger-nose testing.  Normal heel-to-shin testing.  Cranial nerves II  through XII intact.  Psychiatric:        Behavior: Behavior normal.     ED Results / Procedures / Treatments   Labs (all labs ordered are listed, but only abnormal results are displayed) Labs Reviewed - No data to display  EKG None  Radiology No results found.  Procedures Procedures    Medications Ordered in ED Medications  sodium chloride 0.9 % bolus 1,000 mL (0 mLs Intravenous Stopped 08/29/22 0426)  dexamethasone (DECADRON) injection 10 mg (10 mg Intravenous Given 08/29/22 0242)  metoCLOPramide (REGLAN) injection 10 mg (10 mg Intravenous Given 08/29/22 0242)  diphenhydrAMINE (BENADRYL) injection 25 mg (25 mg Intravenous Given 08/29/22 0241)  ketorolac (TORADOL) 15 MG/ML injection 15 mg (15 mg Intravenous Given 08/29/22 0316)    ED Course/ Medical Decision Making/ A&P Clinical Course as of 08/29/22 0544  Thu Aug 29, 2022  0157 Offered CT head, patient declines at this time. [SB]  0256 Discussed with patient risk versus benefits of Toradol prior to a CT of the head.  Patient understanding of risk that if there is a potential brain bleed that Toradol could exacerbate this.  Patient wants to proceed with Toradol at this time. [SB]  0501 Patient reevaluated and noted improvement of headache from 10/11 now to 4/5. Discussed with patient discharge treatment plan. Discussed with patient recommendations for neurology referral. Answered all available questions. Pt appears safe for discharge at this time.  [SB]    Clinical Course User Index [SB] [place] A, PA-C                             Medical Decision Making Risk Prescription drug management.   Pt presented to the ED with gradual onset left sided migraine x 7 days.  Has a history of migraines and notes that this migraine feels similar.  Does not have a neurologist. Vital signs patient afebrile, not tachycardic or hypoxic. On exam, pt with no focal neurodeficits on exam.  No vision changes.  No red flags of neck pain, neck  stiffness, focal neuro deficits, or worst headache of life. Differential diagnosis includes SAH, ICH, migraine, tension headache.   Additional history obtained:  External records from outside source obtained and reviewed including: Patient was evaluated in the emergency department on 08/23/2022 for similar symptoms.  At that time was treated with a migraine cocktail.   Medications:  I ordered medication including IV fluids, Benadryl, Decadron, Reglan for symptom management Reevaluation of the patient after these medicines and interventions, I reevaluated the patient and found that they have improved I have reviewed the patients home medicines and have made adjustments as needed   Disposition: Presenting suspicious  for migraine headache. Presentation less likely due to [facility] or ICH due to the absence of red flags. After consideration of the diagnostic results and the patients response to treatment, I feel that the patient would benefit from Discharge home.  Patient provided with ambulatory referral to neurology regarding his history of migraines.  Offered work note, patient declined at this time.  Discussed with patient they may follow-up with their primary care provider as needed.  Supportive care measures and strict return precautions discussed with patient.  Patient knowledges and verbalized understanding.  Patient agreeable to discharge treatment plan. Patient appears safe for discharge at this time.  Follow-up as indicated in the discharge paperwork.   This chart was dictated using voice recognition software, Dragon. Despite the best efforts of this provider to proofread and correct errors, errors may still occur which can change documentation meaning.   Final Clinical Impression(s) / ED Diagnoses Final diagnoses:  Migraine without aura and without status migrainosus, not intractable    Rx / DC Orders ED Discharge Orders          Ordered    Ambulatory referral to Neurology        Comments: An appointment is requested in approximately: 2 weeks   08/29/22 0541              Lauramae Kneisley A, PA-C 08/29/22 0544    Sabas Sous, MD 08/29/22 [phone]

## 2022-08-29 NOTE — Discharge Instructions (Addendum)
It was a pleasure taking care of you today!   It is important that you follow up with your primary care provider regarding todays ED visit. You will be provided with an ambulatory referral to a Neurologist for follow up regarding todays ED visit and your symptoms. Ensure to maintain fluid intake. You may take over the counter 500 mg tylenol every 6 hours and alternate with over the counter 600 mg ibuprofen every 6 hours as needed for your symptoms for no more than 7 days. Return to the Emergency Department if you are experiencing increasing/worsening symptoms.

## 2022-08-30 ENCOUNTER — Telehealth: Payer: Self-pay | Admitting: Internal Medicine

## 2022-08-30 NOTE — Telephone Encounter (Addendum)
Patient states he went to ED on 7/4 due to migraine. States he has migraines when he has sinus infection. Pt is requesting pcp send in a medication taper for his sinus infection. I offered pt an OV and he has been scheduled for 7/12 (next opening) for ED follow up. Pt stated he wanted med sent in today. He wants a callback from PCP. Please Advise.

## 2022-09-03 NOTE — Telephone Encounter (Signed)
Pt returned call and has been scheduled with Dulce Sellar for 7/10 @ 10:40 am.

## 2022-09-03 NOTE — Telephone Encounter (Signed)
Called to get pt scheduled with another provider but received no answer. VM full.

## 2022-09-04 ENCOUNTER — Telehealth: Payer: Self-pay

## 2022-09-04 ENCOUNTER — Ambulatory Visit (INDEPENDENT_AMBULATORY_CARE_PROVIDER_SITE_OTHER): Payer: No Typology Code available for payment source | Admitting: Family

## 2022-09-04 VITALS — BP 116/73 | HR 60 | Temp 97.1°F | Ht 72.0 in | Wt 188.0 lb

## 2022-09-04 DIAGNOSIS — G44009 Cluster headache syndrome, unspecified, not intractable: Secondary | ICD-10-CM | POA: Diagnosis not present

## 2022-09-04 MED ORDER — PREDNISONE 10 MG PO TABS
ORAL_TABLET | ORAL | 0 refills | Status: DC
Start: 2022-09-04 — End: 2023-02-14

## 2022-09-04 NOTE — Telephone Encounter (Signed)
-----   Message from Dulce Sellar, NP sent at 09/04/2022 11:15 AM EDT ----- Regarding: visit today Call [facility] - forgot to tell him to NOT take his Celebrex while taking the prednisone - could cause stomach pain or ulcer.  Thx!
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
## 2022-09-04 NOTE — Telephone Encounter (Signed)
I called pt and pt gave a verbalized understanding.  

## 2022-09-06 ENCOUNTER — Ambulatory Visit: Payer: No Typology Code available for payment source | Admitting: Internal Medicine

## 2022-09-10 ENCOUNTER — Ambulatory Visit: Payer: No Typology Code available for payment source | Admitting: Neurology

## 2022-09-12 ENCOUNTER — Ambulatory Visit: Payer: No Typology Code available for payment source | Admitting: Neurology

## 2022-09-16 ENCOUNTER — Ambulatory Visit (INDEPENDENT_AMBULATORY_CARE_PROVIDER_SITE_OTHER): Payer: Medicaid Other | Admitting: Internal Medicine

## 2022-09-16 ENCOUNTER — Encounter: Payer: Self-pay | Admitting: Internal Medicine

## 2022-09-16 VITALS — BP 110/70 | HR 59 | Temp 98.1°F | Ht 72.0 in | Wt 189.8 lb

## 2022-09-16 DIAGNOSIS — G43909 Migraine, unspecified, not intractable, without status migrainosus: Secondary | ICD-10-CM

## 2022-09-16 DIAGNOSIS — R110 Nausea: Secondary | ICD-10-CM

## 2022-09-16 DIAGNOSIS — G44019 Episodic cluster headache, not intractable: Secondary | ICD-10-CM

## 2022-09-16 DIAGNOSIS — J320 Chronic maxillary sinusitis: Secondary | ICD-10-CM

## 2022-09-16 DIAGNOSIS — G43811 Other migraine, intractable, with status migrainosus: Secondary | ICD-10-CM

## 2022-09-16 DIAGNOSIS — K2100 Gastro-esophageal reflux disease with esophagitis, without bleeding: Secondary | ICD-10-CM

## 2022-09-16 HISTORY — DX: Migraine, unspecified, not intractable, without status migrainosus: G43.909

## 2022-09-16 HISTORY — DX: Episodic cluster headache, not intractable: G44.019

## 2022-09-16 MED ORDER — AMOXICILLIN-POT CLAVULANATE 875-125 MG PO TABS
1.0000 | ORAL_TABLET | Freq: Two times a day (BID) | ORAL | 0 refills | Status: DC
Start: 2022-09-16 — End: 2023-02-14

## 2022-09-16 MED ORDER — FAMOTIDINE 20 MG PO TABS
20.0000 mg | ORAL_TABLET | Freq: Two times a day (BID) | ORAL | 3 refills | Status: DC
Start: 2022-09-16 — End: 2023-04-10

## 2022-09-16 MED ORDER — ONDANSETRON HCL 4 MG PO TABS: 4.0000 mg | ORAL_TABLET | Freq: Three times a day (TID) | ORAL | 0 refills | Status: DC | PRN

## 2022-09-16 MED ORDER — PSEUDOEPHEDRINE HCL ER 120 MG PO TB12
120.0000 mg | ORAL_TABLET | Freq: Two times a day (BID) | ORAL | 0 refills | Status: DC
Start: 2022-09-16 — End: 2023-02-14

## 2022-09-16 MED ORDER — SIMPLY SALINE 0.9 % NA AERS: 2.0000 | INHALATION_SPRAY | NASAL | 11 refills | Status: DC

## 2022-09-16 MED ORDER — TOPIRAMATE 25 MG PO TABS
25.0000 mg | ORAL_TABLET | Freq: Two times a day (BID) | ORAL | 1 refills | Status: DC
Start: 2022-09-16 — End: 2022-10-08

## 2022-09-16 MED ORDER — FLUTICASONE PROPIONATE 50 MCG/ACT NA SUSP
2.0000 | Freq: Every day | NASAL | 6 refills | Status: DC
Start: 2022-09-16 — End: 2023-09-01

## 2022-09-16 MED ORDER — LORATADINE 10 MG PO TABS
10.0000 mg | ORAL_TABLET | Freq: Every day | ORAL | 11 refills | Status: DC
Start: 2022-09-16 — End: 2023-02-14

## 2022-09-16 MED ORDER — SUMATRIPTAN SUCCINATE 50 MG PO TABS
50.0000 mg | ORAL_TABLET | Freq: Every day | ORAL | 5 refills | Status: AC | PRN
Start: 2022-09-16 — End: ?

## 2022-09-16 NOTE — Assessment & Plan Note (Signed)
Cluster Migraines: They suffer from frequent migraines accompanied by transient vision loss, photophobia, phonophobia, and osmophobia, with a history of ER visits for acute management. We will start Sumatriptan 100mg  as needed for acute migraines, limiting to 10 tablets per month. Topiramate will be started for migraine prophylaxis and may offer a potential weight loss benefit. Ondansetron will be started as needed for nausea associated with migraines. They will continue their current regimen of Ibuprofen and Goody's powder as needed. High flow oxygen will be considered for acute relief.

## 2022-09-16 NOTE — Patient Instructions (Addendum)
It was a pleasure seeing you today! Your health and satisfaction are our top priorities.  Glenetta Hew, MD  Your Providers PCP: [name], MD,  [phone]) Referring Provider: [name], MD,  [phone]) Care Team Provider: London Sheer, MD,  [phone]) Care Team Provider: Madelyn Brunner, [place],  [phone]) Care Team Provider: [place] Crane,  [phone]) Care Team Provider: [name], RN   VISIT SUMMARY:  During your visit, we discussed your ongoing issues with chronic sinusitis, frequent migraines, and acid reflux. We suspect that your sinusitis, which may be due to exposure to mold or mildew, could be triggering your migraines. Your current treatments for these conditions have not been effective, so we are planning to try new approaches. We also discussed your acid reflux, which has been causing discomfort despite your use of over-the-counter antacids.  YOUR PLAN:  -CHRONIC SINUSITIS: This is a condition where your sinuses become inflamed and swollen for at least 12 weeks despite treatment attempts. We will start you on Simply Saline sinus rinses 3-5 times daily until there is improvement, then continue once daily for maintenance. If there is no improvement, we may refer you to an Ear, Nose, and Throat (ENT) specialist for possible fungal sinusitis.  -CLUSTER MIGRAINES: These are severe headaches that occur in clusters or cyclical patterns. We will start you on Sumatriptan for acute migraines, Topiramate for migraine prevention, and Ondansetron for nausea associated with migraines. You will continue your current regimen of Ibuprofen and Goody's powder as needed. We may consider high flow oxygen for acute relief.  -GASTROESOPHAGEAL REFLUX DISEASE (GERD): This is a chronic condition where stomach acid frequently flows back into the tube connecting your mouth and stomach (esophagus). This backwash (acid reflux) can irritate the lining of your  esophagus. I added famotidine after you left to help give more options for treatment.  INSTRUCTIONS:  A follow-up appointment is scheduled for late August to assess your response to the new treatments. If your sinus symptoms persist, we will discuss the potential for a referral to an ENT specialist.  NEXT STEPS: [x]  Early Intervention: Schedule sooner appointment, call our on-call services, or go to emergency room if there is any significant Increase in pain or discomfort New or worsening symptoms Sudden or severe changes in your health [x]  Flexible Follow-Up: We recommend a Return in about 6 weeks (around 10/28/2022) for close follow up acute illness. for optimal routine care. This allows for progress monitoring and treatment adjustments. [x]  Preventive Care: Schedule your annual preventive care visit! It's typically covered by insurance and helps identify potential health issues early. [x]  Lab & X-ray Appointments: Incomplete tests scheduled today, or call to schedule. X-rays: [facility] (M-F, 8:30am-noon or 1pm-5pm). [x]  Medical Information Release: Sign a release form at front desk to obtain relevant medical information we don't have.  MAKING THE MOST OF OUR FOCUSED 20 MINUTE APPOINTMENTS: [x]   Clearly state your top concerns at the beginning of the visit to focus our discussion [x]   If you anticipate you will need more time, please inform the front desk during scheduling - we can book multiple appointments in the same week. [x]   If you have transportation problems- use our convenient video appointments or ask about transportation support. [x]   We can get down to business faster if you use MyChart to update information before the visit and submit non-urgent questions before your visit. Thank you for taking the time to provide details through MyChart.  Let our  nurse know and she can import this information into your encounter documents.  Arrival and Wait Times: [x]   Arriving  on time ensures that everyone receives prompt attention. [x]   Early morning (8a) and afternoon (1p) appointments tend to have shortest wait times. [x]   Unfortunately, we cannot delay appointments for late arrivals or hold slots during phone calls.  Getting Answers and Following Up [x]   Simple Questions & Concerns: For quick questions or basic follow-up after your visit, reach [place] at [phone] or MyChart messaging. [x]   Complex Concerns: If your concern is more complex, scheduling an appointment might be best. Discuss this with the staff to find the most suitable option. [x]   Lab & Imaging Results: We'll contact you directly if results are abnormal or you don't use MyChart. Most normal results will be on MyChart within 2-3 business days, with a review message from Dr. Jon Billings. Haven't heard back in 2 weeks? Need results sooner? Contact us at [phone]. [x]   Referrals: Our referral coordinator will manage specialist referrals. The specialist's office should contact you within 2 weeks to schedule an appointment. Call us if you haven't heard from them after 2 weeks.  Staying Connected [x]   MyChart: Activate your MyChart for the fastest way to access results and message [place]. See the last page of this paperwork for instructions on how to activate.  Bring to Your Next Appointment [x]   Medications: Please bring all your medication bottles to your next appointment to ensure we have an accurate record of your prescriptions. [x]   Health Diaries: If you're monitoring any health conditions at home, keeping a diary of your readings can be very helpful for discussions at your next appointment.  Billing [x]   X-ray & Lab Orders: These are billed by separate companies. Contact the invoicing company directly for questions or concerns. [x]   Visit Charges: Discuss any billing inquiries with our administrative services team.  Your Satisfaction Matters [x]   Share Your Experience: We strive for your  satisfaction! If you have any complaints, or preferably compliments, please let Dr. Jon Billings know directly or contact our Practice Administrators, Edwena Felty or [employer], by asking at the front desk.   Reviewing Your Records [x]   Review this early draft of your clinical encounter notes below and the final encounter summary tomorrow on MyChart after its been completed.  All orders placed so far are visible here: Episodic cluster headache, not intractable Assessment & Plan:  He has yet to try oxygen treatment(s) so I strongly encouraged patient to try above.   Also important to aggressively decongest   Chronic maxillary sinusitis -     Amoxicillin-Pot Clavulanate; Take 1 tablet by mouth 2 (two) times daily.  Dispense: 20 tablet; Refill: 0 -     Fluticasone Propionate; Place 2 sprays into both nostrils daily.  Dispense: 16 g; Refill: 6 -     Simply Saline; Place 2 each into the nose as directed. Use nightly for sinus hygiene long-term.  Can also be used as many times daily as desired to assist with clearing congested sinuses.  Dispense: 127 mL; Refill: 11 -     Loratadine; Take 1 tablet (10 mg total) by mouth daily.  Dispense: 30 tablet; Refill: 11 -     Pseudoephedrine HCl ER; Take 1 tablet (120 mg total) by mouth 2 (two) times daily.  Dispense: 20 tablet; Refill: 0  Nausea -     Ondansetron HCl; Take 1 tablet (4 mg total) by mouth every 8 (eight) hours as  needed for nausea or vomiting.  Dispense: 40 tablet; Refill: 0  Other migraine with status migrainosus, intractable Assessment & Plan: Cluster Migraines: They suffer from frequent migraines accompanied by transient vision loss, photophobia, phonophobia, and osmophobia, with a history of ER visits for acute management. We will start Sumatriptan 100mg  as needed for acute migraines, limiting to 10 tablets per month. Topiramate will be started for migraine prophylaxis and may offer a potential weight loss benefit. Ondansetron will be  started as needed for nausea associated with migraines. They will continue their current regimen of Ibuprofen and Goody's powder as needed. High flow oxygen will be considered for acute relief.  Orders: -     SUMAtriptan Succinate; Take 1 tablet (50 mg total) by mouth daily as needed for migraine. May repeat in 2 hours if headache persists or recurs.  Dispense: 10 tablet; Refill: 5 -     Topiramate; Take 1 tablet (25 mg total) by mouth 2 (two) times daily.  Dispense: 60 tablet; Refill: 1  Gastroesophageal reflux disease with esophagitis without hemorrhage -     Famotidine; Take 1 tablet (20 mg total) by mouth 2 (two) times daily.  Dispense: 180 tablet; Refill: 3
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
## 2022-09-16 NOTE — Assessment & Plan Note (Signed)
  He has yet to try oxygen treatment(s) so I strongly encouraged patient to try above.   Also important to aggressively decongest

## 2022-09-23 NOTE — Addendum Note (Signed)
Addended by: Donzetta Starch on: 09/23/2022 11:34 AM   Modules accepted: Orders

## 2022-09-24 ENCOUNTER — Other Ambulatory Visit: Payer: No Typology Code available for payment source

## 2022-09-24 NOTE — Patient Instructions (Signed)
Visit Information  Bryan Valdez was given information about Medicaid Managed Care team care coordination services as a part of their [facility] Medicaid benefit. Bryan Valdez verbally consented to engagement with the [facility] Managed Care team.   If you are experiencing a medical emergency, please call 911 or report to your local emergency department or urgent care.   If you have a non-emergency medical problem during routine business hours, please contact your provider's office and ask to speak with a nurse.   For questions related to your [facility] health plan, please call: [phone] or go here:[email or web address]  If you would like to schedule transportation through your [facility] plan, please call the following number at least 2 days in advance of your appointment: [phone].   You can also use the MTM portal or MTM mobile app to manage your rides. Reimbursement for transportation is available through [facility]! For the portal, please go to [email or web address]  Call the [facility] Crisis Line at [phone], at any time, 24 hours a day, 7 days a week. If you are in danger or need immediate medical attention call 911.  If you would like help to quit smoking, call 1-800-QUIT-NOW ((272)433-1353) OR Espaol: 1-855-Djelo-Ya (4-034-742-5956) o para ms informacin haga clic aqu or Text READY to 387-564 to register via text  [name] - following are the goals we discussed in your visit today:   Goals Addressed   None       The  Patient                                              has been provided with contact information for the Managed Medicaid care management team and has been advised to call with any health related questions or concerns.   Bryan Valdez, Bryan Valdez, MHA [facility]  Managed Medicaid Social Worker [phone]   Following is a copy of your plan of care:  There are no care plans that you recently modified to display for this  patient.
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
## 2022-09-25 ENCOUNTER — Encounter (INDEPENDENT_AMBULATORY_CARE_PROVIDER_SITE_OTHER): Payer: Self-pay

## 2022-09-26 ENCOUNTER — Other Ambulatory Visit: Payer: No Typology Code available for payment source | Admitting: Obstetrics and Gynecology

## 2022-09-26 ENCOUNTER — Encounter: Payer: Self-pay | Admitting: Obstetrics and Gynecology
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
## 2022-09-26 NOTE — Patient Instructions (Signed)
Hey Bryan Valdez for speaking with me-have a nice afternoon!  Bryan Valdez was given information about Medicaid Managed Care team care coordination services as a part of their [facility] Medicaid benefit. Bryan Valdez verbally consented to engagement with [facility] Managed Care team.   If you are experiencing a medical emergency, please call 911 or report to your local emergency department or urgent care.   If you have a non-emergency medical problem during routine business hours, please contact your provider's office and ask to speak with a nurse.   For questions related to your [facility] health plan, please call: [phone] or go here:[email or web address]  If you would like to schedule transportation through your [facility] plan, please call Bryan following number at least 2 days in advance of your appointment: [phone].   You can also use Bryan MTM portal or MTM mobile app to manage your rides. Reimbursement for transportation is available through [facility]! For Bryan portal, please go to [email or web address]  Call Bryan [facility] Crisis Line at [phone], at any time, 24 hours a day, 7 days a week. If you are in danger or need immediate medical attention call 911.  If you would like help to quit smoking, call 1-800-QUIT-NOW (2158887175) OR Espaol: 1-855-Djelo-Ya (3-474-259-5638) o para ms informacin haga clic aqu or Text READY to 756-433 to register via text  Bryan Valdez - following are Bryan goals we discussed in your visit today:   Goals Addressed   Timeframe:  Long-Range Goal Priority:  High Start Date:   07/18/22                          Expected End Date:   ongoing                    Follow Up Date 10/30/22   - practice safe sex - schedule appointment for vaccines needed due to my age or health - schedule recommended health tests  - schedule and keep appointment for annual check-up    Why is this important?   Screening tests can find  diseases early when they are easier to treat.  Your doctor or nurse will talk with you about which tests are important for you.  Getting shots for common diseases like Bryan flu and shingles will help prevent them.  09/26/22:  PCP this month, NEURO this month  Valdez verbalizes understanding of instructions and care plan provided today and agrees to view in MyChart. Active MyChart status and Valdez understanding of how to access instructions and care plan via MyChart confirmed with Valdez.     Bryan Managed Medicaid care management team will reach out to Bryan Valdez again over Bryan next 30 business  days.  Bryan  Valdez has been provided with contact information for Bryan Managed Medicaid care management team and has been advised to call with any health related questions or concerns.   [name] RN, BSN [place]  [facility] Care Management Coordinator - Managed Medicaid High Risk [phone]   Following is a copy of your plan of care:  Care Plan : RN Care Manager Plan of Care  Updates made by [name], RN since 09/26/2022 12:00 AM     Problem: Health Promotion or Disease Self-Management (General Plan of Care)      Long-Range Goal: Chronic Disease Management and Care Coordination Needs   Start Date: 07/18/2022  Expected End Date: 12/27/2022  Priority: High  Note:  Current Barriers:  Knowledge Deficits related to plan of care for management of GERD, chronic LBP with right sciatica, osteoarthritis Care Coordination needs related to food and housing needs  Chronic Disease Management support and education needs related to GERD, chronic LBP with right sciatica, osteoarthritis  Financial Constraints  09/26/22:  Valdez states he has not heard anything regarding urology referral-will f/u.  Needs dental resources.  Seen in ED twice since last encounter with Valdez for headaches-NEURO referral and appt scheduled.    RNCM Clinical Goal(s):  Valdez will verbalize  understanding of plan for management of GERD, chronic LBP with right sciatica, osteoarthritis  as evidenced by Valdez report verbalize basic understanding of GERD, chronic LBP with right sciatica, osteoarthritis  disease process and self health management plan as evidenced by Valdez report take all medications exactly as prescribed and will call provider for medication related questions as evidenced by Valdez report demonstrate understanding of rationale for each prescribed medication as evidenced by Valdez report attend all scheduled medical appointments as evidenced by Valdez report and EMR review demonstrate ongoing adherence to prescribed treatment plan for GERD, chronic LBP with right sciatica, osteo as evidenced by Valdez report and EMR review work with Child psychotherapist to address  related to Bryan management of food and housing needs related to Bryan management of GERD, chronic LBP with right sciatica, osteoarthritis  as evidenced by review of EMR and Valdez or Child psychotherapist report through collaboration with Medical illustrator, provider, and care team.   Interventions: Pain Interventions:  (Status:  New goal.) Long Term Goal Pain assessment performed Medications reviewed Reviewed provider established plan for pain management Discussed importance of adherence to all scheduled medical appointments Counseled on Bryan importance of reporting any/all new or changed pain symptoms or management strategies to pain management provider Advised Valdez to report to care team affect of pain on daily activities Assessed social determinant of health barriers  Inter-disciplinary care team collaboration (see longitudinal plan of care) Evaluation of current treatment plan related to  self management and Valdez's adherence to plan as established by provider 09/26/22: Collaborated with PCP regarding URO referral 09/26/22: Message sent to PCP regarding URO referral.  SDOH Barriers (Status:  New goal. and Goal on  track:  Yes.) Long Term Goal Valdez interviewed and SDOH assessment performed        SDOH Interventions    Valdez interviewed and appropriate assessments performed Discussed plans with Valdez for ongoing care management follow up and provided Valdez with direct contact information for care management team Advised Valdez to contact provider regarding URO referral and back injections 07/18/22:  Valdez has been provided with food and housing resources-5/21 by [facility] 08/21/22:  Collaborated with BSW for dental resources-referral placed  Valdez Goals/Self-Care Activities: Take all medications as prescribed Attend all scheduled provider appointments Call pharmacy for medication refills 3-7 days in advance of running out of medications Perform all self care activities independently  Perform IADL's (shopping, preparing meals, housekeeping, managing finances) independently Call provider office for new concerns or questions  Work with Bryan social worker to address care coordination needs and will continue to work with Bryan clinical team to address health care and disease management related needs Valdez interviewed and appropriate assessments performed Discussed plans with Valdez for ongoing care management follow up and provided Valdez with direct contact information for care management team Advised Valdez to contact provider regarding URO referral and back injections  Follow Up Plan:  Bryan Valdez has been provided with contact information for  Bryan care management team and has been advised to call with any health related questions or concerns.  Bryan care management team will reach out to Bryan Valdez again over Bryan next 45 business  days.

## 2022-09-29 ENCOUNTER — Other Ambulatory Visit: Payer: Self-pay | Admitting: Internal Medicine

## 2022-09-29 DIAGNOSIS — R3129 Other microscopic hematuria: Secondary | ICD-10-CM

## 2022-09-29 DIAGNOSIS — N5082 Scrotal pain: Secondary | ICD-10-CM

## 2022-09-29 NOTE — Progress Notes (Signed)
Was contacted and advised urology has not contacted patient yet. Placing new urgent urology referral now Arranged system process evaluation, called and apologized for delay and assured patient we would make it right with new fresh referral expediently placed- expect to be called soon.

## 2022-10-01 ENCOUNTER — Encounter: Payer: Self-pay | Admitting: Diagnostic Neuroimaging

## 2022-10-01 ENCOUNTER — Ambulatory Visit: Payer: Medicaid Other | Admitting: Diagnostic Neuroimaging

## 2022-10-02 ENCOUNTER — Other Ambulatory Visit: Payer: Medicaid Other
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
## 2022-10-02 NOTE — Patient Instructions (Signed)
Visit Information  Bryan Valdez was given information about Medicaid Managed Care team care coordination services as a part of their [facility] Medicaid benefit. Bryan Valdez verbally consented to engagement with the [facility] Managed Care team.   If you are experiencing a medical emergency, please call 911 or report to your local emergency department or urgent care.   If you have a non-emergency medical problem during routine business hours, please contact your provider's office and ask to speak with a nurse.   For questions related to your [facility] health plan, please call: [phone] or go here:[email or web address]  If you would like to schedule transportation through your [facility] plan, please call the following number at least 2 days in advance of your appointment: [phone].   You can also use the MTM portal or MTM mobile app to manage your rides. Reimbursement for transportation is available through [facility]! For the portal, please go to [email or web address]  Call the [facility] Crisis Line at [phone], at any time, 24 hours a day, 7 days a week. If you are in danger or need immediate medical attention call 911.  If you would like help to quit smoking, call 1-800-QUIT-NOW ((272)433-1353) OR Espaol: 1-855-Djelo-Ya (4-034-742-5956) o para ms informacin haga clic aqu or Text READY to 387-564 to register via text  Bryan Valdez - following are the goals we discussed in your visit today:   Goals Addressed   None       The  Patient                                              has been provided with contact information for the Managed Medicaid care management team and has been advised to call with any health related questions or concerns.   Bryan Valdez, Bryan Valdez, MHA [facility]  Managed Medicaid Social Worker [phone]   Following is a copy of your plan of care:  There are no care plans that you recently modified to display for this  patient.

## 2022-10-03 ENCOUNTER — Other Ambulatory Visit: Payer: Self-pay | Admitting: Internal Medicine

## 2022-10-03 DIAGNOSIS — G8929 Other chronic pain: Secondary | ICD-10-CM

## 2022-10-08 ENCOUNTER — Other Ambulatory Visit: Payer: Self-pay | Admitting: Internal Medicine

## 2022-10-08 DIAGNOSIS — G43811 Other migraine, intractable, with status migrainosus: Secondary | ICD-10-CM

## 2022-10-14 NOTE — Addendum Note (Signed)
Addended by: Donzetta Starch on: 10/14/2022 02:23 PM   Modules accepted: Orders

## 2022-10-23 ENCOUNTER — Ambulatory Visit: Payer: Medicaid Other | Admitting: Internal Medicine

## 2022-10-24 ENCOUNTER — Ambulatory Visit: Payer: Medicaid Other | Admitting: Internal Medicine

## 2022-10-30 ENCOUNTER — Ambulatory Visit: Payer: Medicaid Other | Admitting: Diagnostic Neuroimaging

## 2022-10-30 ENCOUNTER — Encounter: Payer: Self-pay | Admitting: Diagnostic Neuroimaging

## 2022-10-30 ENCOUNTER — Other Ambulatory Visit: Payer: Medicaid Other | Admitting: Obstetrics and Gynecology

## 2022-10-30 ENCOUNTER — Encounter: Payer: Self-pay | Admitting: Obstetrics and Gynecology
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
## 2022-10-30 NOTE — Patient Instructions (Signed)
Hi Mr. Kruchten, thank you for updating me this afternoon-have a nice rest of the day!  Mr. Noyola was given information about Medicaid Managed Care team care coordination services as a part of their [facility] Medicaid benefit. Medgar Bechen verbally consented to engagement with the [facility] Managed Care team.   If you are experiencing a medical emergency, please call 911 or report to your local emergency department or urgent care.   If you have a non-emergency medical problem during routine business hours, please contact your provider's office and ask to speak with a nurse.   For questions related to your [facility] health plan, please call: [phone] or go here:[email or web address]  If you would like to schedule transportation through your [facility] plan, please call the following number at least 2 days in advance of your appointment: [phone].   You can also use the MTM portal or MTM mobile app to manage your rides. Reimbursement for transportation is available through [facility]! For the portal, please go to [email or web address]  Call the [facility] Crisis Line at [phone], at any time, 24 hours a day, 7 days a week. If you are in danger or need immediate medical attention call 911.  If you would like help to quit smoking, call 1-800-QUIT-NOW (3515946840) OR Espaol: 1-855-Djelo-Ya (4-132-440-1027) o para ms informacin haga clic aqu or Text READY to 253-664 to register via text  Mr. Bulls - following are the goals we discussed in your visit today:   Goals Addressed    Timeframe:  Long-Range Goal Priority:  High Start Date:   07/18/22                          Expected End Date:   ongoing                    Follow Up Date 11/29/22   - practice safe sex - schedule appointment for vaccines needed due to my age or health - schedule recommended health tests  - schedule and keep appointment for annual check-up    Why is this important?   Screening  tests can find diseases early when they are easier to treat.  Your doctor or nurse will talk with you about which tests are important for you.  Getting shots for common diseases like the flu and shingles will help prevent them.  10/30/22: Seen by URO and [facility] in August-missed NEURO appt this morning due to being out of town.  Has Cologard kit.  Patient verbalizes understanding of instructions and care plan provided today and agrees to view in MyChart. Active MyChart status and patient understanding of how to access instructions and care plan via MyChart confirmed with patient.     The Managed Medicaid care management team will reach out to the patient again over the next 30 business  days.  The  Patient  has been provided with contact information for the Managed Medicaid care management team and has been advised to call with any health related questions or concerns.   [name] RN, BSN [place]  [facility] Care Management Coordinator - Managed Medicaid High Risk [phone]   Following is a copy of your plan of care:  Care Plan : RN Care Manager Plan of Care  Updates made by [name], RN since 10/30/2022 12:00 AM     Problem: Health Promotion or Disease Self-Management (General Plan of Care)      Long-Range  Goal: Chronic Disease Management and Care Coordination Needs   Start Date: 07/18/2022  Expected End Date: 12/27/2022  Priority: High  Note:   Current Barriers:  Knowledge Deficits related to plan of care for management of GERD, chronic LBP with right sciatica, osteoarthritis Care Coordination needs related to food and housing needs  Chronic Disease Management support and education needs related to GERD, chronic LBP with right sciatica, osteoarthritis  Financial Constraints  10/30/22:  No complaints today-Mother died 8/1-declines LCSW referral and/or grief counseling.  Seen and evaluated by URO 8/22. [facility] 8/27.  Missed NEURO appt  this morning due to being out of town-states he is not having headaches right now.  States has not received dental resources yet.  Still waiting to hear about disability.  RNCM Clinical Goal(s):  Patient will verbalize understanding of plan for management of GERD, chronic LBP with right sciatica, osteoarthritis  as evidenced by patient report verbalize basic understanding of GERD, chronic LBP with right sciatica, osteoarthritis  disease process and self health management plan as evidenced by patient report take all medications exactly as prescribed and will call provider for medication related questions as evidenced by patient report demonstrate understanding of rationale for each prescribed medication as evidenced by patient report attend all scheduled medical appointments as evidenced by patient report and EMR review demonstrate ongoing adherence to prescribed treatment plan for GERD, chronic LBP with right sciatica, osteo as evidenced by patient report and EMR review work with Child psychotherapist to address  related to the management of food and housing needs related to the management of GERD, chronic LBP with right sciatica, osteoarthritis  as evidenced by review of EMR and patient or Child psychotherapist report through collaboration with Medical illustrator, provider, and care team.   Interventions: Pain Interventions:  (Status:  New goal.) Long Term Goal Pain assessment performed Medications reviewed Reviewed provider established plan for pain management Discussed importance of adherence to all scheduled medical appointments Counseled on the importance of reporting any/all new or changed pain symptoms or management strategies to pain management provider Advised patient to report to care team affect of pain on daily activities Assessed social determinant of health barriers  Inter-disciplinary care team collaboration (see longitudinal plan of care) Evaluation of current treatment plan related to  self  management and patient's adherence to plan as established by provider  SDOH Barriers (Status:  New goal. and Goal on track:  Yes.) Long Term Goal Patient interviewed and SDOH assessment performed        SDOH Interventions    Patient interviewed and appropriate assessments performed Discussed plans with patient for ongoing care management follow up and provided patient with direct contact information for care management team Advised patient to contact provider regarding URO referral and back injections 07/18/22:  Patient has been provided with food and housing resources-5/21 by [facility] 08/21/22:  Collaborated with BSW for dental resources-referral placed  Patient Goals/Self-Care Activities: Take all medications as prescribed Attend all scheduled provider appointments Call pharmacy for medication refills 3-7 days in advance of running out of medications Perform all self care activities independently  Perform IADL's (shopping, preparing meals, housekeeping, managing finances) independently Call provider office for new concerns or questions  Work with the social worker to address care coordination needs and will continue to work with the clinical team to address health care and disease management related needs Patient interviewed and appropriate assessments performed Discussed plans with patient for ongoing care management follow up and provided patient  with direct contact information for care management team Advised patient to contact provider regarding URO referral and back injections  Follow Up Plan:  The patient has been provided with contact information for the care management team and has been advised to call with any health related questions or concerns.  The care management team will reach out to the patient again over the next 45 business  days.

## 2022-11-05 ENCOUNTER — Other Ambulatory Visit: Payer: Medicaid Other
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
## 2022-11-05 NOTE — Patient Instructions (Signed)
Visit Information  [name] was given information about Medicaid Managed Care team care coordination services as a part of their [facility] Medicaid benefit. Bryan Valdez verbally consented to engagement with the [facility] Managed Care team.   If you are experiencing a medical emergency, please call 911 or report to your local emergency department or urgent care.   If you have a non-emergency medical problem during routine business hours, please contact your provider's office and ask to speak with a nurse.   For questions related to your [facility] health plan, please call: [phone] or go here:[email or web address]  If you would like to schedule transportation through your [facility] Health plan, please call the following number at least 2 days in advance of your appointment: [phone].   You can also use the MTM portal or MTM mobile app to manage your rides. Reimbursement for transportation is available through [facility]! For the portal, please go to [email or web address]  Call the [facility] Crisis Line at [phone], at any time, 24 hours a day, 7 days a week. If you are in danger or need immediate medical attention call 911.  If you would like help to quit smoking, call 1-800-QUIT-NOW (445-153-0504) OR Espaol: 1-855-Djelo-Ya (8-841-660-6301) o para ms informacin haga clic aqu or Text READY to 601-093 to register via text  Mr. Kralik - following are the goals we discussed in your visit today:   Goals Addressed   None      Social Worker will follow up on 12/13/22.   Bryan Valdez, Bryan Valdez, MHA [facility] Health  Managed Medicaid Social Worker [phone]   Following is a copy of your plan of care:  There are no care plans that you recently modified to display for this patient.

## 2022-11-22 DIAGNOSIS — R3121 Asymptomatic microscopic hematuria: Secondary | ICD-10-CM | POA: Insufficient documentation

## 2022-11-28 ENCOUNTER — Encounter: Payer: Self-pay | Admitting: Internal Medicine

## 2022-11-28 DIAGNOSIS — K769 Liver disease, unspecified: Secondary | ICD-10-CM | POA: Insufficient documentation

## 2022-11-28 DIAGNOSIS — Z8042 Family history of malignant neoplasm of prostate: Secondary | ICD-10-CM | POA: Insufficient documentation

## 2022-11-28 DIAGNOSIS — Z8739 Personal history of other diseases of the musculoskeletal system and connective tissue: Secondary | ICD-10-CM | POA: Insufficient documentation

## 2022-11-29 ENCOUNTER — Other Ambulatory Visit: Payer: Medicaid Other | Admitting: Obstetrics and Gynecology

## 2022-11-29 NOTE — Patient Instructions (Signed)
Hi Mr. Gappa, thank you for all of the updates this morning-have a great day and weekend!  Mr. Rosete was given information about Medicaid Managed Care team care coordination services as a part of their [facility] Medicaid benefit. Alvar Malinoski verbally consented to engagement with the [facility] Managed Care team.   If you are experiencing a medical emergency, please call 911 or report to your local emergency department or urgent care.   If you have a non-emergency medical problem during routine business hours, please contact your provider's office and ask to speak with a nurse.   For questions related to your [facility] health plan, please call: [phone] or go here:[email or web address]  If you would like to schedule transportation through your [facility] plan, please call the following number at least 2 days in advance of your appointment: [phone].   You can also use the MTM portal or MTM mobile app to manage your rides. Reimbursement for transportation is available through [facility]! For the portal, please go to [email or web address]  Call the [facility] Crisis Line at [phone], at any time, 24 hours a day, 7 days a week. If you are in danger or need immediate medical attention call 911.  If you would like help to quit smoking, call 1-800-QUIT-NOW ((930)771-3569) OR Espaol: 1-855-Djelo-Ya (2-725-366-4403) o para ms informacin haga clic aqu or Text READY to 474-259 to register via text  Mr. Mayorga - following are the goals we discussed in your visit today:   Goals Addressed    Timeframe:  Long-Range Goal Priority:  High Start Date:   07/18/22                          Expected End Date:   ongoing                    Follow Up Date 01/13/23   - practice safe sex - schedule appointment for vaccines needed due to my age or health - schedule recommended health tests  - schedule and keep appointment for annual check-up    Why is this important?    Screening tests can find diseases early when they are easier to treat.  Your doctor or nurse will talk with you about which tests are important for you.  Getting shots for common diseases like the flu and shingles will help prevent them.  11/29/22: Patient with upcoming ORTHO, URO appts  Patient verbalizes understanding of instructions and care plan provided today and agrees to view in MyChart. Active MyChart status and patient understanding of how to access instructions and care plan via MyChart confirmed with patient.     The Managed Medicaid care management team will reach out to the patient again over the next 45 business  days.  The  Patient has been provided with contact information for the Managed Medicaid care management team and has been advised to call with any health related questions or concerns.   [name] RN, BSN [place]  [facility] Care Management Coordinator - Managed Medicaid High Risk [phone]   Following is a copy of your plan of care:  Care Plan : RN Care Manager Plan of Care  Updates made by [name], RN since 11/29/2022 12:00 AM     Problem: Health Promotion or Disease Self-Management (General Plan of Care)      Long-Range Goal: Chronic Disease Management and Care Coordination Needs   Start Date: 07/18/2022  Expected End Date: 03/01/2023  Priority: High  Note:   Current Barriers:  Knowledge Deficits related to plan of care for management of GERD, chronic LBP with right sciatica, osteoarthritis Care Coordination needs related to food and housing needs  Chronic Disease Management support and education needs related to GERD, chronic LBP with right sciatica, osteoarthritis  Financial Constraints  11/29/22:  Expects to hear about disability 10/29.  Dental resources received-has not selected anyone yet.  To have cystoscopy this month.  Pain comes and goes-injections really not helpful-has  ORTHO appt and [facility] app[facility] this  month.  RNCM Clinical Goal(s):  Patient will verbalize understanding of plan for management of GERD, chronic LBP with right sciatica, osteoarthritis  as evidenced by patient report verbalize basic understanding of GERD, chronic LBP with right sciatica, osteoarthritis  disease process and self health management plan as evidenced by patient report take all medications exactly as prescribed and will call provider for medication related questions as evidenced by patient report demonstrate understanding of rationale for each prescribed medication as evidenced by patient report attend all scheduled medical appointments as evidenced by patient report and EMR review demonstrate ongoing adherence to prescribed treatment plan for GERD, chronic LBP with right sciatica, osteo as evidenced by patient report and EMR review work with Child psychotherapist to address  related to the management of food and housing needs related to the management of GERD, chronic LBP with right sciatica, osteoarthritis  as evidenced by review of EMR and patient or Child psychotherapist report through collaboration with Medical illustrator, provider, and care team.   Interventions: Pain Interventions:  (Status:  New goal.) Long Term Goal Pain assessment performed Medications reviewed Reviewed provider established plan for pain management Discussed importance of adherence to all scheduled medical appointments Counseled on the importance of reporting any/all new or changed pain symptoms or management strategies to pain management provider Advised patient to report to care team affect of pain on daily activities Assessed social determinant of health barriers  Inter-disciplinary care team collaboration (see longitudinal plan of care) Evaluation of current treatment plan related to  self management and patient's adherence to plan as established by provider  SDOH Barriers (Status:  New goal. and Goal on track:  Yes.) Long Term Goal Patient interviewed  and SDOH assessment performed        SDOH Interventions    Patient interviewed and appropriate assessments performed Discussed plans with patient for ongoing care management follow up and provided patient with direct contact information for care management team Advised patient to contact provider regarding URO referral and back injections 07/18/22:  Patient has been provided with food and housing resources-5/21 by [facility] 08/21/22:  Collaborated with BSW for dental resources-referral placed-completed  Patient Goals/Self-Care Activities: Take all medications as prescribed Attend all scheduled provider appointments Call pharmacy for medication refills 3-7 days in advance of running out of medications Perform all self care activities independently  Perform IADL's (shopping, preparing meals, housekeeping, managing finances) independently Call provider office for new concerns or questions  Work with the social worker to address care coordination needs and will continue to work with the clinical team to address health care and disease management related needs Patient interviewed and appropriate assessments performed Discussed plans with patient for ongoing care management follow up and provided patient with direct contact information for care management team Advised patient to contact provider regarding URO referral and back injections  Follow Up Plan:  The patient has been provided with contact information for the care management  team and has been advised to call with any health related questions or concerns.  The care management team will reach out to the patient again over the next 45 business  days.
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
## 2022-12-05 ENCOUNTER — Ambulatory Visit: Payer: Medicaid Other | Admitting: Physical Therapy

## 2022-12-13 ENCOUNTER — Other Ambulatory Visit: Payer: Self-pay

## 2022-12-13 NOTE — Patient Instructions (Signed)
Visit Information  Bryan Valdez was given information about Medicaid Managed Care team care coordination services as a part of their [facility] Medicaid benefit. Bryan Valdez verbally consented to engagement with the [facility] Managed Care team.   If you are experiencing a medical emergency, please call 911 or report to your local emergency department or urgent care.   If you have a non-emergency medical problem during routine business hours, please contact your provider's office and ask to speak with a nurse.   For questions related to your [facility] health plan, please call: [phone] or go here:[email or web address]  If you would like to schedule transportation through your [facility] plan, please call the following number at least 2 days in advance of your appointment: [phone].   You can also use the MTM portal or MTM mobile app to manage your rides. Reimbursement for transportation is available through [facility]! For the portal, please go to [email or web address]  Call the [facility] Crisis Line at [phone], at any time, 24 hours a day, 7 days a week. If you are in danger or need immediate medical attention call 911.  If you would like help to quit smoking, call 1-800-QUIT-NOW (501-612-4069) OR Espaol: 1-855-Djelo-Ya (4-132-440-1027) o para ms informacin haga clic aqu or Text READY to 253-664 to register via text  Mr. Krist - following are the goals we discussed in your visit today:   Goals Addressed   None      Social Worker will follow up in 30-45 days.   Bryan Valdez, Bryan Valdez, MHA [facility]  Managed Medicaid Social Worker [phone]   Following is a copy of your plan of care:  There are no care plans that you recently modified to display for this patient.
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
## 2022-12-19 ENCOUNTER — Other Ambulatory Visit: Payer: Self-pay | Admitting: Anesthesiology

## 2022-12-19 ENCOUNTER — Encounter: Payer: Self-pay | Admitting: Anesthesiology

## 2022-12-19 ENCOUNTER — Ambulatory Visit: Payer: Medicaid Other | Attending: Anesthesiology | Admitting: Physical Therapy

## 2022-12-19 DIAGNOSIS — M5412 Radiculopathy, cervical region: Secondary | ICD-10-CM

## 2022-12-26 ENCOUNTER — Encounter: Payer: Self-pay | Admitting: Internal Medicine

## 2022-12-27 ENCOUNTER — Ambulatory Visit
Admission: RE | Admit: 2022-12-27 | Discharge: 2022-12-27 | Disposition: A | Discharge status: Home / Self Care | Payer: Medicaid Other | Source: Ambulatory Visit | Attending: Anesthesiology | Admitting: Anesthesiology

## 2022-12-27 DIAGNOSIS — M5412 Radiculopathy, cervical region: Secondary | ICD-10-CM

## 2022-12-31 NOTE — Telephone Encounter (Signed)
Having Hasna to override and reschedule patient from his 11/13 OV to tomorrow (11/6) for an video visit for migraines.

## 2023-01-01 ENCOUNTER — Telehealth: Payer: Medicaid Other | Admitting: Internal Medicine

## 2023-01-01 ENCOUNTER — Encounter: Payer: Self-pay | Admitting: Internal Medicine

## 2023-01-01 DIAGNOSIS — M5441 Lumbago with sciatica, right side: Secondary | ICD-10-CM | POA: Diagnosis not present

## 2023-01-01 DIAGNOSIS — G8929 Other chronic pain: Secondary | ICD-10-CM | POA: Diagnosis not present

## 2023-01-01 DIAGNOSIS — G43811 Other migraine, intractable, with status migrainosus: Secondary | ICD-10-CM | POA: Diagnosis not present

## 2023-01-01 DIAGNOSIS — R110 Nausea: Secondary | ICD-10-CM

## 2023-01-01 MED ORDER — PREDNISONE 20 MG PO TABS
ORAL_TABLET | ORAL | 0 refills | Status: DC
Start: 2023-01-01 — End: 2023-02-14

## 2023-01-01 MED ORDER — METOCLOPRAMIDE HCL 5 MG PO TABS
5.0000 mg | ORAL_TABLET | Freq: Four times a day (QID) | ORAL | 0 refills | Status: DC
Start: 2023-01-01 — End: 2023-02-14

## 2023-01-01 MED ORDER — TOPIRAMATE 25 MG PO TABS: 25.0000 mg | ORAL_TABLET | Freq: Two times a day (BID) | ORAL | 1 refills | Status: DC

## 2023-01-01 MED ORDER — ONDANSETRON 4 MG PO TBDP
4.0000 mg | ORAL_TABLET | Freq: Three times a day (TID) | ORAL | 2 refills | Status: DC | PRN
Start: 2023-01-01 — End: 2023-02-14

## 2023-01-01 MED ORDER — NURTEC 75 MG PO TBDP
1.0000 | ORAL_TABLET | Freq: Every day | ORAL | 2 refills | Status: AC | PRN
Start: 2023-01-01 — End: ?

## 2023-01-01 MED ORDER — DIPHENHYDRAMINE HCL 25 MG PO CAPS
25.0000 mg | ORAL_CAPSULE | Freq: Four times a day (QID) | ORAL | 0 refills | Status: DC | PRN
Start: 2023-01-01 — End: 2023-02-14

## 2023-01-01 NOTE — Patient Instructions (Signed)
VISIT SUMMARY:  During today's visit, we discussed your ongoing severe migraine episodes, which have been particularly intense over the past three weeks. We reviewed your history of migraines, previous treatments, and current medications. We also touched on your urinary symptoms and allergies.  YOUR PLAN:  -MIGRAINE: A migraine is a type of headache characterized by severe pain, often on one side of the head, and can include symptoms like sensitivity to light, sound, and smells, as well as nausea and vomiting. We will start you on a five-drug cocktail that includes topiramate 25mg  daily for long-term prevention, Nurtec to replace Imitrex, Benadryl, Reglan, and a prednisone taper. Please take the cocktail all at once and rest afterwards. If the headache persists, schedule a follow-up appointment. We will also urgently refer you to a new pain management specialist and follow up on your recent MRI of the neck.  -URINARY SYMPTOMS: We discussed that your current medication to help empty your bladder might be contributing to your severe headaches. You should consider stopping it.   INSTRUCTIONS:  Please take the prescribed five-drug cocktail as directed and rest afterwards. If your headache persists, schedule a follow-up appointment. We will urgently refer you to a new pain management specialist and follow up on your recent MRI of the neck.

## 2023-01-01 NOTE — Assessment & Plan Note (Signed)
He has been experiencing severe, daily migraines for the past three weeks, which originate from the right neck and radiate to the right eye. Symptoms include photophobia, phonophobia, osmophobia, and nausea with vomiting. He has a history of migraines since the 1980s, with a period of remission until the beginning of this year. Previous treatments in 2017, including a headache cocktail and topiramate, were successful. We will prescribe a five-drug cocktail that includes topiramate 25mg  daily for long-term prevention, Nurtec to replace Imitrex due to side effects, Benadryl, Reglan, and a prednisone taper. He is advised to take the cocktail all at once and rest afterwards. If the headache persists after taking the cocktail, he should schedule a follow-up appointment. An urgent referral to a new pain management specialist is necessary due to issues with the previous clinic. We will also follow up on the recent MRI of the neck.
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
## 2023-01-01 NOTE — Assessment & Plan Note (Signed)
He needs new referral- reports discharged from prior clinic over verbal misunderstanding at prior.

## 2023-01-02 ENCOUNTER — Telehealth: Payer: Self-pay | Admitting: Internal Medicine

## 2023-01-02 DIAGNOSIS — G43111 Migraine with aura, intractable, with status migrainosus: Secondary | ICD-10-CM

## 2023-01-02 NOTE — Telephone Encounter (Signed)
Pt states pharmacy let him know he needs a PA for  Rimegepant Sulfate (NURTEC) 75 MG TBDP  Please advise.

## 2023-01-03 ENCOUNTER — Telehealth: Payer: Self-pay

## 2023-01-03 ENCOUNTER — Other Ambulatory Visit (HOSPITAL_COMMUNITY): Payer: Self-pay

## 2023-01-03 NOTE — Telephone Encounter (Signed)
Pharmacy Patient Advocate Encounter   Received notification from Pt Calls Messages that prior authorization for Nurtec 75mg  is required/requested.   Insurance verification completed.   The patient is insured through [facility] [place] .   Per test claim: PA required; PA submitted to above mentioned insurance via CoverMyMeds Key/confirmation #/EOC [id number] Status is pending

## 2023-01-06 NOTE — Telephone Encounter (Signed)
Pharmacy Patient Advocate Encounter  Received notification from [facility] Medicaid that Prior Authorization for Nurtec 75mg  tabs has been DENIED.  Full denial letter will be uploaded to the media tab. See denial reason below.   PA #/Case ID/Reference #: [id number]      Please be advised we currently do not have a Pharmacist to review denials, therefore you will need to process appeals accordingly as needed. Thanks for your support at this time. Contact for appeals are as follows: Phone: [phone], Fax: [phone]

## 2023-01-08 ENCOUNTER — Ambulatory Visit: Payer: Medicaid Other | Admitting: Internal Medicine

## 2023-01-08 MED ORDER — AIMOVIG 70 MG/ML ~~LOC~~ SOAJ
70.0000 mg | SUBCUTANEOUS | 11 refills | Status: DC
Start: 2023-01-08 — End: 2023-06-26

## 2023-01-08 MED ORDER — ERENUMAB-AOOE 70 MG/ML ~~LOC~~ SOAJ
70.0000 mg | Freq: Once | SUBCUTANEOUS | Status: DC
Start: 2023-01-08 — End: 2023-01-08

## 2023-01-08 NOTE — Telephone Encounter (Signed)
Sent my chart message informing patient of this denial and the reason. Patient has to try and fail two preferred calcitonin gene-related peptides before they will approve Nurtec.

## 2023-01-09 ENCOUNTER — Other Ambulatory Visit: Payer: Self-pay | Admitting: Internal Medicine

## 2023-01-09 DIAGNOSIS — G8929 Other chronic pain: Secondary | ICD-10-CM

## 2023-01-09 NOTE — Telephone Encounter (Signed)
Informed patient of this denial. Dr. Jon Billings has sent in Aimovig instead, which needs a PA also (sent request to PA team).

## 2023-01-10 ENCOUNTER — Ambulatory Visit (INDEPENDENT_AMBULATORY_CARE_PROVIDER_SITE_OTHER): Payer: Medicaid Other | Admitting: Internal Medicine

## 2023-01-10 ENCOUNTER — Encounter: Payer: Self-pay | Admitting: Internal Medicine

## 2023-01-10 VITALS — BP 124/71 | HR 60 | Temp 98.5°F | Ht 72.0 in | Wt 195.2 lb

## 2023-01-10 DIAGNOSIS — G43811 Other migraine, intractable, with status migrainosus: Secondary | ICD-10-CM

## 2023-01-10 DIAGNOSIS — G8929 Other chronic pain: Secondary | ICD-10-CM | POA: Diagnosis not present

## 2023-01-10 DIAGNOSIS — M170 Bilateral primary osteoarthritis of knee: Secondary | ICD-10-CM

## 2023-01-10 DIAGNOSIS — M5412 Radiculopathy, cervical region: Secondary | ICD-10-CM | POA: Insufficient documentation

## 2023-01-10 DIAGNOSIS — M4802 Spinal stenosis, cervical region: Secondary | ICD-10-CM | POA: Insufficient documentation

## 2023-01-10 DIAGNOSIS — Z599 Problem related to housing and economic circumstances, unspecified: Secondary | ICD-10-CM

## 2023-01-10 DIAGNOSIS — M5441 Lumbago with sciatica, right side: Secondary | ICD-10-CM

## 2023-01-10 DIAGNOSIS — M549 Dorsalgia, unspecified: Secondary | ICD-10-CM

## 2023-01-10 MED ORDER — METHYLPREDNISOLONE ACETATE 40 MG/ML IJ SUSP
40.0000 mg | Freq: Once | INTRAMUSCULAR | Status: AC
Start: 2023-01-10 — End: 2023-01-10
  Administered 2023-01-10: 40 mg via INTRAMUSCULAR

## 2023-01-10 MED ORDER — OXYCODONE-ACETAMINOPHEN 10-325 MG PO TABS
1.0000 | ORAL_TABLET | Freq: Three times a day (TID) | ORAL | 0 refills | Status: DC | PRN
Start: 2023-01-10 — End: 2023-01-15

## 2023-01-10 MED ORDER — METHYLPREDNISOLONE SODIUM SUCC 40 MG IJ SOLR
40.0000 mg | Freq: Once | INTRAMUSCULAR | Status: DC
Start: 2023-01-10 — End: 2023-01-10

## 2023-01-10 MED ORDER — KETOROLAC TROMETHAMINE 60 MG/2ML IM SOLN
60.0000 mg | Freq: Once | INTRAMUSCULAR | Status: AC
Start: 2023-01-10 — End: 2023-01-10
  Administered 2023-01-10: 60 mg via INTRAMUSCULAR

## 2023-01-10 MED ORDER — TOPIRAMATE 25 MG PO TABS: 25.0000 mg | ORAL_TABLET | Freq: Three times a day (TID) | ORAL | 1 refills | Status: DC

## 2023-01-10 NOTE — Telephone Encounter (Signed)
Prior authorization is needed for Aimovig also. I have sent a request to PA team.
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
## 2023-01-11 NOTE — Assessment & Plan Note (Signed)
Upper extremity numbness correlating with neck pain Consistent with imaging findings Plan: Monitor symptoms Address through comprehensive pain management approach

## 2023-01-11 NOTE — Assessment & Plan Note (Signed)
Severe disc degeneration with mild spinal cord compression at C3-C4 MRI confirms compression from both anterior and posterior elements Contributing to chronic neck pain, headaches, and upper extremity radiculopathy Plan: Neurosurgery referral for surgical evaluation Referral to physical therapy pending neurosurgical recommendations Disability support documentation provided Patient educated about risk of neurological complications with trauma

## 2023-01-11 NOTE — Assessment & Plan Note (Signed)
Following with social work

## 2023-01-11 NOTE — Patient Instructions (Signed)
AFTER VISIT SUMMARY  Date of Visit: January 11, 2023  Key Findings: Your MRI shows disc degeneration in your neck with mild pressure on your spinal cord at the C3-C4 level. This explains your neck pain, headaches, and arm numbness. While the compression is mild, it requires careful management to prevent worsening.  Your Treatment Plan:  1. Medications:    - Percocet for pain (5-day supply)    - Topiramate: Increase to three times daily for headache prevention    - Aimovig: New medication for migraine prevention (pending insurance approval)  2. Follow-Up Appointments:    - Return in 5 days for pain medication review    - Neurosurgery consultation (being scheduled)    - New pain management specialist referral submitted  3. Activity Guidelines:    - Avoid activities that could strain your neck    - Use caution with any physical activity    - Stop any activity that increases pain or numbness  Important Information: - Your letter for disability/child support has been provided - Take medications exactly as prescribed - Keep all follow-up appointments  Seek Immediate Medical Attention if You Experience: - Sudden increase in neck pain - New or worsening numbness/weakness - Difficulty with balance or walking - Severe headache different from your usual pattern  Next Appointment: In 5 days for medication review

## 2023-01-11 NOTE — Assessment & Plan Note (Signed)
Complex presentation with cervicogenic features Failed previous treatment attempts Pattern suggests neurological and vascular components Plan: Increase topiramate to three times daily Initiate Aimovig (insurance approval pending) Current prophylaxis: topiramate Acute treatment: Percocet (temporary)

## 2023-01-11 NOTE — Assessment & Plan Note (Signed)
Pain significantly impacting functional status Previous pain management interrupted since September Current presentation warrants reinitiation of pain management Plan: Prescribe Percocet 10-325mg  for 5 days (bridge therapy) New pain management referral submitted Follow-up in 5 days for medication reassessment Patient educated on risks/benefits of chronic opioid therapy

## 2023-01-13 ENCOUNTER — Ambulatory Visit: Payer: Self-pay | Admitting: Obstetrics and Gynecology

## 2023-01-14 ENCOUNTER — Other Ambulatory Visit: Payer: Self-pay | Admitting: Obstetrics and Gynecology
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
## 2023-01-14 NOTE — Patient Instructions (Signed)
Hi Mr. Ogaz you for the updates this morning, remember to follow up with Dr. Jon Billings if you do not hear anything about the referrals he placed.  Have a nice day!  Mr. Dellis was given information about Medicaid Managed Care team care coordination services as a part of their [facility] Medicaid benefit. Roma Khosla verbally consented to engagement with the [facility] Managed Care team.   If you are experiencing a medical emergency, please call 911 or report to your local emergency department or urgent care.   If you have a non-emergency medical problem during routine business hours, please contact your provider's office and ask to speak with a nurse.   For questions related to your [facility] health plan, please call: [phone] or go here:[email or web address]  If you would like to schedule transportation through your [facility] plan, please call the following number at least 2 days in advance of your appointment: [phone].   You can also use the MTM portal or MTM mobile app to manage your rides. Reimbursement for transportation is available through [facility]! For the portal, please go to [email or web address]  Call the [facility] Crisis Line at [phone], at any time, 24 hours a day, 7 days a week. If you are in danger or need immediate medical attention call 911.  If you would like help to quit smoking, call 1-800-QUIT-NOW ((709)703-1262) OR Espaol: 1-855-Djelo-Ya (3-295-188-4166) o para ms informacin haga clic aqu or Text READY to 063-016 to register via text  Mr. Aase - following are the goals we discussed in your visit today:   Goals Addressed    Timeframe:  Long-Range Goal Priority:  High Start Date:   07/18/22                          Expected End Date:   ongoing                    Follow Up Date 02/13/23   - practice safe sex - schedule appointment for vaccines needed due to my age or health - schedule recommended health tests  -  schedule and keep appointment for annual check-up    Why is this important?   Screening tests can find diseases early when they are easier to treat.  Your doctor or nurse will talk with you about which tests are important for you.  Getting shots for common diseases like the flu and shingles will help prevent them.  01/14/23:  ORTHO appt tomorrow  Patient verbalizes understanding of instructions and care plan provided today and agrees to view in MyChart. Active MyChart status and patient understanding of how to access instructions and care plan via MyChart confirmed with patient.     The Managed Medicaid care management team will reach out to the patient again over the next 30 business days.  The  Patient  has been provided with contact information for the Managed Medicaid care management team and has been advised to call with any health related questions or concerns.   [name] RN, BSN [place]  [facility] Care Management Coordinator - Managed Medicaid High Risk [phone]   Following is a copy of your plan of care:  Care Plan : RN Care Manager Plan of Care  Updates made by [name], RN since 01/14/2023 12:00 AM     Problem: Health Promotion or Disease Self-Management (General Plan of Care)      Long-Range Goal: Chronic Disease Management and  Care Coordination Needs   Start Date: 07/18/2022  Expected End Date: 03/01/2023  Priority: High  Note:   Current Barriers:  Knowledge Deficits related to plan of care for management of GERD, chronic LBP with right sciatica, osteoarthritis Care Coordination needs related to food and housing needs  Chronic Disease Management support and education needs related to GERD, chronic LBP with right sciatica, osteoarthritis  Financial Constraints  01/14/23:  Patient having neck and back pain as well as headaches-Pain clinic referral and Neurosurgery referral placed by PCP-has PCP f/u tomorrow.  Did not have cystoscopy.  No  disability updates.  RNCM Clinical Goal(s):  Patient will verbalize understanding of plan for management of GERD, chronic LBP with right sciatica, osteoarthritis  as evidenced by patient report verbalize basic understanding of GERD, chronic LBP with right sciatica, osteoarthritis  disease process and self health management plan as evidenced by patient report take all medications exactly as prescribed and will call provider for medication related questions as evidenced by patient report demonstrate understanding of rationale for each prescribed medication as evidenced by patient report attend all scheduled medical appointments as evidenced by patient report and EMR review demonstrate ongoing adherence to prescribed treatment plan for GERD, chronic LBP with right sciatica, osteo as evidenced by patient report and EMR review work with Child psychotherapist to address  related to the management of food and housing needs related to the management of GERD, chronic LBP with right sciatica, osteoarthritis  as evidenced by review of EMR and patient or Child psychotherapist report through collaboration with Medical illustrator, provider, and care team.   Interventions: Pain Interventions:  (Status:  New goal.) Long Term Goal Pain assessment performed Medications reviewed Reviewed provider established plan for pain management Discussed importance of adherence to all scheduled medical appointments Counseled on the importance of reporting any/all new or changed pain symptoms or management strategies to pain management provider Advised patient to report to care team affect of pain on daily activities Assessed social determinant of health barriers  Inter-disciplinary care team collaboration (see longitudinal plan of care) Evaluation of current treatment plan related to  self management and patient's adherence to plan as established by provider  SDOH Barriers (Status:  New goal. and Goal on track:  Yes.) Long Term Goal Patient  interviewed and SDOH assessment performed        SDOH Interventions    Patient interviewed and appropriate assessments performed Discussed plans with patient for ongoing care management follow up and provided patient with direct contact information for care management team Advised patient to contact provider regarding URO referral and back injections 07/18/22:  Patient has been provided with food and housing resources-5/21 by [facility] 08/21/22:  Collaborated with BSW for dental resources-referral placed-completed  Patient Goals/Self-Care Activities: Take all medications as prescribed Attend all scheduled provider appointments Call pharmacy for medication refills 3-7 days in advance of running out of medications Perform all self care activities independently  Perform IADL's (shopping, preparing meals, housekeeping, managing finances) independently Call provider office for new concerns or questions  Work with the social worker to address care coordination needs and will continue to work with the clinical team to address health care and disease management related needs Patient interviewed and appropriate assessments performed Discussed plans with patient for ongoing care management follow up and provided patient with direct contact information for care management team Advised patient to contact provider regarding URO referral and back injections  Follow Up Plan:  The patient has been provided with contact  information for the care management team and has been advised to call with any health related questions or concerns.  The care management team will reach out to the patient again over the next 45 business  days.

## 2023-01-15 ENCOUNTER — Ambulatory Visit (INDEPENDENT_AMBULATORY_CARE_PROVIDER_SITE_OTHER): Payer: Medicaid Other | Admitting: Internal Medicine

## 2023-01-15 ENCOUNTER — Encounter: Payer: Self-pay | Admitting: Internal Medicine

## 2023-01-15 VITALS — BP 128/82 | HR 63 | Temp 98.4°F | Ht 72.0 in | Wt 193.6 lb

## 2023-01-15 DIAGNOSIS — M48062 Spinal stenosis, lumbar region with neurogenic claudication: Secondary | ICD-10-CM

## 2023-01-15 DIAGNOSIS — M549 Dorsalgia, unspecified: Secondary | ICD-10-CM

## 2023-01-15 DIAGNOSIS — R151 Fecal smearing: Secondary | ICD-10-CM

## 2023-01-15 DIAGNOSIS — G8929 Other chronic pain: Secondary | ICD-10-CM | POA: Diagnosis not present

## 2023-01-15 DIAGNOSIS — M4802 Spinal stenosis, cervical region: Secondary | ICD-10-CM | POA: Diagnosis not present

## 2023-01-15 MED ORDER — OXYCODONE-ACETAMINOPHEN 10-325 MG PO TABS
1.0000 | ORAL_TABLET | Freq: Three times a day (TID) | ORAL | 0 refills | Status: DC | PRN
Start: 2023-01-15 — End: 2023-02-14

## 2023-01-15 NOTE — Patient Instructions (Addendum)
Team Member Role and Specialty Contact Info Address Start End Comments  London Sheer, MD Consulting Physician (Orthopedic Surgery) Phone: [phone] Fax: [phone] [address] [place] [address] 04/30/2022 - possible hip injections   Call today for as soon as possible appointment for worsening fecal incontinence.   VISIT SUMMARY:  During today's visit, we discussed your chronic back pain and new onset of fecal incontinence. We reviewed your history of treatments and current medications, and we talked about the impact these issues are having on your daily life. We also discussed your concerns about potential surgery and the management of your opioid dependence.  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 We are referring you to spinal surgeon Willia Craze for an urgent consultation and ordering an urgent MRI of your lumbar spine. You should avoid lifting and activities that load your spine. You have been prescribed a one-month supply of Percocet (tucatinib 10-325 mg) to be used up to three times a day. Please call the surgeon and schedule an appointment as soon as possible, and go to the ER if your symptoms worsen or if you experience new symptoms like rectal numbness.  -OPIOID DEPENDENCE: Opioid dependence is a condition where the body relies on opioids, like Percocet, to function normally. We discussed the potential benefits of switching to Suboxone to manage your pain and reduce the risk of addiction, but you prefer to continue with Percocet and seek a new pain management doctor. We will continue your current prescription of Percocet until you see a pain management specialist. We also discussed the potential benefits of Suboxone as an alternative  to Percocet and will refer you to a new pain management specialist for ongoing management.  -GENERAL HEALTH MAINTENANCE: We discussed the importance of avoiding activities that could worsen your back pain, even though you have a high-quality mattress. Please avoid lifting and activities that load your spine. Follow up in 30 days or sooner if the MRI and neurosurgeon consultation are not progressing.  INSTRUCTIONS:  Please follow up in 30 days. Ensure you follow up with the spinal surgeon and pain management specialist. Call the surgeon and MRI facility to expedite your appointments.  It was a pleasure seeing you today! Your health and satisfaction are our top priorities.  Glenetta Hew, MD  Your Providers PCP: [name], MD,  [phone]) Referring Provider: [name], MD,  [phone]) Care Team Provider: London Sheer, MD,  [phone]) Care Team Provider: Madelyn Brunner, [place],  [phone]) Care Team Provider: Nevada Crane,  [phone]) Care Team Provider: [name], RN     NEXT STEPS: [x]  Early Intervention: Schedule sooner appointment, call our on-call services, or go to emergency room if there is any significant Increase in pain or discomfort New or worsening symptoms Sudden or severe changes in your health [x]  Flexible Follow-Up: We recommend a No follow-ups on file. for optimal routine care. This allows for progress monitoring and treatment adjustments. [x]  Preventive Care: Schedule your annual preventive care visit! It's typically covered by insurance and helps identify potential health issues early. [x]  Lab & X-ray Appointments: Incomplete tests scheduled today, or call to schedule. X-rays: [facility] (M-F, 8:30am-noon or 1pm-5pm). [x]  Medical Information Release: Sign a release form  at front desk to obtain relevant medical information we don't have.  MAKING THE MOST OF OUR FOCUSED 20 MINUTE APPOINTMENTS: [x]   Clearly  state your top concerns at the beginning of the visit to focus our discussion [x]   If you anticipate you will need more time, please inform the front desk during scheduling - we can book multiple appointments in the same week. [x]   If you have transportation problems- use our convenient video appointments or ask about transportation support. [x]   We can get down to business faster if you use MyChart to update information before the visit and submit non-urgent questions before your visit. Thank you for taking the time to provide details through MyChart.  Let our nurse know and she can import this information into your encounter documents.  Arrival and Wait Times: [x]   Arriving on time ensures that everyone receives prompt attention. [x]   Early morning (8a) and afternoon (1p) appointments tend to have shortest wait times. [x]   Unfortunately, we cannot delay appointments for late arrivals or hold slots during phone calls.  Getting Answers and Following Up [x]   Simple Questions & Concerns: For quick questions or basic follow-up after your visit, reach [place] at [phone] or MyChart messaging. [x]   Complex Concerns: If your concern is more complex, scheduling an appointment might be best. Discuss this with the staff to find the most suitable option. [x]   Lab & Imaging Results: We'll contact you directly if results are abnormal or you don't use MyChart. Most normal results will be on MyChart within 2-3 business days, with a review message from Dr. Jon Billings. Haven't heard back in 2 weeks? Need results sooner? Contact us at [phone]. [x]   Referrals: Our referral coordinator will manage specialist referrals. The specialist's office should contact you within 2 weeks to schedule an appointment. Call us if you haven't heard from them after 2 weeks.  Staying Connected [x]   MyChart: Activate your MyChart for the fastest way to access results and message [place]. See the last page of this paperwork for  instructions on how to activate.  Bring to Your Next Appointment [x]   Medications: Please bring all your medication bottles to your next appointment to ensure we have an accurate record of your prescriptions. [x]   Health Diaries: If you're monitoring any health conditions at home, keeping a diary of your readings can be very helpful for discussions at your next appointment.  Billing [x]   X-ray & Lab Orders: These are billed by separate companies. Contact the invoicing company directly for questions or concerns. [x]   Visit Charges: Discuss any billing inquiries with our administrative services team.  Your Satisfaction Matters [x]   Share Your Experience: We strive for your satisfaction! If you have any complaints, or preferably compliments, please let Dr. Jon Billings know directly or contact our Practice Administrators, Edwena Felty or [employer], by asking at the front desk.   Reviewing Your Records [x]   Review this early draft of your clinical encounter notes below and the final encounter summary tomorrow on MyChart after its been completed.  All orders placed so far are visible here: Disabling back pain -     oxyCODONE-Acetaminophen; Take 1 tablet by mouth every 8 (eight) hours as needed for pain.  Dispense: 90 tablet; Refill: 0 -     MR LUMBAR SPINE WO CONTRAST; Future  Chronic intractable pain -     oxyCODONE-Acetaminophen; Take 1 tablet by mouth every 8 (eight) hours as needed for pain.  Dispense: 90 tablet; Refill: 0 -  MR LUMBAR SPINE WO CONTRAST; Future  Cervical spinal stenosis -     oxyCODONE-Acetaminophen; Take 1 tablet by mouth every 8 (eight) hours as needed for pain.  Dispense: 90 tablet; Refill: 0  Fecal smearing -     MR LUMBAR SPINE WO CONTRAST; Future  Spinal stenosis of lumbar region with neurogenic claudication -     MR LUMBAR SPINE WO CONTRAST; Future

## 2023-01-15 NOTE — Assessment & Plan Note (Signed)
Opioid Dependence   His opioid dependence is managed with Percocet. We discussed the potential benefits of switching to Suboxone to manage pain and reduce addiction risk. He expressed a preference for continuing with Percocet and seeking a new pain management doctor. It was explained that Suboxone could provide effective pain relief with a lower risk of addiction compared to Percocet. We will continue the current prescription of Percocet as a bridge until he is seen by a pain management specialist, discuss the potential benefits of Suboxone as an alternative to Percocet, and refer him to a new pain management specialist for ongoing management.
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
## 2023-01-16 ENCOUNTER — Ambulatory Visit
Admission: RE | Admit: 2023-01-16 | Discharge: 2023-01-16 | Disposition: A | Discharge status: Home / Self Care | Payer: Medicaid Other | Source: Ambulatory Visit | Attending: Internal Medicine | Admitting: Internal Medicine

## 2023-01-16 ENCOUNTER — Other Ambulatory Visit: Payer: Self-pay

## 2023-01-16 DIAGNOSIS — M48062 Spinal stenosis, lumbar region with neurogenic claudication: Secondary | ICD-10-CM

## 2023-01-16 DIAGNOSIS — R151 Fecal smearing: Secondary | ICD-10-CM

## 2023-01-16 DIAGNOSIS — G8929 Other chronic pain: Secondary | ICD-10-CM

## 2023-01-16 DIAGNOSIS — M549 Dorsalgia, unspecified: Secondary | ICD-10-CM

## 2023-01-16 NOTE — Patient Instructions (Signed)
  Medicaid Managed Care   Unsuccessful Outreach Note  01/16/2023 Name: Bryan Valdez MRN: [id number] DOB: 21-Jul-1965  Referred by: [name], MD Reason for referral : High Risk Managed Medicaid (MM social work unsuccessful telephone outreach )   An unsuccessful telephone outreach was attempted today. The patient was referred to the case management team for assistance with care management and care coordination.   Follow Up Plan: The patient has been provided with contact information for the care management team and has been advised to call with any health related questions or concerns.   Abelino Derrick, MHA [facility]  Managed [facility] Social Worker [phone]

## 2023-01-16 NOTE — Patient Outreach (Signed)
  Medicaid Managed Care   Unsuccessful Outreach Note  01/16/2023 Name: Bryan Valdez MRN: [id number] DOB: 21-Jul-1965  Referred by: [name], MD Reason for referral : High Risk Managed Medicaid (MM social work unsuccessful telephone outreach )   An unsuccessful telephone outreach was attempted today. The patient was referred to the case management team for assistance with care management and care coordination.   Follow Up Plan: The patient has been provided with contact information for the care management team and has been advised to call with any health related questions or concerns.   Abelino Derrick, MHA [facility]  Managed [facility] Social Worker [phone]

## 2023-01-19 ENCOUNTER — Encounter: Payer: Self-pay | Admitting: Internal Medicine

## 2023-01-19 DIAGNOSIS — F1120 Opioid dependence, uncomplicated: Secondary | ICD-10-CM | POA: Insufficient documentation

## 2023-01-19 DIAGNOSIS — M48061 Spinal stenosis, lumbar region without neurogenic claudication: Secondary | ICD-10-CM | POA: Insufficient documentation

## 2023-01-21 ENCOUNTER — Ambulatory Visit: Payer: Medicaid Other | Admitting: Orthopedic Surgery

## 2023-01-21 DIAGNOSIS — M5441 Lumbago with sciatica, right side: Secondary | ICD-10-CM | POA: Diagnosis not present

## 2023-01-21 DIAGNOSIS — G8929 Other chronic pain: Secondary | ICD-10-CM

## 2023-01-21 NOTE — Progress Notes (Signed)
Orthopedic Spine Surgery Office Note  Assessment: Patient is a 57 y.o. male with chronic low back pain.  Here for rule out cauda equina.  Has no saddle anesthesia, intact rectal tone, intact PCR reflex which rules out cauda equina.  Most of his pain right now is in his low back.  He does have facet arthropathy at L4/5 that may be causing his back pain   Plan: -Patient had previously been in pain management with [facility], but had a bad experience that is no longer going there.  Provided him with a referral to [facility] medical for pain management -I explained that his exam is not consistent with cauda equina syndrome, so he does not need any emergent surgery -Recommended diagnostic/therapeutic L4/5 facet injections for his low back pain -He wanted to do knee injections again, so we will do those at our next office appointment -Patient should return to office in 4 weeks, x-rays at next visit: AP/lateral/flex/ex lumbar   Patient expressed understanding of the plan and all questions were answered to the patient's satisfaction.   ___________________________________________________________________________   History:  Patient is a 57 y.o. male who presents today for lumbar spine.  Patient has a history of chronic low back pain and was referred to my office urgently for possible cauda equina.  Patient states for the last several months, he has had residue in his intergluteal cleft that sometimes stains his underwear. He said it smears his underwear.  He has not had any loss of control of bowel movements.  He has had no urinary incontinence.  He knows when he needs to use the restroom for a bowel movement or to void. He has had no saddle anesthesia.  He has had progressively worsening back pain. He has had chronic pain that radiates into his right lower extremity. He feels it radiates down the posterior aspect of the right thigh and leg. He has not radiating pain into his left lower extremity. His low  back pain he feels in his lower lumbar spine and feels a pressure in the area of the sacrum. There was no trauma or injury that preceded the onset of his worsening low back pain.    Weakness: Yes, right leg feels weaker at times. No other weakness Symptoms of imbalance: Denies. Does not need any ambulatory assistive devices Clumsiness with hands: Denies Paresthesias and numbness: Yes, along posterior right thigh. No other numbness or paresthesias  Physical Exam:  BMI of 26.3  General: no acute distress, appears stated age Neurologic: alert, answering questions appropriately, following commands Respiratory: unlabored breathing on room air, symmetric chest rise Psychiatric: appropriate affect, normal cadence to speech   MSK (spine):  -Strength exam      Left  Right EHL    5/5  5/5 TA    5/5  5/5 GSC    5/5  5/5 Knee extension  5/5  5/5 Hip flexion   5/5  5/5  -Sensory exam    Sensation intact to light touch in L3-S1 nerve distributions of bilateral lower extremities  -Achilles DTR: 2/4 on the left, 2/4 on the right -Patellar tendon DTR: 1/4 on the left, 1/4 on the right  -Straight leg raise: negative bilaterally -Femoral nerve stretch test: negative bilaterally -Clonus: no beats bilaterally  -Special exams: No saddle anesthesia, intact rectal tone, intact bulbocavernosus reflex  -Left hip exam: Positive FADIR, negative FABER, negative Stinchfield, negative logroll -Right hip exam: Positive FADIR, negative FABER, negative Stinchfield, negative logroll  Imaging: MRI of the lumbar  spine from 01/16/2023 was independently reviewed and interpreted, showing central and lateral recess stenosis at L4/5.  Bilateral foraminal stenosis at L4/5.  Right-sided foraminal stenosis at L5/S1.  Significant epidural lipomatosis at L5/S1.  Spondylolisthesis seen at L4/5.  Facet arthropathy at L4/5.   Patient name: Bryan Valdez Patient MRN: [id number] Date of visit: 01/21/23

## 2023-02-03 NOTE — Telephone Encounter (Signed)
Informed patient of imaging results/notes.

## 2023-02-13 ENCOUNTER — Ambulatory Visit: Payer: Medicaid Other | Admitting: Physical Medicine and Rehabilitation

## 2023-02-13 ENCOUNTER — Other Ambulatory Visit: Payer: Self-pay | Admitting: Obstetrics and Gynecology

## 2023-02-13 ENCOUNTER — Other Ambulatory Visit: Payer: Self-pay

## 2023-02-13 DIAGNOSIS — M47816 Spondylosis without myelopathy or radiculopathy, lumbar region: Secondary | ICD-10-CM

## 2023-02-13 MED ORDER — METHYLPREDNISOLONE ACETATE 40 MG/ML IJ SUSP
40.0000 mg | Freq: Once | INTRAMUSCULAR | Status: AC
  Administered 2023-02-13: 40 mg

## 2023-02-13 NOTE — Patient Instructions (Signed)

## 2023-02-13 NOTE — Progress Notes (Signed)
Bryan Valdez - 57 y.o. male MRN [id number]  Date of birth: April 28, 1965  Office Visit Note: Visit Date: 02/13/2023 PCP: [name], MD Referred by: [name], MD  Subjective: Chief Complaint  Patient presents with   Lower Back - Pain   HPI:  Bryan Valdez is a 57 y.o. male who comes in today at the request of Dr. Willia Craze for planned Bilateral  L4-5 Lumbar facet/medial branch block with fluoroscopic guidance.  The patient has failed conservative care including home exercise, medications, time and activity modification.  This injection will be diagnostic and hopefully therapeutic.  Please see requesting physician notes for further details and justification.  Exam has shown concordant pain with facet joint loading.   ROS Otherwise per HPI.  Assessment & Plan: Visit Diagnoses:    ICD-10-CM   1. Spondylosis without myelopathy or radiculopathy, lumbar region  M47.816 methylPREDNISolone acetate (DEPO-MEDROL) injection 40 mg    XR C-ARM NO REPORT    Facet Injection      Plan: No additional findings.   Meds & Orders:  Meds ordered this encounter  Medications   methylPREDNISolone acetate (DEPO-MEDROL) injection 40 mg    Orders Placed This Encounter  Procedures   Facet Injection   XR C-ARM NO REPORT    Follow-up: Return for visit to requesting provider as needed.   Procedures: No procedures performed  Lumbar Facet Joint Intra-Articular Injection(s) with Fluoroscopic Guidance  Patient: Bryan Valdez      Date of Birth: [birth year] MRN: [id number] PCP: [name], MD      Visit Date: 02/13/2023   Universal Protocol:    Date/Time: 02/13/2023  Consent Given By: the patient  Position: PRONE   Additional Comments: Vital signs were monitored before and after the procedure. Patient was prepped and draped in the usual sterile fashion. The correct patient, procedure, and site was verified.   Injection Procedure Details:  Procedure Site One Meds  Administered:  Meds ordered this encounter  Medications   methylPREDNISolone acetate (DEPO-MEDROL) injection 40 mg     Laterality: Bilateral  Location/Site:  L4-L5  Needle size: 22 guage  Needle type: Spinal  Needle Placement: Articular  Findings:  -Comments: Excellent flow of contrast producing a partial arthrogram.  Procedure Details: The fluoroscope beam is vertically oriented in AP, and the inferior recess is visualized beneath the lower pole of the inferior apophyseal process, which represents the target point for needle insertion. When direct visualization is difficult the target point is located at the medial projection of the vertebral pedicle. The region overlying each aforementioned target is locally anesthetized with a 1 to 2 ml. volume of 1% Lidocaine without Epinephrine.   The spinal needle was inserted into each of the above mentioned facet joints using biplanar fluoroscopic guidance. A 0.25 to 0.5 ml. volume of Isovue-250 was injected and a partial facet joint arthrogram was obtained. A single spot film was obtained of the resulting arthrogram.    One to 1.25 ml of the steroid/anesthetic solution was then injected into each of the facet joints noted above.   Additional Comments:  No complications occurred Dressing: 2 x 2 sterile gauze and Band-Aid    Post-procedure details: Patient was observed during the procedure. Post-procedure instructions were reviewed.  Patient left the clinic in stable condition.    Clinical History: MRI LUMBAR SPINE WITHOUT CONTRAST   TECHNIQUE: Multiplanar, multisequence MR imaging of the lumbar spine was performed. No intravenous contrast was administered.   COMPARISON:  MRI of  the lumbar spine March 25, 2022.   FINDINGS: Segmentation:  Standard.   Alignment: Dextroconvex scoliosis. Grade 1 anterolisthesis of L4 over L5, unchanged.   Vertebrae: No evidence of fracture or aggressive bone lesion. Prominent degenerative  changes of the L5-S1 endplates with associated mild marrow edema, not significantly changed compared to prior MRI.   Conus medullaris and cauda equina: Conus extends to the T12-L1 level. Conus and cauda equina appear normal.   Paraspinal and other soft tissues: Negative.   Disc levels:   T12-L1: Moderate facet degenerative changes. No spinal canal or neural foraminal stenosis.   L1-2: Shallow disc bulge and mild-to-moderate facet degenerative changes without significant spinal canal or neural foraminal stenosis.   L2-3: Disc bulge,, mildly asymmetric to the left, mild facet degenerative changes, ligamentum flavum redundancy and mild prominence of the posterior epidural fat. Findings result in mild-to-moderate left subarticular zone stenosis and mild bilateral neural foraminal narrowing, unchanged.   L3-4: Left asymmetric disc bulge, moderate hypertrophic facet degenerative change ligamentum flavum redundancy, and moderate prominence of the posterior epidural fat. Findings result in mild spinal canal stenosis with moderate narrowing of the bilateral subarticular zones, left greater than right, mild right and moderate to severe left neural foraminal narrowing, similar to prior MRI.   L4-5: Disc bulge, prominent hypertrophic facet degenerative changes, ligamentum flavum redundancy and prominence of the epidural fat resulting in severe spinal canal stenosis and severe bilateral neural foraminal narrowing, similar to prior.   L5-S1: Disc bulge with associated osteophytic component, moderate facet degenerative changes and significant prominence of the epidural fat resulting in tapering of the thecal sac. Severe right and moderate left neural foraminal narrowing, similar to prior MRI.   IMPRESSION: 1. Multilevel degenerative changes of the lumbar spine, as described above, not significantly changed compared to prior MRI. 2. Severe spinal canal stenosis at L4-5. 3. Moderate to  severe left neural foraminal narrowing at L3-4. 4. Severe bilateral neural foraminal narrowing at L4-5. 5. Severe right and moderate left neural foraminal narrowing at L5-S1.     Electronically Signed   By: Baldemar Lenis M.D.   On: 01/16/2023 12:29     Objective:  VS:  HT:    WT:   BMI:     BP:   HR: bpm  TEMP: ( )  RESP:  Physical Exam Vitals and nursing note reviewed.  Constitutional:      General: He is not in acute distress.    Appearance: Normal appearance. He is not ill-appearing.  HENT:     Head: Normocephalic and atraumatic.     Right Ear: External ear normal.     Left Ear: External ear normal.     Nose: No congestion.  Eyes:     Extraocular Movements: Extraocular movements intact.  Cardiovascular:     Rate and Rhythm: Normal rate.     Pulses: Normal pulses.  Pulmonary:     Effort: Pulmonary effort is normal. No respiratory distress.  Abdominal:     General: There is no distension.     Palpations: Abdomen is soft.  Musculoskeletal:        General: No tenderness or signs of injury.     Cervical back: Neck supple.     Right lower leg: No edema.     Left lower leg: No edema.     Comments: Patient has good distal strength without clonus.  Skin:    Findings: No erythema or rash.  Neurological:     General: No focal  deficit present.     Mental Status: He is alert and oriented to person, place, and time.     Sensory: No sensory deficit.     Motor: No weakness or abnormal muscle tone.     Coordination: Coordination normal.  Psychiatric:        Mood and Affect: Mood normal.        Behavior: Behavior normal.      Imaging: No results found.

## 2023-02-13 NOTE — Patient Instructions (Signed)
Hi Bryan Valdez, I hope your appointment goes well today-have a nice afternoon!  Bryan Valdez was given information about Medicaid Managed Care team care coordination services as a part of their [facility] Medicaid benefit. Bryan Valdez verbally consented to engagement with the [facility] Managed Care team.   If you are experiencing a medical emergency, please call 911 or report to your local emergency department or urgent care.   If you have a non-emergency medical problem during routine business hours, please contact your provider's office and ask to speak with a nurse.   For questions related to your [facility] health plan, please call: [phone] or go here:[email or web address]  If you would like to schedule transportation through your [facility] plan, please call the following number at least 2 days in advance of your appointment: [phone].   You can also use the MTM portal or MTM mobile app to manage your rides. Reimbursement for transportation is available through [facility]! For the portal, please go to [email or web address]  Call the [facility] Crisis Line at [phone], at any time, 24 hours a day, 7 days a week. If you are in danger or need immediate medical attention call 911.  If you would like help to quit smoking, call 1-800-QUIT-NOW (236-455-6933) OR Espaol: 1-855-Djelo-Ya (0-272-536-6440) o para ms informacin haga clic aqu or Text READY to 347-425 to register via text  Bryan Valdez - following are the goals we discussed in your visit today:   Goals Addressed   None   Timeframe:  Long-Range Goal Priority:  High Start Date:   07/18/22                          Expected End Date:   ongoing                    Follow Up Date 03/14/23   - practice safe sex - schedule appointment for vaccines needed due to my age or health - schedule recommended health tests  - schedule and keep appointment for annual check-up    Why is this important?   Screening  tests can find diseases early when they are easier to treat.  Your doctor or nurse will talk with you about which tests are important for you.  Getting shots for common diseases like the flu and shingles will help prevent them.  02/13/23:  Has upcoming PCP and ORTHO appt  Patient verbalizes understanding of instructions and care plan provided today and agrees to view in MyChart. Active MyChart status and patient understanding of how to access instructions and care plan via MyChart confirmed with patient.     The Managed Medicaid care management team will reach out to the patient again over the next 30 business  days.  The  Patient  has been provided with contact information for the Managed Medicaid care management team and has been advised to call with any health related questions or concerns.   [name] RN, BSN   [facility] Care Management Coordinator - Managed Medicaid High Risk [phone]   Following is a copy of your plan of care:  Care Plan : RN Care Manager Plan of Care  Updates made by [name], RN since 02/13/2023 12:00 AM     Problem: Health Promotion or Disease Self-Management (General Plan of Care)      Long-Range Goal: Chronic Disease Management and Care Coordination Needs   Start Date: 07/18/2022  Expected End Date: 03/01/2023  Priority: High  Note:   Current Barriers:  Knowledge Deficits related to plan of care for management of GERD, chronic LBP with right sciatica, osteoarthritis Care Coordination needs related to food and housing needs  Chronic Disease Management support and education needs related to GERD, chronic LBP with right sciatica, osteoarthritis  Financial Constraints  02/13/23:  Has disability exam, hoping to hear determination soon.  To receive back injections today and knee injections 12/26.Awaiting appt with pain mgt.    RNCM Clinical Goal(s):  Patient will verbalize understanding of plan for management of GERD,  chronic LBP with right sciatica, osteoarthritis  as evidenced by patient report verbalize basic understanding of GERD, chronic LBP with right sciatica, osteoarthritis  disease process and self health management plan as evidenced by patient report take all medications exactly as prescribed and will call provider for medication related questions as evidenced by patient report demonstrate understanding of rationale for each prescribed medication as evidenced by patient report attend all scheduled medical appointments as evidenced by patient report and EMR review demonstrate ongoing adherence to prescribed treatment plan for GERD, chronic LBP with right sciatica, osteo as evidenced by patient report and EMR review work with Child psychotherapist to address  related to the management of food and housing needs related to the management of GERD, chronic LBP with right sciatica, osteoarthritis  as evidenced by review of EMR and patient or Child psychotherapist report through collaboration with Medical illustrator, provider, and care team.   Interventions: Pain Interventions:  (Status:  New goal.) Long Term Goal Pain assessment performed Medications reviewed Reviewed provider established plan for pain management Discussed importance of adherence to all scheduled medical appointments Counseled on the importance of reporting any/all new or changed pain symptoms or management strategies to pain management provider Advised patient to report to care team affect of pain on daily activities Assessed social determinant of health barriers  Inter-disciplinary care team collaboration (see longitudinal plan of care) Evaluation of current treatment plan related to  self management and patient's adherence to plan as established by provider 02/13/23:  patient currently receiving back and knee injections.  SDOH Barriers (Status:  New goal. and Goal on track:  Yes.) Long Term Goal Patient interviewed and SDOH assessment performed         SDOH Interventions    Patient interviewed and appropriate assessments performed Discussed plans with patient for ongoing care management follow up and provided patient with direct contact information for care management team Advised patient to contact provider regarding URO referral and back injections 07/18/22:  Patient has been provided with food and housing resources-5/21 by [facility] 08/21/22:  Collaborated with BSW for dental resources-referral placed-completed 02/13/23:  F/U appt scheduled d/t missed appt.  Patient Goals/Self-Care Activities: Take all medications as prescribed Attend all scheduled provider appointments Call pharmacy for medication refills 3-7 days in advance of running out of medications Perform all self care activities independently  Perform IADL's (shopping, preparing meals, housekeeping, managing finances) independently Call provider office for new concerns or questions  Work with the social worker to address care coordination needs and will continue to work with the clinical team to address health care and disease management related needs Patient interviewed and appropriate assessments performed Discussed plans with patient for ongoing care management follow up and provided patient with direct contact information for care management team Advised patient to contact provider regarding URO referral and back injections-completed 02/13/23:  patient to f/u on pain mgt referral  Follow Up Plan:  The  patient has been provided with contact information for the care management team and has been advised to call with any health related questions or concerns.  The care management team will reach out to the patient again over the next 45 business  days.
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
## 2023-02-13 NOTE — Progress Notes (Signed)
Functional Pain Scale - descriptive words and definitions  Unmanageable (7)  Pain interferes with normal ADL's/nothing seems to help/sleep is very difficult/active distractions are very difficult to concentrate on. Severe range order  Average Pain 7   +Driver, -BT, -Dye Allergies.  

## 2023-02-14 ENCOUNTER — Encounter: Payer: Self-pay | Admitting: Internal Medicine

## 2023-02-14 ENCOUNTER — Ambulatory Visit (INDEPENDENT_AMBULATORY_CARE_PROVIDER_SITE_OTHER): Payer: Medicaid Other | Admitting: Internal Medicine

## 2023-02-14 VITALS — BP 100/78 | HR 70 | Temp 98.0°F | Ht 72.0 in | Wt 193.0 lb

## 2023-02-14 DIAGNOSIS — M4802 Spinal stenosis, cervical region: Secondary | ICD-10-CM

## 2023-02-14 DIAGNOSIS — M549 Dorsalgia, unspecified: Secondary | ICD-10-CM

## 2023-02-14 DIAGNOSIS — G8929 Other chronic pain: Secondary | ICD-10-CM

## 2023-02-14 DIAGNOSIS — M48062 Spinal stenosis, lumbar region with neurogenic claudication: Secondary | ICD-10-CM

## 2023-02-14 DIAGNOSIS — M5441 Lumbago with sciatica, right side: Secondary | ICD-10-CM

## 2023-02-14 DIAGNOSIS — Z79899 Other long term (current) drug therapy: Secondary | ICD-10-CM

## 2023-02-14 DIAGNOSIS — Z599 Problem related to housing and economic circumstances, unspecified: Secondary | ICD-10-CM

## 2023-02-14 MED ORDER — CELECOXIB 200 MG PO CAPS: 200.0000 mg | ORAL_CAPSULE | Freq: Two times a day (BID) | ORAL | 3 refills | Status: AC

## 2023-02-14 MED ORDER — GABAPENTIN 300 MG PO CAPS: 300.0000 mg | ORAL_CAPSULE | Freq: Three times a day (TID) | ORAL | 3 refills | Status: DC

## 2023-02-14 MED ORDER — OXYCODONE-ACETAMINOPHEN 10-325 MG PO TABS: 1.0000 | ORAL_TABLET | Freq: Three times a day (TID) | ORAL | 0 refills | Status: DC | PRN

## 2023-02-14 MED ORDER — DULOXETINE HCL 30 MG PO CPEP: ORAL_CAPSULE | ORAL | 2 refills | Status: DC

## 2023-02-14 MED ORDER — CYCLOBENZAPRINE HCL 10 MG PO TABS: 10.0000 mg | ORAL_TABLET | Freq: Three times a day (TID) | ORAL | 5 refills | Status: DC | PRN

## 2023-02-14 NOTE — Assessment & Plan Note (Signed)
Chronic Low Back Pain Chronic low back pain at L5-S1 necessitates ongoing opioid management, complicated by insurance and provider issues. We will continue oxycodone 10 mg, 90 tablets every 30 days, and refer them to a new pain clinic for opioid management. They are instructed to upload a picture of pills to MyChart halfway through the month and schedule a follow-up in 30 days. Opioid risks, including dependency and regulatory scrutiny, were discussed, emphasizing adherence to the prescribing plan and documenting medication use.  Neuropathic Pain Neuropathic pain, likely secondary to chronic low back pain, is unmanaged by gabapentin, which could alleviate leg pain. We discussed the benefits and importance of consistent use and will prescribe gabapentin.

## 2023-02-14 NOTE — Assessment & Plan Note (Signed)
struggling to work with bad back have made things difficult- he has qualified for [employer]

## 2023-02-14 NOTE — Assessment & Plan Note (Signed)
Generalized Pain Management They are prescribed multiple medications, including Celebrex, duloxetine, and Flexeril, with inconsistent use. Duloxetine caused nausea at higher doses. We discussed the benefits and will start duloxetine at 30 mg to minimize side effects, prescribe Celebrex 200 mg, and Flexeril. We encourage consistent use of these medications, reserving Percocet for severe pain, and will print out a list of medications.

## 2023-02-14 NOTE — Patient Instructions (Addendum)
It was a pleasure seeing you today! Your health and satisfaction are our top priorities.  Glenetta Hew, MD  Your Providers PCP: [name], MD,  [phone]) Referring Provider: [name], MD,  [phone]) Care Team Provider: London Sheer, MD,  [phone]) Care Team Provider: Madelyn Brunner, [place],  [phone]) Care Team Provider: [place] Crane,  [phone]) Care Team Provider: [name], RN   VISIT SUMMARY:  During today's visit, we discussed your ongoing chronic back pain and the challenges you've faced with pain management due to recent changes in your insurance and provider issues. We reviewed your current medications and made adjustments to better manage your pain. We also talked about your recent experiences with your previous pain management clinic and your application for Social Security Disability benefits.  YOUR PLAN:  -CHRONIC LOW BACK PAIN: Chronic low back pain is a long-term pain in the lower back area. We will continue your current prescription of oxycodone 10 mg, 90 tablets every 30 days, and refer you to a new pain clinic for opioid management. Please upload a picture of your pills to MyChart halfway through the month and schedule a follow-up in 30 days. We discussed the risks of opioid use, including dependency, and the importance of following the prescribed plan.  -NEUROPATHIC PAIN: Neuropathic pain is pain caused by nerve damage, often resulting in a burning or shooting sensation. We will prescribe gabapentin to help manage this pain, especially in your legs. Consistent use of this medication is important for it to be effective.  -GENERALIZED PAIN MANAGEMENT: Generalized pain management involves using multiple medications to control overall pain. We will start duloxetine at 30 mg to minimize side effects, prescribe Celebrex 200 mg, and Flexeril. It's important to use these medications consistently and reserve Percocet for severe pain.  A printed list of your medications will be provided.  -GENERAL HEALTH MAINTENANCE: General health maintenance involves routine check-ups and lab tests to monitor your overall health. Since you haven't had recent blood work since 2016, we will order blood work at your next visit.  INSTRUCTIONS:  Please schedule a follow-up appointment in 30 days. Remember to upload a picture of your pills to MyChart halfway through the month. We will also order blood work at your next visit.  NEXT STEPS: [x]  Early Intervention: Schedule sooner appointment, call our on-call services, or go to emergency room if there is any significant Increase in pain or discomfort New or worsening symptoms Sudden or severe changes in your health [x]  Flexible Follow-Up: We recommend a No follow-ups on file. for optimal routine care. This allows for progress monitoring and treatment adjustments. [x]  Preventive Care: Schedule your annual preventive care visit! It's typically covered by insurance and helps identify potential health issues early. [x]  Lab & X-ray Appointments: Incomplete tests scheduled today, or call to schedule. X-rays: [facility] [place] (M-F, 8:30am-noon or 1pm-5pm). [x]  Medical Information Release: Sign a release form at front desk to obtain relevant medical information we don't have.  MAKING THE MOST OF OUR FOCUSED 20 MINUTE APPOINTMENTS: [x]   Clearly state your top concerns at the beginning of the visit to focus our discussion [x]   If you anticipate you will need more time, please inform the front desk during scheduling - we can book multiple appointments in the same week. [x]   If you have transportation problems- use our convenient video appointments or ask about transportation support. [x]   We can get down to business faster if you use MyChart to  update information before the visit and submit non-urgent questions before your visit. Thank you for taking the time to provide details through MyChart.   Let our nurse know and she can import this information into your encounter documents.  Arrival and Wait Times: [x]   Arriving on time ensures that everyone receives prompt attention. [x]   Early morning (8a) and afternoon (1p) appointments tend to have shortest wait times. [x]   Unfortunately, we cannot delay appointments for late arrivals or hold slots during phone calls.  Getting Answers and Following Up [x]   Simple Questions & Concerns: For quick questions or basic follow-up after your visit, reach [place] at [phone] or MyChart messaging. [x]   Complex Concerns: If your concern is more complex, scheduling an appointment might be best. Discuss this with the staff to find the most suitable option. [x]   Lab & Imaging Results: We'll contact you directly if results are abnormal or you don't use MyChart. Most normal results will be on MyChart within 2-3 business days, with a review message from Dr. Jon Billings. Haven't heard back in 2 weeks? Need results sooner? Contact us at [phone]. [x]   Referrals: Our referral coordinator will manage specialist referrals. The specialist's office should contact you within 2 weeks to schedule an appointment. Call us if you haven't heard from them after 2 weeks.  Staying Connected [x]   MyChart: Activate your MyChart for the fastest way to access results and message [place]. See the last page of this paperwork for instructions on how to activate.  Bring to Your Next Appointment [x]   Medications: Please bring all your medication bottles to your next appointment to ensure we have an accurate record of your prescriptions. [x]   Health Diaries: If you're monitoring any health conditions at home, keeping a diary of your readings can be very helpful for discussions at your next appointment.  Billing [x]   X-ray & Lab Orders: These are billed by separate companies. Contact the invoicing company directly for questions or concerns. [x]   Visit Charges: Discuss any billing  inquiries with our administrative services team.  Your Satisfaction Matters [x]   Share Your Experience: We strive for your satisfaction! If you have any complaints, or preferably compliments, please let Dr. Jon Billings know directly or contact our Practice Administrators, Edwena Felty or [employer], by asking at the front desk.   Reviewing Your Records [x]   Review this early draft of your clinical encounter notes below and the final encounter summary tomorrow on MyChart after its been completed.  All orders placed so far are visible here: Spinal stenosis of lumbar region with neurogenic claudication -     Ambulatory referral to Pain Clinic  Chronic intractable pain -     Ambulatory referral to Pain Clinic -     oxyCODONE-Acetaminophen; Take 1 tablet by mouth every 8 (eight) hours as needed for pain.  Dispense: 90 tablet; Refill: 0  Disabling back pain -     oxyCODONE-Acetaminophen; Take 1 tablet by mouth every 8 (eight) hours as needed for pain.  Dispense: 90 tablet; Refill: 0  Cervical spinal stenosis -     oxyCODONE-Acetaminophen; Take 1 tablet by mouth every 8 (eight) hours as needed for pain.  Dispense: 90 tablet; Refill: 0  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-sided sciatica -     Celecoxib; Take 1 capsule (200 mg total) by mouth 2 (two) times daily.  Dispense: 180 capsule; Refill: 3 -     DULoxetine HCl; TAKE 1 CAPSULE BY MOUTH ONCE A DAY FOR THE  FIRST WEEK. THEN INCREASE TO 2 BY MOUTH ONCE DAILY.  Dispense: 270 capsule; Refill: 2 -     Gabapentin; Take 1 capsule (300 mg total) by mouth 3 (three) times daily.  Dispense: 90 capsule; Refill: 3 -     Cyclobenzaprine HCl; Take 1 tablet (10 mg total) by mouth 3 (three) times daily as needed for muscle spasms.  Dispense: 90 tablet; Refill: 5
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
## 2023-02-20 ENCOUNTER — Ambulatory Visit: Payer: Medicaid Other | Admitting: Orthopedic Surgery

## 2023-02-20 DIAGNOSIS — M170 Bilateral primary osteoarthritis of knee: Secondary | ICD-10-CM

## 2023-02-20 NOTE — Procedures (Signed)
Lumbar Facet Joint Intra-Articular Injection(s) with Fluoroscopic Guidance  Patient: Bryan Valdez      Date of Birth: April 18, 1965 MRN: [id number] PCP: [name], MD      Visit Date: 02/13/2023   Universal Protocol:    Date/Time: 02/13/2023  Consent Given By: the patient  Position: PRONE   Additional Comments: Vital signs were monitored before and after the procedure. Patient was prepped and draped in the usual sterile fashion. The correct patient, procedure, and site was verified.   Injection Procedure Details:  Procedure Site One Meds Administered:  Meds ordered this encounter  Medications   methylPREDNISolone acetate (DEPO-MEDROL) injection 40 mg     Laterality: Bilateral  Location/Site:  L4-L5  Needle size: 22 guage  Needle type: Spinal  Needle Placement: Articular  Findings:  -Comments: Excellent flow of contrast producing a partial arthrogram.  Procedure Details: The fluoroscope beam is vertically oriented in AP, and the inferior recess is visualized beneath the lower pole of the inferior apophyseal process, which represents the target point for needle insertion. When direct visualization is difficult the target point is located at the medial projection of the vertebral pedicle. The region overlying each aforementioned target is locally anesthetized with a 1 to 2 ml. volume of 1% Lidocaine without Epinephrine.   The spinal needle was inserted into each of the above mentioned facet joints using biplanar fluoroscopic guidance. A 0.25 to 0.5 ml. volume of Isovue-250 was injected and a partial facet joint arthrogram was obtained. A single spot film was obtained of the resulting arthrogram.    One to 1.25 ml of the steroid/anesthetic solution was then injected into each of the facet joints noted above.   Additional Comments:  No complications occurred Dressing: 2 x 2 sterile gauze and Band-Aid    Post-procedure details: Patient was observed during the  procedure. Post-procedure instructions were reviewed.  Patient left the clinic in stable condition.

## 2023-02-20 NOTE — Progress Notes (Signed)
Orthopedic Surgery Procedure Note  Bilateral knee injection note: After discussing the risk, benefits, alternatives of bilateral knee injections, patient elected to proceed.  The patient was in the seated position with the knees at 90 degrees.  I started with the right knee.  The anterior lateral soft spot was prepped with an alcohol based prep.  Ethyl chloride was used to anesthetize the area.  A 20-gauge needle was used to inject 1 cc of bupivacaine, 1 cc of lidocaine, 1 cc of Depo-Medrol under standard sterile technique.  Needle was withdrawn and Band-Aid was applied.  Attention was then turned to the left leg.  The anterior lateral soft spot was prepped with an alcohol based prep.  Ethyl chloride was used to anesthetize the skin.  A 20-gauge needle was used to inject 1 cc of bupivacaine, 1 cc of lidocaine, 1 cc of Depo-Medrol under standard sterile technique.  Needle was withdrawn and Band-Aid was applied.  Patient tolerated procedure well.  Patient can return to the office on an as-needed basis  London Sheer, MD Orthopedic Surgeon

## 2023-02-25 ENCOUNTER — Other Ambulatory Visit: Payer: Self-pay

## 2023-02-25 NOTE — Patient Instructions (Signed)
Visit Information  Bryan Valdez was given information about Medicaid Managed Care team care coordination services as a part of their [facility] Medicaid benefit. Bryan Valdez verbally consented to engagement with the [facility] Managed Care team.   If you are experiencing a medical emergency, please call 911 or report to your local emergency department or urgent care.   If you have a non-emergency medical problem during routine business hours, please contact your provider's office and ask to speak with a nurse.   For questions related to your [facility] health plan, please call: [phone] or go here:[email or web address]  If you would like to schedule transportation through your [facility] plan, please call the following number at least 2 days in advance of your appointment: [phone].   You can also use the MTM portal or MTM mobile app to manage your rides. Reimbursement for transportation is available through [facility]! For the portal, please go to [email or web address]  Call the [facility] Crisis Line at [phone], at any time, 24 hours a day, 7 days a week. If you are in danger or need immediate medical attention call 911.  If you would like help to quit smoking, call 1-800-QUIT-NOW ((272)433-1353) OR Espaol: 1-855-Djelo-Ya (4-034-742-5956) o para ms informacin haga clic aqu or Text READY to 387-564 to register via text  Bryan Valdez - following are the goals we discussed in your visit today:   Goals Addressed   None       The  Patient                                              has been provided with contact information for the Managed Medicaid care management team and has been advised to call with any health related questions or concerns.   Gus Puma, Kenard Gower, MHA [facility]  Managed Medicaid Social Worker [phone]   Following is a copy of your plan of care:  There are no care plans that you recently modified to display for this  patient.
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
## 2023-03-14 ENCOUNTER — Other Ambulatory Visit: Payer: Self-pay | Admitting: Obstetrics and Gynecology

## 2023-03-14 NOTE — Patient Instructions (Signed)
Hi [name], I am sorry I missed you today, I hope you are okay  as a part of your Medicaid benefit, you are eligible for care management and care coordination services at no cost or copay. I was unable to reach you by phone today but would be happy to help you with your health related needs. Please feel free to call me at [phone].  A member of the Managed Medicaid care management team will reach out to you again over the next 30 business  days.   [name] RN, BSN, [employer] [facility] RN Care Manager Direct Dial [phone]/QIO [phone] Website: [email or web address]

## 2023-03-14 NOTE — Patient Outreach (Signed)
  Medicaid Managed Care   Unsuccessful Attempt Note   03/14/2023 Name: Bryan Valdez MRN: [id number] DOB: 1965-03-06  Referred by: [name], MD Reason for referral : High Risk Managed Medicaid (Unsuccessful telephone outreach)  An unsuccessful telephone outreach was attempted today. The patient was referred to the case management team for assistance with care management and care coordination.    Follow Up Plan: The Managed Medicaid care management team will reach out to the patient again over the next 30 business  days. and The  Patient has been provided with contact information for the Managed Medicaid care management team and has been advised to call with any health related questions or concerns.   [name] RN, BSN, [employer] [facility] RN Care Manager Direct Dial [phone]/[phone] Website: [email or web address]

## 2023-03-19 ENCOUNTER — Ambulatory Visit (INDEPENDENT_AMBULATORY_CARE_PROVIDER_SITE_OTHER): Payer: Medicaid Other | Admitting: Internal Medicine

## 2023-03-19 ENCOUNTER — Encounter: Payer: Self-pay | Admitting: Internal Medicine

## 2023-03-19 VITALS — BP 129/84 | HR 62 | Temp 97.2°F | Ht 72.0 in | Wt 193.8 lb

## 2023-03-19 DIAGNOSIS — Z599 Problem related to housing and economic circumstances, unspecified: Secondary | ICD-10-CM

## 2023-03-19 DIAGNOSIS — G8929 Other chronic pain: Secondary | ICD-10-CM | POA: Diagnosis not present

## 2023-03-19 DIAGNOSIS — M4802 Spinal stenosis, cervical region: Secondary | ICD-10-CM | POA: Diagnosis not present

## 2023-03-19 DIAGNOSIS — M549 Dorsalgia, unspecified: Secondary | ICD-10-CM | POA: Diagnosis not present

## 2023-03-19 MED ORDER — OXYCODONE-ACETAMINOPHEN 10-325 MG PO TABS: 1.0000 | ORAL_TABLET | Freq: Three times a day (TID) | ORAL | 0 refills | Status: DC | PRN
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
## 2023-03-20 ENCOUNTER — Other Ambulatory Visit: Payer: Self-pay | Admitting: Obstetrics and Gynecology

## 2023-03-20 ENCOUNTER — Telehealth: Payer: Self-pay | Admitting: Internal Medicine

## 2023-03-20 NOTE — Assessment & Plan Note (Signed)
Chronic Back Pain with Lumbar Disc Degeneration Chronic back pain results from severe lumbar disc degeneration at L4-L5 and L5-S1, with bone-on-bone contact, bone spurs, and misalignment causing significant pain and functional impairment. Surgery is not currently an option, but further degeneration may require it. Long-term opioid therapy with Percocet has been used, though pain management specialists are hesitant to continue. A transition to Suboxone, which is less addictive and has a lower risk profile, was discussed. Avoid heavy lifting and strenuous activities to prevent further spinal damage. Application for disability benefits is encouraged. Prescribe Percocet for one month and refer to another pain management specialist. Discuss transitioning to Suboxone if he is not able to re-establish with pain specialist for percocet, but will bridge another month for him.

## 2023-03-20 NOTE — Telephone Encounter (Signed)
Type of form received: Handicap placard  Additional comments: NA  Received by:  Bryan Valdez should be Faxed to: NA  Form should be mailed to:  NA  Is patient requesting call for pickup: YES   Form placed:  Provider folder  Attach charge sheet. YES  Individual made aware of 3-5 business day turn around (Y/N)? YES

## 2023-03-20 NOTE — Patient Instructions (Signed)
Hi [name] you for the updates today-have a nice rest of the day.  [name] was given information about Medicaid Managed Care team care coordination services as a part of their [facility] Medicaid benefit. Bryshawn Bryan Valdez verbally consented to engagement with the [facility] Managed Care team.   If you are experiencing a medical emergency, please call 911 or report to your local emergency department or urgent care.   If you have a non-emergency medical problem during routine business hours, please contact your provider's office and ask to speak with a nurse.   For questions related to your [facility] health plan, please call: [phone] or go here:[email or web address]  If you would like to schedule transportation through your [facility] plan, please call the following number at least 2 days in advance of your appointment: [phone].   You can also use the MTM portal or MTM mobile app to manage your rides. Reimbursement for transportation is available through [facility]! For the portal, please go to [email or web address]  Call the [facility] Crisis Line at [phone], at any time, 24 hours a day, 7 days a week. If you are in danger or need immediate medical attention call 911.  If you would like help to quit smoking, call 1-800-QUIT-NOW ((510)460-7803) OR Espaol: 1-855-Djelo-Ya (2-951-884-1660) o para ms informacin haga clic aqu or Text READY to 630-160 to register via text  [name] - following are the goals we discussed in your visit today:   Goals Addressed    Timeframe:  Long-Range Goal Priority:  High Start Date:   07/18/22                          Expected End Date:   ongoing                    Follow Up Date 04/22/23   - practice safe sex - schedule appointment for vaccines needed due to my age or health - schedule recommended health tests  - schedule and keep appointment for annual check-up    Why is this important?   Screening tests can  find diseases early when they are easier to treat.  Your doctor or nurse will talk with you about which tests are important for you.  Getting shots for common diseases like the flu and shingles will help prevent them.  03/20/23:  patient seen by PCP yesterday-referral placed to [facility]  Patient verbalizes understanding of instructions and care plan provided today and agrees to view in MyChart. Active MyChart status and patient understanding of how to access instructions and care plan via MyChart confirmed with patient.     The Managed Medicaid care management team will reach out to the patient again over the next 45 business  days.  The  Patient has been provided with contact information for the Managed Medicaid care management team and has been advised to call with any health related questions or concerns.   [name] RN, BSN, [employer] [facility] Health RN Care Manager Direct Dial [phone]/UKG [phone] Website: [email or web address]   Following is a copy of your plan of care:  Care Plan : RN Care Manager Plan of Care  Updates made by [name], RN since 03/20/2023 12:00 AM     Problem: Health Promotion or Disease Self-Management (General Plan of Care)      Long-Range Goal: Chronic Disease Management and Care Coordination Needs   Start Date: 07/18/2022  Expected End  Date: 06/18/2023  Priority: High  Note:   Current Barriers:  Knowledge Deficits related to plan of care for management of GERD, chronic LBP with right sciatica, osteoarthritis Care Coordination needs related to food and housing needs  Chronic Disease Management support and education needs related to GERD, chronic LBP with right sciatica, osteoarthritis  Financial Constraints  03/20/23:  Referral placed to [facility].  Plans on getting handicap plaquard. Has applied for LEAP program.  Also will investigate assistance with insulation and windows with [employer].  BSW referral placed  also.    RNCM Clinical Goal(s):  Patient will verbalize understanding of plan for management of GERD, chronic LBP with right sciatica, osteoarthritis  as evidenced by patient report verbalize basic understanding of GERD, chronic LBP with right sciatica, osteoarthritis  disease process and self health management plan as evidenced by patient report take all medications exactly as prescribed and will call provider for medication related questions as evidenced by patient report demonstrate understanding of rationale for each prescribed medication as evidenced by patient report attend all scheduled medical appointments as evidenced by patient report and EMR review demonstrate ongoing adherence to prescribed treatment plan for GERD, chronic LBP with right sciatica, osteo as evidenced by patient report and EMR review work with Child psychotherapist to address  related to the management of food and housing needs related to the management of GERD, chronic LBP with right sciatica, osteoarthritis  as evidenced by review of EMR and patient or Child psychotherapist report through collaboration with Medical illustrator, provider, and care team.   Interventions: Pain Interventions:  (Status:  New goal.) Long Term Goal Pain assessment performed Medications reviewed Reviewed provider established plan for pain management Discussed importance of adherence to all scheduled medical appointments Counseled on the importance of reporting any/all new or changed pain symptoms or management strategies to pain management provider Advised patient to report to care team affect of pain on daily activities Assessed social determinant of health barriers  Inter-disciplinary care team collaboration (see longitudinal plan of care) Evaluation of current treatment plan related to  self management and patient's adherence to plan as established by provider 03/20/23:  [facility] referral placed by PCP.  SDOH Barriers (Status:  New goal. and Goal on  track:  Yes.) Long Term Goal Patient interviewed and SDOH assessment performed        SDOH Interventions    Patient interviewed and appropriate assessments performed Discussed plans with patient for ongoing care management follow up and provided patient with direct contact information for care management team Advised patient to contact provider regarding URO referral and back injections 03/20/23:  BSW referral for any assistance with home improvements to help with power bill-insulation, windows.  Patient states he will contact [facility] as well.  Patient Goals/Self-Care Activities: Take all medications as prescribed Attend all scheduled provider appointments Call pharmacy for medication refills 3-7 days in advance of running out of medications Perform all self care activities independently  Perform IADL's (shopping, preparing meals, housekeeping, managing finances) independently Call provider office for new concerns or questions  Work with the social worker to address care coordination needs and will continue to work with the clinical team to address health care and disease management related needs Patient interviewed and appropriate assessments performed Discussed plans with patient for ongoing care management follow up and provided patient with direct contact information for care management team Advised patient to contact provider regarding URO referral and back injections-completed 03/20/23:  patient to contact  [employer] for any assistance with home improvements, power bill.  Follow Up Plan:  The patient has been provided with contact information for the care management team and has been advised to call with any health related questions or concerns.  The care management team will reach out to the patient again over the next 45 business  days.

## 2023-03-20 NOTE — Patient Instructions (Signed)
VISIT SUMMARY:  During your visit, we discussed your ongoing chronic back pain, financial difficulties, and employment challenges. We reviewed your dissatisfaction with recent pain management and explored alternative options for managing your pain and improving your financial situation.  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 We will continue with Percocet for one month and refer you to another pain management specialist. We also discussed the possibility of transitioning to Suboxone, which is less addictive. Avoid heavy lifting and strenuous activities to prevent further damage. Consider applying for disability benefits.  -FINANCIAL AND SOCIAL INSTABILITY: Financial difficulties and employment barriers are affecting your ability to meet basic needs. 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 Contact 211 for financial assistance and consider these work options if they are physically feasible.  INSTRUCTIONS:  Please schedule a follow-up appointment for March 20, 2023, after your next pain management appointment. We will reassess your pain management plan and review your blood work at that time.
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
## 2023-03-20 NOTE — Telephone Encounter (Signed)
Pt is requesting assistance with obtaining a handicap placard from the [facility].  Please let him know what he needs to do. Ph# [phone]

## 2023-03-20 NOTE — Assessment & Plan Note (Signed)
Financial and Social Instability; he has an impossible situation, where he cannot obtain gainful employment with felony record and bad degenerative disk disease , yet is not eligible for food stamps due to felony and is getting delayed on disability claim.  Financial distress significantly impacts the ability to maintain basic needs, with employment barriers due to a criminal record and legal issues related to child support. 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 Explained the types of lifting he can do for work without flaring back pain.  Advise contacting 211 for financial assistance and encourage exploring felon-friendly work options.  He has already explored expungement options

## 2023-03-25 ENCOUNTER — Telehealth: Payer: Self-pay

## 2023-03-25 NOTE — Progress Notes (Signed)
Care Guide Pharmacy Note  03/25/2023 Name: Bryan Valdez MRN: [id number] DOB: November 04, 1965  Referred By: [name], MD Reason for referral: Care Coordination (Outreach to schedule with Pharm d )   Bryan Valdez is a 58 y.o. year old male who is a primary care patient of [name], MD.  [name] was referred to the pharmacist for assistance related to:  Med assistance   An unsuccessful telephone outreach was attempted today to contact the patient who was referred to the pharmacy team for assistance with medication assistance. Additional attempts will be made to contact the patient.  Penne Lash , RMA     [facility]  [facility], [facility] Guide  Direct Dial: [phone]  Website: [email or web address]

## 2023-03-27 NOTE — Telephone Encounter (Signed)
Called patient and scheduled him for a VV with Dr. Jon Billings on 2/13 at 10:40am to get this form completed.

## 2023-04-01 NOTE — Progress Notes (Signed)
 Care Guide Pharmacy Note  04/01/2023 Name: Bryan Valdez MRN: [id number] DOB: December 12, 1965  Referred By: Jesus Bernardino MATSU, MD Reason for referral: Care Coordination (Outreach to schedule with Pharm d )   Bryan Valdez is a 58 y.o. year old male who is a primary care patient of Jesus Bernardino MATSU, MD.  Bryan Valdez was referred to the pharmacist for assistance related to:  med assistance   Successful contact was made with the patient to discuss pharmacy services.  Patient declines engagement at this time. Contact information was provided to the patient should they wish to reach out for assistance at a later time.  Bryan Valdez , RMA     [facility]  [facility], [facility] Guide  Direct Dial: [phone]  Website: [email or web address]

## 2023-04-02 ENCOUNTER — Other Ambulatory Visit: Payer: Self-pay
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
## 2023-04-02 NOTE — Patient Instructions (Signed)
Visit Information  Bryan Valdez was given information about Medicaid Managed Care team care coordination services as a part of their [facility] Medicaid benefit. Bryan Valdez verbally consented to engagement with the [facility] Managed Care team.   If you are experiencing a medical emergency, please call 911 or report to your local emergency department or urgent care.   If you have a non-emergency medical problem during routine business hours, please contact your provider's office and ask to speak with a nurse.   For questions related to your [facility] health plan, please call: [phone] or go here:[email or web address]  If you would like to schedule transportation through your [facility] plan, please call the following number at least 2 days in advance of your appointment: [phone].   You can also use the MTM portal or MTM mobile app to manage your rides. Reimbursement for transportation is available through [facility]! For the portal, please go to [email or web address]  Call the [facility] Crisis Line at [phone], at any time, 24 hours a day, 7 days a week. If you are in danger or need immediate medical attention call 911.  If you would like help to quit smoking, call 1-800-QUIT-NOW ((213)878-2653) OR Espaol: 1-855-Djelo-Ya (4-132-440-1027) o para ms informacin haga clic aqu or Text READY to 253-664 to register via text  Bryan Valdez - following are the goals we discussed in your visit today:   Goals Addressed   None     Social Worker will follow up in 30 days.  Bryan Valdez, Bryan Valdez, MHA [facility]  Managed Medicaid Social Worker [phone]   Following is a copy of your plan of care:  There are no care plans that you recently modified to display for this patient.

## 2023-04-10 ENCOUNTER — Encounter: Payer: Self-pay | Admitting: Internal Medicine

## 2023-04-10 ENCOUNTER — Telehealth: Payer: Medicaid Other | Admitting: Internal Medicine

## 2023-04-10 DIAGNOSIS — M549 Dorsalgia, unspecified: Secondary | ICD-10-CM

## 2023-04-10 DIAGNOSIS — M48062 Spinal stenosis, lumbar region with neurogenic claudication: Secondary | ICD-10-CM | POA: Diagnosis not present

## 2023-04-10 DIAGNOSIS — Z79899 Other long term (current) drug therapy: Secondary | ICD-10-CM

## 2023-04-10 DIAGNOSIS — M4802 Spinal stenosis, cervical region: Secondary | ICD-10-CM

## 2023-04-10 DIAGNOSIS — Z599 Problem related to housing and economic circumstances, unspecified: Secondary | ICD-10-CM

## 2023-04-10 DIAGNOSIS — G8929 Other chronic pain: Secondary | ICD-10-CM

## 2023-04-10 DIAGNOSIS — M5441 Lumbago with sciatica, right side: Secondary | ICD-10-CM

## 2023-04-10 DIAGNOSIS — K2100 Gastro-esophageal reflux disease with esophagitis, without bleeding: Secondary | ICD-10-CM

## 2023-04-10 MED ORDER — OXYCODONE-ACETAMINOPHEN 10-325 MG PO TABS: 1.0000 | ORAL_TABLET | Freq: Three times a day (TID) | ORAL | 0 refills | Status: DC | PRN

## 2023-04-10 MED ORDER — FAMOTIDINE 20 MG PO TABS: 20.0000 mg | ORAL_TABLET | Freq: Two times a day (BID) | ORAL | 3 refills | Status: DC

## 2023-04-10 MED ORDER — CYCLOBENZAPRINE HCL 10 MG PO TABS: 10.0000 mg | ORAL_TABLET | Freq: Three times a day (TID) | ORAL | 5 refills | Status: AC | PRN

## 2023-04-10 MED ORDER — GABAPENTIN 300 MG PO CAPS: 300.0000 mg | ORAL_CAPSULE | Freq: Three times a day (TID) | ORAL | 3 refills | Status: DC

## 2023-04-10 NOTE — Assessment & Plan Note (Signed)
Mobility Issues Mobility issues necessitate a permanent handicap sticker. Provide the sticker and instruct to pick it up from the office or have it mailed.
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
## 2023-04-10 NOTE — Assessment & Plan Note (Signed)
Steroid injection(s) Dr. Christell Constant was unhelpful per patient report - advised patient to let Dr. Christell Constant know and get follow up appointment.
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
## 2023-04-10 NOTE — Assessment & Plan Note (Signed)
I continued to offer to support disability
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
## 2023-04-10 NOTE — Assessment & Plan Note (Signed)
Follow-up Schedule a follow-up appointment in 30 days. Encourage contacting the pain management specialist and spine surgeon to ensure appointments are scheduled.

## 2023-04-10 NOTE — Assessment & Plan Note (Signed)
Acid Reflux  Acid reflux persists but is managed with medication. Continue the current acid reflux medication. To prevent stress ulcer from chronic pain

## 2023-04-21 ENCOUNTER — Ambulatory Visit (INDEPENDENT_AMBULATORY_CARE_PROVIDER_SITE_OTHER): Payer: Medicaid Other | Admitting: Internal Medicine

## 2023-04-21 ENCOUNTER — Encounter: Payer: Self-pay | Admitting: Internal Medicine

## 2023-04-21 VITALS — BP 134/86 | HR 69 | Temp 98.5°F | Ht 72.0 in | Wt 196.6 lb

## 2023-04-21 DIAGNOSIS — R319 Hematuria, unspecified: Secondary | ICD-10-CM

## 2023-04-21 DIAGNOSIS — M549 Dorsalgia, unspecified: Secondary | ICD-10-CM

## 2023-04-21 DIAGNOSIS — G8929 Other chronic pain: Secondary | ICD-10-CM

## 2023-04-21 DIAGNOSIS — M4802 Spinal stenosis, cervical region: Secondary | ICD-10-CM

## 2023-04-21 DIAGNOSIS — N182 Chronic kidney disease, stage 2 (mild): Secondary | ICD-10-CM

## 2023-04-21 DIAGNOSIS — Z599 Problem related to housing and economic circumstances, unspecified: Secondary | ICD-10-CM

## 2023-04-21 MED ORDER — OXYCODONE-ACETAMINOPHEN 10-325 MG PO TABS: 1.0000 | ORAL_TABLET | Freq: Three times a day (TID) | ORAL | 0 refills | Status: DC | PRN

## 2023-04-21 NOTE — Telephone Encounter (Signed)
 Copied from CRM [id number]. Topic: Clinical - Prescription Issue >> Apr 21, 2023 11:12 AM Elizebeth Brooking wrote: Reason for CRM: Patient called in stating that he needs a prior authorization for medication  oxyCODONE-acetaminophen (PERCOCET) 10-325 MG tablet     I have requested that our PA team complete a prior authorization for oxycodone for patient. Donzetta Starch, CMA

## 2023-04-21 NOTE — Patient Instructions (Addendum)
 VISIT SUMMARY:  During today's visit, we discussed your chronic pain management, potential back surgery, and concerns about hematuria. We also reviewed your medication regimen and general health maintenance.  YOUR PLAN:  -CHRONIC PAIN MANAGEMENT: Chronic pain is long-lasting pain that can be managed with medications and other treatments. We discussed the importance of taking Cymbalta daily for it to be effective. We will refill your oxycodone prescription and continue with your current medications. You should also consider back surgery and discuss its potential benefits and risks with a surgeon.  -SPINAL STENOSIS: Spinal stenosis is a narrowing of the spaces within your spine, which can cause pain, leg weakness, and numbness. Surgery may help relieve pain by decompressing the spinal canal. Please consult with a surgeon about the benefits and risks of surgery and avoid heavy lifting.  -HEMATURIA: Hematuria is the presence of blood in the urine, which can be a sign of prostate cancer. Your last PSA test was normal, but it is important to monitor for prostate cancer. We will obtain a urine sample for analysis and you should follow up with a urologist for further evaluation.  -GENERAL HEALTH MAINTENANCE: Given your mild kidney disease and family history of cancer and heart disease, it is important to have regular follow-ups and monitor your health. We will continue to monitor your kidney function and other potential health issues.  INSTRUCTIONS:  Please schedule a follow-up appointment in one month and bring any necessary paperwork for financial and health assistance. Additionally, consult with a surgeon about the potential benefits and risks of back surgery, and follow up with a urologist for further evaluation of hematuria.  # [employer]  If you're experiencing homelessness or at risk of becoming homeless, these resources can help. Each organization offers different services, so  consider contacting multiple options for comprehensive assistance.  ## Immediate Assistance and Day Services  ### Loss adjuster, chartered [facility]) - **Phone:** [phone] - **Address:** [address] [place], [place] [address] - **Hours:** Monday-Friday (call for exact hours) - **Best for:** Immediate daytime assistance, housing referrals, job search help, and basic needs (mail/phone/shower/laundry/healthcare) - **Services:** Day shelter, housing assistance, job search, IT trainer, showers, Pharmacologist, healthcare, case management  ## Emergency Shelter and Housing Assistance  ### [employer] - **Phone:** [phone] - **Best for:** Emergency shelter and immediate housing needs - Research scientist (physical sciences):** Emergency assistance, shelter, and housing support  ### [employer] [facility] [place] - **Phone:** [phone] - **Best for:** Emergency shelter and transitional housing - **Services:** Emergency shelter and transitional housing programs  ## Housing Counseling and Long-term Solutions  ### [employer] - **Phone:** [phone] - **Best for:** Finding affordable housing options and housing counseling - **Services:** Housing counseling, assistance in finding affordable housing  ### [facility]eless[facility] - **Phone:** [phone] - **Best for:** Coordinated access to homeless services in [place] - **Services:** Information and referrals to various homeless services  ## General Assistance and Referrals  ### [employer] [place] 211 - **Phone:** Dial 211 - **Best for:** Comprehensive information and referrals to various social services - **Services:** Connects individuals to housing and other social services  ### [facility] Department of Social Services - **Phone:** [phone] - **Best for:** Information on government assistance programs - **Services:** Information on emergency housing assistance  and other social services  ## Additional Resources  ### Homeless Prevention Coalition of [place] - **Phone:** [phone] - **Best for:** Resources and programs focused on preventing homelessness - **Services:** Works to  prevent and end homelessness in the area  ### [employer] - **Phone:** [phone] - **Best for:** Long-term affordable housing solutions - **Services:** Public housing and Section 8 voucher programs  ### [facility] the [place] - **Phone:** [phone] - **Best for:** Domestic violence victims needing shelter - **Services:** Emergency shelter for domestic violence victims, counseling  ## Important Tips: 1. Be prepared to provide basic information about your situation when calling. 2. Services and availability may vary, so it's best to contact multiple organizations. 3. If you're in immediate danger or have a medical emergency, call 911. 4. Keep this list handy and don't hesitate to reach out for help. 5. Many of these services can also provide or connect you with resources for food, healthcare, and job assistance.  Remember, seeking help is a sign of strength. These organizations are here to support you through this challenging time.   TRANSPORTATION FOR OLDER ADULTS IN [facility] [employer]  is a Music therapist on behalf of the [employer] Roundtable of [place].  Introduction If you are an older adult or the caregiver of an older adult or disabled person who needs transportation for medical, personal, or social reasons, there is a range of options available in [place], [place]. Your first step should be to contact [facility] at [phone]. [place] is dedicated to [employer], Personal assistant.  If you have a Medicaid "blue card" or "pink card" and have no other means  of transportation for a medical appointment go to [employer]. If you do not have a Medicaid card, please go to Other Transportation Options.  Medicaid Transportation Citizens throughout [place] with a Medicaid "blue card" or "pink card" who do not have other means of transportation are entitled to rides to [employer], hospitals, clinics, dentists, and other health related trip needs.   Transportation services are available to [place], [place], [place],        [place], [place], and other [place] locations. There is no charge to the rider for this        service. Trips to [facility] and [place] are provided in association with [employer].  Services are provided between 6:00 a.m. and 9:00p.m. Monday - Friday.  Call [phone] for information or to schedule a trip.   Other Transportation Options If you do not have a Medicaid card and are over 60 call SeniorLine at [phone] in [place] and [place] and [phone] in the rest of [place]. SeniorLine may offer you a range of services that are available based on your situation.  If you need transportation for non-medical rides in [place] see [facility].  If you need transportation for medical rides throughout [facility], including       [address] and [place], see [employer].  If you need transportation for medical and non-medical reasons in [facility] see       Dial-a-Lift.  If you need transportation for medical and non-medical reasons in [facility]        but outside the [place] or [place] city limits see [employer] and [employer].  In addition, some services are provided regardless of age.  If you need transportation for rides to cancer treatment, see American Cancer Society Patient Services.  If you have a disability and need transportation in [place]  see Specialized       Community Assisted Transportation (SCAT)  and in [facility] see Dial-a-Lift.       The [facility] For non-medical rides in [place], [facility] [phone] provides rides for:  Individuals 60 and over  Travel anywhere in [place] for non-medical reasons. Travel must be scheduled        at least 3 days in advance. Rides are provided by volunteers and therefore may be        unable to accommodate wheel chairs. Their office hours are between 9:30 a.m.-3:30        p.m. Monday - Friday and there is no charge for this service.  Engineer, structural For medical rides in [place], [employer], operated by [employer] of [facility], provides rides for:  55 and over  Unable to drive and in need of transportation to scheduled medical appointments  Limit one ride per week.  Rides must be scheduled at least one week in advance and must occur between  8:30 a.m. and 5:00 p.m. Although there is no charge, donations are needed and accepted.  For more details and to schedule rides call Senior Wheels at [phone] in [place] and [place] and [phone] in the rest of [place].  Dial-a Lift In [facility], Dial-a-Lift [phone] provides rides for 60 and over and disabled individuals who are not able to ride the bus due to a physical or mental disability. An application must be submitted for certification. Riders must call the day before to schedule trips. Travel can be anywhere within the city limits of [place] for medical, employment, or recreational purposes.  They operate Monday through Friday 5:45 a.m.-6:30 p.m., Saturday 8:45 a.m.-5:15 p.m. and the fare is $1.50 per one-way trip (as of June 2004). For more details and to find out how to qualify call [phone].  [place] [employer] and Pharmacist, hospital) Transportation for medical and non-medical reasons (including trips to [employer]) for residents in [place] who live outside the  [place] or [place] city limits is available through [employer] and [employer]. In some cases, there is a charge for this service.  Services are provided between 6:00 a.m. and 9:00 p.m. Monday - Friday.  Reservations can be made between 8:00 a.m. and 5:00 p.m. Monday - Friday and        must be made by 12:00 noon on the working day before your trip. Call [phone] for an Eligibility Form or to schedule a trip. To view a full description of our services check [place] out at: [email or web address], building services Cancer Society Patient Services The American Cancer Society Patient Services' "Road to Recovery Program" [phone] provides rides for:  Individuals of all ages who need transportation to cancer treatment, including        chemotherapy and radiation treatments within [facility]  Clients must be able to walk on their own  Rides must be arranged 48 hours in advance  Because this is staffed by volunteers this is not a guaranteed ride Their hours of operation are 8:00a.m. - 5:00p.m. Monday -Friday. For more details and to find out how to qualify call [phone].  Banker (SCAT) [employer] Engineer, manufacturing) provides rides for individuals of any age who have disabilities and need transportation in [place].  Call [phone] to discuss eligibility and to arrange transportation.   Information included in this document is intended for guidance, education and information only  and was correct at the time of publication. It should not be seen as a diagnostic tool or a substitute for contact with professionals or agencies.  It was a pleasure seeing you today! Your health and satisfaction are our top priorities.  Glenetta Hew, MD  Your Providers PCP: [name], MD,  [phone]) Referring Provider: [name], MD,   [phone]) Care Team Provider: London Sheer, MD,  [phone]) Care Team Provider: Madelyn Brunner, [place],  [phone]) Care Team Provider: [place] Crane,  [phone]) Care Team Provider: [name], RN     NEXT STEPS: [x]  Early Intervention: Schedule sooner appointment, call our on-call services, or go to emergency room if there is any significant Increase in pain or discomfort New or worsening symptoms Sudden or severe changes in your health [x]  Flexible Follow-Up: We recommend a Return in about 1 month (around 05/19/2023) for chronic disease monitoring and management. for optimal routine care. This allows for progress monitoring and treatment adjustments. [x]  Preventive Care: Schedule your annual preventive care visit! It's typically covered by insurance and helps identify potential health issues early. [x]  Lab & X-ray Appointments: Incomplete tests scheduled today, or call to schedule. X-rays: [facility] (M-F, 8:30am-noon or 1pm-5pm). [x]  Medical Information Release: Sign a release form at front desk to obtain relevant medical information we don't have.  MAKING THE MOST OF OUR FOCUSED 20 MINUTE APPOINTMENTS: [x]   Clearly state your top concerns at the beginning of the visit to focus our discussion [x]   If you anticipate you will need more time, please inform the front desk during scheduling - we can book multiple appointments in the same week. [x]   If you have transportation problems- use our convenient video appointments or ask about transportation support. [x]   We can get down to business faster if you use MyChart to update information before the visit and submit non-urgent questions before your visit. Thank you for taking the time to provide details through MyChart.  Let our nurse know and she can import this information into your encounter documents.  Arrival and Wait Times: [x]   Arriving on time ensures that everyone receives prompt  attention. [x]   Early morning (8a) and afternoon (1p) appointments tend to have shortest wait times. [x]   Unfortunately, we cannot delay appointments for late arrivals or hold slots during phone calls.  Getting Answers and Following Up [x]   Simple Questions & Concerns: For quick questions or basic follow-up after your visit, reach [place] at [phone] or MyChart messaging. [x]   Complex Concerns: If your concern is more complex, scheduling an appointment might be best. Discuss this with the staff to find the most suitable option. [x]   Lab & Imaging Results: We'll contact you directly if results are abnormal or you don't use MyChart. Most normal results will be on MyChart within 2-3 business days, with a review message from Dr. Jon Billings. Haven't heard back in 2 weeks? Need results sooner? Contact us at [phone]. [x]   Referrals: Our referral coordinator will manage specialist referrals. The specialist's office should contact you within 2 weeks to schedule an appointment. Call us if you haven't heard from them after 2 weeks.  Staying Connected [x]   MyChart: Activate your MyChart for the fastest way to access results and message [place]. See the last page of this paperwork for instructions on how to activate.  Bring to Your Next Appointment [x]   Medications: Please bring all your medication bottles to your next appointment to ensure we  have an accurate record of your prescriptions. [x]   Health Diaries: If you're monitoring any health conditions at home, keeping a diary of your readings can be very helpful for discussions at your next appointment.  Billing [x]   X-ray & Lab Orders: These are billed by separate companies. Contact the invoicing company directly for questions or concerns. [x]   Visit Charges: Discuss any billing inquiries with our administrative services team.  Your Satisfaction Matters [x]   Share Your Experience: We strive for your satisfaction! If you have any complaints, or preferably  compliments, please let Dr. Jon Billings know directly or contact our Practice Administrators, Edwena Felty or [employer], by asking at the front desk.   Reviewing Your Records [x]   Review this early draft of your clinical encounter notes below and the final encounter summary tomorrow on MyChart after its been completed.  All orders placed so far are visible here: Chronic intractable pain Assessment & Plan: Refilled oxycodone since prescription from last week was not received.  This is bridging due to he was discharged from prior pain specialist associated with verbal conflict associated with insurance change to medicaid.  We've struggled to get him to pain specialist I think also due to insurance is not well accepted.    Orders: -     oxyCODONE-Acetaminophen; Take 1 tablet by mouth every 8 (eight) hours as needed for pain.  Dispense: 90 tablet; Refill: 0  Disabling back pain Assessment & Plan: I continue to support disability - but he has not been able to get it.  Finances limit his ability to get full care for this, and prevents working.  Orders: -     oxyCODONE-Acetaminophen; Take 1 tablet by mouth every 8 (eight) hours as needed for pain.  Dispense: 90 tablet; Refill: 0  Cervical spinal stenosis -     oxyCODONE-Acetaminophen; Take 1 tablet by mouth every 8 (eight) hours as needed for pain.  Dispense: 90 tablet; Refill: 0  Chronic kidney disease (CKD), active medical management without dialysis, stage 2 (mild) -     Urinalysis, Routine w reflex microscopic; Future -     Urine Culture; Future -     Urine cytology ancillary only; Future  Hematuria, unspecified type -     Urinalysis, Routine w reflex microscopic; Future -     Urine Culture; Future -     Urine cytology ancillary only; Future  Financial difficulties

## 2023-04-21 NOTE — Assessment & Plan Note (Signed)
 I continue to support disability - but he has not been able to get it.  Finances limit his ability to get full care for this, and prevents working.

## 2023-04-21 NOTE — Assessment & Plan Note (Signed)
 Spinal Stenosis Spinal stenosis is causing pain, leg weakness, and numbness. Surgery is being considered, with discussions on potential benefits and risks, including improved function but not life-changing outcomes, and the need to avoid heavy lifting post-surgery. Surgery may relieve pain by decompressing the spinal canal. Consult with a surgeon about surgery benefits and risks and avoid heavy lifting.
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
## 2023-04-21 NOTE — Assessment & Plan Note (Addendum)
 Refilled oxycodone since prescription from last week was not received.  This is bridging due to he was discharged from prior pain specialist associated with verbal conflict associated with insurance change to medicaid.  We've struggled to get him to pain specialist I think also due to insurance is not well accepted.    Chronic Pain Management Chronic spine pain is managed with Celebrex, Flagzoril, Cymbalta, gabapentin, and oxycodone. There was a delay in the oxycodone refill due to timing and insurance issues. Pain persists but is managed with stretching and medication. Emphasized the importance of daily Cymbalta intake for efficacy. Considering back surgery, with discussions on potential benefits and risks, including improved function but not life-changing outcomes, and the need to avoid heavy lifting post-surgery. Refill oxycodone prescription, ensure daily Cymbalta intake, discuss back surgery with the surgeon, and follow up in one month for medication management.

## 2023-04-21 NOTE — Assessment & Plan Note (Signed)
 Hematuria Hematuria raises concern for potential prostate cancer. The last PSA test nine months ago was normal, with no recent urological evaluation or treatment. Emphasized monitoring for prostate cancer due to the risk of metastasis to the spine. Obtain a urine sample for analysis and follow up with a urologist for further evaluation and management.

## 2023-04-21 NOTE — Assessment & Plan Note (Signed)
 See AVS information given We have refer care to social work

## 2023-04-22 ENCOUNTER — Telehealth: Payer: Self-pay

## 2023-04-22 ENCOUNTER — Other Ambulatory Visit (HOSPITAL_COMMUNITY): Payer: Self-pay

## 2023-04-22 ENCOUNTER — Other Ambulatory Visit: Payer: Self-pay | Admitting: Obstetrics and Gynecology
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
## 2023-04-22 NOTE — Patient Instructions (Signed)
 Visit Information  [name] was given information about Medicaid Managed Care team care coordination services as a part of their [facility] Medicaid benefit. [name] verbally consented to engagement with the [facility] Managed Care team.   If you are experiencing a medical emergency, please call 911 or report to your local emergency department or urgent care.   If you have a non-emergency medical problem during routine business hours, please contact your provider's office and ask to speak with a nurse.   For questions related to your [facility] health plan, please call: [phone] or go here:[email or web address]  If you would like to schedule transportation through your [facility] plan, please call the following number at least 2 days in advance of your appointment: [phone].   You can also use the MTM portal or MTM mobile app to manage your rides. Reimbursement for transportation is available through [facility]! For the portal, please go to [email or web address]  Call the [facility] Crisis Line at [phone], at any time, 24 hours a day, 7 days a week. If you are in danger or need immediate medical attention call 911.  If you would like help to quit smoking, call 1-800-QUIT-NOW (669-752-3145) OR Espaol: 1-855-Djelo-Ya (4-132-440-1027) o para ms informacin haga clic aqu or Text READY to 253-664 to register via text  [name] - following are the goals we discussed in your visit today:   Goals Addressed    Timeframe:  Long-Range Goal Priority:  High Start Date:   07/18/22                          Expected End Date:   ongoing                    Follow Up Date 06/03/23   - practice safe sex - schedule appointment for vaccines needed due to my age or health - schedule recommended health tests  - schedule and keep appointment for annual check-up    Why is this important?   Screening tests can find diseases early when they are easier to treat.  Your  doctor or nurse will talk with you about which tests are important for you.  Getting shots for common diseases like the flu and shingles will help prevent them.  04/22/23: Seen by PCP yesterday, completing paperwork for [facility]  Patient verbalizes understanding of instructions and care plan provided today and agrees to view in MyChart. Active MyChart status and patient understanding of how to access instructions and care plan via MyChart confirmed with patient.     The Managed Medicaid care management team will reach out to the patient again over the next 45 business  days.  The  Patient  has been provided with contact information for the Managed Medicaid care management team and has been advised to call with any health related questions or concerns.   [name] RN, BSN, [employer] [facility] RN Care Manager Direct Dial [phone]/GLO [phone] Website: [email or web address]   Following is a copy of your plan of care:  Care Plan : RN Care Manager Plan of Care  Updates made by [name], RN since 04/22/2023 12:00 AM     Problem: Health Promotion or Disease Self-Management (General Plan of Care)      Long-Range Goal: Chronic Disease Management and Care Coordination Needs   Start Date: 07/18/2022  Expected End Date: 06/18/2023  Priority: High  Note:   Current Barriers:  Knowledge Deficits related to plan of care for management of GERD, chronic LBP with right sciatica, osteoarthritis Care Coordination needs related to food and housing needs  Chronic Disease Management support and education needs related to GERD, chronic LBP with right sciatica, osteoarthritis  Financial Constraints  04/22/23: patient received resources from [facility].  Still needs to complete paperwork for [facility] before appt can be made.    RNCM Clinical Goal(s):  Patient will verbalize understanding of plan for management of GERD, chronic LBP with right sciatica,  osteoarthritis  as evidenced by patient report verbalize basic understanding of GERD, chronic LBP with right sciatica, osteoarthritis  disease process and self health management plan as evidenced by patient report take all medications exactly as prescribed and will call provider for medication related questions as evidenced by patient report demonstrate understanding of rationale for each prescribed medication as evidenced by patient report attend all scheduled medical appointments as evidenced by patient report and EMR review demonstrate ongoing adherence to prescribed treatment plan for GERD, chronic LBP with right sciatica, osteo as evidenced by patient report and EMR review work with Child psychotherapist to address  related to the management of food and housing needs related to the management of GERD, chronic LBP with right sciatica, osteoarthritis  as evidenced by review of EMR and patient or Child psychotherapist report through collaboration with Medical illustrator, provider, and care team.   Interventions: Pain Interventions:  (Status:  New goal.) Long Term Goal Pain assessment performed Medications reviewed Reviewed provider established plan for pain management Discussed importance of adherence to all scheduled medical appointments Counseled on the importance of reporting any/all new or changed pain symptoms or management strategies to pain management provider Advised patient to report to care team affect of pain on daily activities Assessed social determinant of health barriers  Inter-disciplinary care team collaboration (see longitudinal plan of care) Evaluation of current treatment plan related to  self management and patient's adherence to plan as established by provider 03/20/23:  [facility] referral placed by PCP.  SDOH Barriers (Status:  New goal. and Goal on track:  Yes.) Long Term Goal Patient interviewed and SDOH assessment performed        SDOH Interventions    Patient interviewed and  appropriate assessments performed Discussed plans with patient for ongoing care management follow up and provided patient with direct contact information for care management team Advised patient to contact provider regarding URO referral and back injections 03/20/23:  [facility] referral for any assistance with home improvements to help with power bill-insulation, windows.  Patient states he will contact [facility] as well.  Patient Goals/Self-Care Activities: Take all medications as prescribed Attend all scheduled provider appointments Call pharmacy for medication refills 3-7 days in advance of running out of medications Perform all self care activities independently  Perform IADL's (shopping, preparing meals, housekeeping, managing finances) independently Call provider office for new concerns or questions  Work with the social worker to address care coordination needs and will continue to work with the clinical team to address health care and disease management related needs Patient interviewed and appropriate assessments performed Discussed plans with patient for ongoing care management follow up and provided patient with direct contact information for care management team Advised patient to contact provider regarding URO referral and back injections-completed 03/20/23:  patient to contact  [facility] for any assistance with home improvements, power bill.  Follow Up Plan:  The patient has been provided with contact information for the care management  team and has been advised to call with any health related questions or concerns.  The care management team will reach out to the patient again over the next 45 business  days.

## 2023-04-22 NOTE — Telephone Encounter (Signed)
-----   Message from [facility] Tiffany L sent at 04/21/2023 12:26 PM EST ----- Regarding: PA for Oxycodone Please start PA for patient for oxycodone-acetaminophen (PERCOCET) 10-325 MG tablet. Thank you.

## 2023-04-22 NOTE — Telephone Encounter (Signed)
 Pharmacy Patient Advocate Encounter   Received notification from Physician's Office that prior authorization for oxyCODONE-Acetaminophen 10-325MG  tablets is required/requested.   Insurance verification completed.   The patient is insured through [facility] [place] .   Per test claim: PA required; PA submitted to above mentioned insurance via CoverMyMeds Key/confirmation #/EOC [id number] Status is pending

## 2023-04-23 ENCOUNTER — Other Ambulatory Visit (HOSPITAL_COMMUNITY): Payer: Self-pay

## 2023-04-23 NOTE — Telephone Encounter (Signed)
 Pharmacy Patient Advocate Encounter  Received notification from [facility] Medicaid that Prior Authorization for oxyCODONE-Acetaminophen 10-325MG  tablets  has been APPROVED from 04/22/23 to 10/19/23   PA #/Case ID/Reference #: [id number]

## 2023-04-25 NOTE — Telephone Encounter (Signed)
 Patient notified Rx oxyCODONE-Acetaminophen 10-325MG  tablets  has been APPROVED from 04/22/23 to 10/19/23

## 2023-04-30 ENCOUNTER — Other Ambulatory Visit: Payer: Self-pay

## 2023-04-30 NOTE — Patient Instructions (Signed)
Visit Information  Mr. Asman was given information about Medicaid Managed Care team care coordination services as a part of their [facility] Medicaid benefit. Delayne Petra verbally consented to engagement with the [facility] Managed Care team.   If you are experiencing a medical emergency, please call 911 or report to your local emergency department or urgent care.   If you have a non-emergency medical problem during routine business hours, please contact your provider's office and ask to speak with a nurse.   For questions related to your [facility] health plan, please call: [phone] or go here:[email or web address]  If you would like to schedule transportation through your [facility] plan, please call the following number at least 2 days in advance of your appointment: [phone].   You can also use the MTM portal or MTM mobile app to manage your rides. Reimbursement for transportation is available through [facility]! For the portal, please go to [email or web address]  Call the [facility] Crisis Line at [phone], at any time, 24 hours a day, 7 days a week. If you are in danger or need immediate medical attention call 911.  If you would like help to quit smoking, call 1-800-QUIT-NOW ((213)878-2653) OR Espaol: 1-855-Djelo-Ya (4-132-440-1027) o para ms informacin haga clic aqu or Text READY to 253-664 to register via text  Mr. Whittenberg - following are the goals we discussed in your visit today:   Goals Addressed   None     Social Worker will follow up in 30 days.  Gus Puma, Kenard Gower, MHA [facility]  Managed Medicaid Social Worker [phone]   Following is a copy of your plan of care:  There are no care plans that you recently modified to display for this patient.
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
## 2023-05-19 ENCOUNTER — Ambulatory Visit (INDEPENDENT_AMBULATORY_CARE_PROVIDER_SITE_OTHER): Payer: Medicaid Other | Admitting: Internal Medicine

## 2023-05-19 ENCOUNTER — Encounter: Payer: Self-pay | Admitting: Internal Medicine

## 2023-05-19 VITALS — BP 120/88 | HR 65 | Temp 98.0°F | Ht 72.0 in | Wt 196.0 lb

## 2023-05-19 DIAGNOSIS — M4802 Spinal stenosis, cervical region: Secondary | ICD-10-CM

## 2023-05-19 DIAGNOSIS — Z5941 Food insecurity: Secondary | ICD-10-CM

## 2023-05-19 DIAGNOSIS — R5383 Other fatigue: Secondary | ICD-10-CM | POA: Diagnosis not present

## 2023-05-19 DIAGNOSIS — G8929 Other chronic pain: Secondary | ICD-10-CM | POA: Diagnosis not present

## 2023-05-19 DIAGNOSIS — Z599 Problem related to housing and economic circumstances, unspecified: Secondary | ICD-10-CM

## 2023-05-19 DIAGNOSIS — K2100 Gastro-esophageal reflux disease with esophagitis, without bleeding: Secondary | ICD-10-CM

## 2023-05-19 DIAGNOSIS — M549 Dorsalgia, unspecified: Secondary | ICD-10-CM

## 2023-05-19 DIAGNOSIS — Z59811 Housing instability, housed, with risk of homelessness: Secondary | ICD-10-CM

## 2023-05-19 MED ORDER — OXYCODONE-ACETAMINOPHEN 10-325 MG PO TABS: 1.0000 | ORAL_TABLET | Freq: Three times a day (TID) | ORAL | 0 refills | Status: DC | PRN

## 2023-05-19 MED ORDER — PREDNISONE 20 MG PO TABS: ORAL_TABLET | ORAL | 0 refills | Status: DC

## 2023-05-19 MED ORDER — OMEPRAZOLE 20 MG PO CPDR: 20.0000 mg | DELAYED_RELEASE_CAPSULE | Freq: Every day | ORAL | 3 refills | Status: DC

## 2023-05-19 NOTE — Patient Instructions (Signed)
 PATIENT INSTRUCTIONS  Thank you for coming in today. Here is a summary of your visit and instructions to follow:  MEDICATIONS: 1. Omeprazole (Prilosec) 20mg  - Take 1 capsule by mouth daily to reduce stomach acid 2. Prednisone 20mg  - Take 2 tablets daily for 3 days, then 1 tablet daily for 4 days 3. Oxycodone-acetaminophen (Percocet) 10-325mg  - Take 1 tablet by mouth every 8 hours as needed for pain 4. Continue all your other current medications as prescribed  LABORATORY TESTS: The following tests have been ordered for you: - Testosterone level (morning test between 8-10 AM) - Complete blood count - Comprehensive metabolic panel - Vitamin D level - B12 and folate levels  Please complete these tests within the next two weeks.  DIETARY RECOMMENDATIONS: - Avoid spicy foods, acidic foods, and caffeine to reduce acid reflux - Avoid eating within 3 hours of bedtime - Stay well hydrated  ACTIVITY GUIDELINES: - Continue gentle movement and stretching as tolerated - Avoid heavy lifting and strenuous activities that worsen pain - Rest between activities if pain increases  FOLLOW-UP APPOINTMENTS: - Return to clinic in one month - Contact the pain clinic about your referral status if you haven't heard from them within two weeks  WARNING SIGNS: Call our office or seek immediate medical attention if you experience: - Severe worsening of pain not relieved by medication - Any new weakness, numbness, or loss of bladder/bowel control - Difficulty breathing, chest pain, or severe headache - Signs of infection including fever, chills, or unusual drainage  ADDITIONAL RESOURCES: - Community assistance programs information is attached - Local job assistance programs that work with individuals with felony records - Housing assistance programs contact information  QUESTIONS OR CONCERNS: Call our office at [employer] number] if you have any questions or concerns before your next appointment.
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
## 2023-05-19 NOTE — Assessment & Plan Note (Signed)
Working with social work.

## 2023-05-19 NOTE — Assessment & Plan Note (Signed)
 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 Previous imaging showed severe spinal stenosis at L4-5 with grade 1 anterolisthesis. Patient reports limited benefit from prior interventions including physical therapy and injections.    Plan:    - Renew oxycodone-acetaminophen 10-325mg , 1 tablet every 8 hours as needed for pain    - Prescribe prednisone 20mg  burst therapy: 2 tablets daily for 3 days, then 1 tablet daily for 4 days    - Continue current pain medications including celecoxib, cyclobenzaprine, duloxetine, and gabapentin    - Follow up on pain clinic referral status    - Emphasize importance of daily duloxetine for optimal efficacy

## 2023-05-19 NOTE — Assessment & Plan Note (Signed)
 Gastroesophageal reflux disease with esophagitis (K21.0)    Assessment: GERD symptoms have worsened recently, likely related to psychological stress from financial and personal challenges. Previous management with famotidine provides insufficient relief.    Plan:    - Add omeprazole 20mg  daily for more effective acid suppression    - Continue famotidine as needed for breakthrough symptoms    - Recommend dietary modifications including avoiding spicy foods and late evening meals    - Encourage stress reduction techniques

## 2023-05-19 NOTE — Assessment & Plan Note (Signed)
 Social determinants affecting health (Z59.9)    Assessment: Significant financial distress with unstable housing and lack of income severely impacting health status and treatment adherence capabilities. History as a class G felon limits access to benefits including food stamps.    Plan:    - Continue support for disability application    - Provide resources for food assistance programs that serve individuals with felony records    - Consider outreach to local housing assistance programs    - Schedule follow-up in one month to reassess social situation

## 2023-05-20 ENCOUNTER — Other Ambulatory Visit (HOSPITAL_COMMUNITY)
Admission: RE | Admit: 2023-05-20 | Discharge: 2023-05-20 | Disposition: A | Discharge status: Home / Self Care | Source: Ambulatory Visit | Attending: Internal Medicine | Admitting: Internal Medicine

## 2023-05-20 ENCOUNTER — Other Ambulatory Visit (INDEPENDENT_AMBULATORY_CARE_PROVIDER_SITE_OTHER)

## 2023-05-20 DIAGNOSIS — R5383 Other fatigue: Secondary | ICD-10-CM

## 2023-05-20 DIAGNOSIS — N182 Chronic kidney disease, stage 2 (mild): Secondary | ICD-10-CM | POA: Diagnosis present

## 2023-05-20 DIAGNOSIS — R319 Hematuria, unspecified: Secondary | ICD-10-CM

## 2023-05-20 DIAGNOSIS — Z599 Problem related to housing and economic circumstances, unspecified: Secondary | ICD-10-CM

## 2023-05-20 LAB — URINALYSIS, ROUTINE W REFLEX MICROSCOPIC
Bilirubin Urine: NEGATIVE
Ketones, ur: NEGATIVE
Leukocytes,Ua: NEGATIVE
Nitrite: NEGATIVE
Specific Gravity, Urine: 1.02 (ref 1.000–1.030)
Total Protein, Urine: NEGATIVE
Urine Glucose: NEGATIVE
Urobilinogen, UA: 0.2 (ref 0.0–1.0)
WBC, UA: NONE SEEN (ref 0–?)
pH: 7.5 (ref 5.0–8.0)

## 2023-05-20 LAB — TESTOSTERONE: Testosterone: 313.69 ng/dL (ref 300.00–890.00)

## 2023-05-21 ENCOUNTER — Other Ambulatory Visit (INDEPENDENT_AMBULATORY_CARE_PROVIDER_SITE_OTHER)

## 2023-05-21 DIAGNOSIS — Z599 Problem related to housing and economic circumstances, unspecified: Secondary | ICD-10-CM

## 2023-05-21 DIAGNOSIS — R5383 Other fatigue: Secondary | ICD-10-CM

## 2023-05-21 LAB — URINE CULTURE
MICRO NUMBER:: 16244794
SPECIMEN QUALITY:: ADEQUATE

## 2023-05-21 LAB — URINE CYTOLOGY ANCILLARY ONLY
Chlamydia: NEGATIVE
Comment: NEGATIVE
Comment: NEGATIVE
Comment: NORMAL
Neisseria Gonorrhea: NEGATIVE
Trichomonas: NEGATIVE

## 2023-05-22 ENCOUNTER — Encounter: Payer: Self-pay | Admitting: Internal Medicine

## 2023-05-22 DIAGNOSIS — R7989 Other specified abnormal findings of blood chemistry: Secondary | ICD-10-CM | POA: Insufficient documentation

## 2023-05-22 DIAGNOSIS — N182 Chronic kidney disease, stage 2 (mild): Secondary | ICD-10-CM | POA: Insufficient documentation

## 2023-05-22 HISTORY — DX: Chronic kidney disease, stage 2 (mild): N18.2

## 2023-05-22 LAB — TESTOSTERONE: Testosterone: 376 ng/dL (ref 300.00–890.00)

## 2023-05-22 NOTE — Progress Notes (Signed)
 Reviewed urinalysis and testosterone results from 05/20/2023. Key findings: Persistent trace hematuria without significant bacteriuria; testosterone level low-normal at 313.69 ng/dL. 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 Next steps: Schedule follow-up in 3 months for repeat testing; continue monitoring blood pressure; consider lifestyle modifications to support kidney health and natural testosterone production. See Patient Message for details.

## 2023-05-22 NOTE — Telephone Encounter (Signed)
 read by [name] at 3:51PM on 05/22/2023

## 2023-05-27 ENCOUNTER — Ambulatory Visit (INDEPENDENT_AMBULATORY_CARE_PROVIDER_SITE_OTHER): Admitting: Internal Medicine

## 2023-05-27 ENCOUNTER — Encounter: Payer: Self-pay | Admitting: Internal Medicine

## 2023-05-27 VITALS — BP 128/84 | HR 58 | Temp 98.1°F | Ht 72.0 in | Wt 191.2 lb

## 2023-05-27 DIAGNOSIS — G44019 Episodic cluster headache, not intractable: Secondary | ICD-10-CM

## 2023-05-27 DIAGNOSIS — E65 Localized adiposity: Secondary | ICD-10-CM

## 2023-05-27 DIAGNOSIS — G8929 Other chronic pain: Secondary | ICD-10-CM

## 2023-05-27 DIAGNOSIS — M4802 Spinal stenosis, cervical region: Secondary | ICD-10-CM | POA: Diagnosis not present

## 2023-05-27 DIAGNOSIS — N182 Chronic kidney disease, stage 2 (mild): Secondary | ICD-10-CM | POA: Diagnosis not present

## 2023-05-27 DIAGNOSIS — M5441 Lumbago with sciatica, right side: Secondary | ICD-10-CM | POA: Diagnosis not present

## 2023-05-27 DIAGNOSIS — Z59811 Housing instability, housed, with risk of homelessness: Secondary | ICD-10-CM

## 2023-05-27 DIAGNOSIS — R3121 Asymptomatic microscopic hematuria: Secondary | ICD-10-CM

## 2023-05-27 DIAGNOSIS — G43809 Other migraine, not intractable, without status migrainosus: Secondary | ICD-10-CM

## 2023-05-27 DIAGNOSIS — R7989 Other specified abnormal findings of blood chemistry: Secondary | ICD-10-CM

## 2023-05-27 LAB — COMPREHENSIVE METABOLIC PANEL WITH GFR
ALT: 17 U/L (ref 0–53)
AST: 18 U/L (ref 0–37)
Albumin: 4.4 g/dL (ref 3.5–5.2)
Alkaline Phosphatase: 74 U/L (ref 39–117)
BUN: 14 mg/dL (ref 6–23)
CO2: 28 meq/L (ref 19–32)
Calcium: 9.3 mg/dL (ref 8.4–10.5)
Chloride: 105 meq/L (ref 96–112)
Creatinine, Ser: 0.87 mg/dL (ref 0.40–1.50)
GFR: 96.13 mL/min (ref >60.00)
Glucose, Bld: 98 mg/dL (ref 70–99)
Potassium: 4 meq/L (ref 3.5–5.1)
Sodium: 139 meq/L (ref 135–145)
Total Bilirubin: 0.5 mg/dL (ref 0.2–1.2)
Total Protein: 7.5 g/dL (ref 6.0–8.3)

## 2023-05-27 LAB — LIPID PANEL
Cholesterol: 181 mg/dL (ref 0–200)
HDL: 39.9 mg/dL (ref >39.00)
LDL Cholesterol: 103 mg/dL — ABNORMAL HIGH (ref 0–99)
NonHDL: 141.42
Total CHOL/HDL Ratio: 5
Triglycerides: 194 mg/dL — ABNORMAL HIGH (ref 0.0–149.0)
VLDL: 38.8 mg/dL (ref 0.0–40.0)

## 2023-05-27 LAB — CBC WITH DIFFERENTIAL/PLATELET
Basophils Absolute: 0 10*3/uL (ref 0.0–0.1)
Basophils Relative: 0.3 % (ref 0.0–3.0)
Eosinophils Absolute: 0.1 10*3/uL (ref 0.0–0.7)
Eosinophils Relative: 1.6 % (ref 0.0–5.0)
HCT: 44.4 % (ref 39.0–52.0)
Hemoglobin: 14.9 g/dL (ref 13.0–17.0)
Lymphocytes Relative: 37.5 % (ref 12.0–46.0)
Lymphs Abs: 2.1 10*3/uL (ref 0.7–4.0)
MCHC: 33.5 g/dL (ref 30.0–36.0)
MCV: 94.8 fl (ref 78.0–100.0)
Monocytes Absolute: 0.5 10*3/uL (ref 0.1–1.0)
Monocytes Relative: 10 % (ref 3.0–12.0)
Neutro Abs: 2.8 10*3/uL (ref 1.4–7.7)
Neutrophils Relative %: 50.6 % (ref 43.0–77.0)
Platelets: 264 10*3/uL (ref 150.0–400.0)
RBC: 4.69 Mil/uL (ref 4.22–5.81)
RDW: 14.1 % (ref 11.5–15.5)
WBC: 5.5 10*3/uL (ref 4.0–10.5)

## 2023-05-27 LAB — VITAMIN D 25 HYDROXY (VIT D DEFICIENCY, FRACTURES): VITD: 19.56 ng/mL — ABNORMAL LOW (ref 30.00–100.00)

## 2023-05-27 MED ORDER — VITAMIN D3 125 MCG (5000 UT) PO CAPS: 5000.0000 [IU] | ORAL_CAPSULE | Freq: Every day | ORAL | 3 refills | Status: AC

## 2023-05-27 MED ORDER — DICLOFENAC SODIUM 1 % EX GEL: 4.0000 g | Freq: Four times a day (QID) | CUTANEOUS | 11 refills | Status: AC | PRN
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
## 2023-05-28 LAB — TSH RFX ON ABNORMAL TO FREE T4: TSH: 0.741 u[IU]/mL (ref 0.450–4.500)

## 2023-05-28 NOTE — Assessment & Plan Note (Signed)
##   Low-Normal Testosterone Level (E29.1) **Assessment:** Recent testosterone level measured at 313 ng/dL, which falls in the low-normal range. This is above the threshold for treatment (300 ng/dL). Patient had hoped testosterone might help with pain management and muscle strength.  **Plan:** - Discussed testosterone levels and treatment criteria with patient - Explained limited role of testosterone in pain management - Prescribe vitamin D 5000 IU daily to support bone health, as this would be a better target for intervention - Ordered vitamin D 25-OH level to assess baseline status - Ordered lipid panel and thyroid studies to evaluate for other metabolic factors

## 2023-05-28 NOTE — Assessment & Plan Note (Signed)
##   Persistent Microscopic Hematuria (R31.9) **Assessment:** Patient has longstanding microscopic hematuria documented on multiple urinalyses. Previous urology evaluation was incomplete. Recent urinalysis continues to show trace hematuria. Given family history of prostate cancer and lower urinary tract symptoms, comprehensive urologic evaluation remains important.  **Plan:** - Urinalysis with microscopy completed today showing persistent trace hematuria - Reestablish referral to urology The Center For Minimally Invasive Surgery urology team) - Provide patient with contact information for scheduling follow-up appointment - Emphasize importance of completing urologic evaluation to rule out significant pathology - PSA monitoring remains appropriate given family history of prostate cancer

## 2023-05-28 NOTE — Assessment & Plan Note (Signed)
**  Assessment:** Patient has stage 2 CKD with previously documented GFR of 83 mL/min/1.91m. History of extensive NSAID and acetaminophen use may have contributed to kidney impairment. Renal function testing ordered today to reassess current status.  **Plan:** - Complete comprehensive metabolic panel to reassess kidney function - Counsel regarding NSAID use and potential impact on kidney function - Advise adequate hydration, especially when using any pain medications - Monitor blood pressure for optimal renal protection - Consider RAAS inhibitor therapy if blood pressure remains elevated at future visits - Education provided on kidney-protective strategies

## 2023-05-28 NOTE — Assessment & Plan Note (Signed)
##   Financial Instability and Housing Insecurity (Z59.9, Z59.8) **Assessment:** Patient experiencing significant financial distress with overdue rent and utilities, creating imminent risk for homelessness. Reports no current income source with limited support network. Financial constraints directly impact ability to maintain healthcare appointments, medication adherence, and overall wellbeing.  **Plan:** - Coordinate with social worker (follow-up scheduled for May 8th) - Discussed importance of seeking immediate income sources - Provided counseling regarding community resources - Advised immediate follow-up if housing status changes - Documented medical necessity for stable housing and utilities due to multiple chronic conditions - Offered to provide medical documentation to support utility assistance applications

## 2023-05-28 NOTE — Assessment & Plan Note (Signed)
**  Assessment:** Patient continues to experience chronic bilateral low back pain with right-sided sciatica. Imaging has previously demonstrated severe spinal stenosis at L4-5 with grade 1 anterolisthesis. Pain significantly impacts functional status and employment capacity. Previous interventions including physical therapy and injections have provided limited benefit. Financial constraints have complicated comprehensive pain management.  **Plan:** - Continue current medication regimen: oxycodone-acetaminophen 10-325mg  q8h PRN, celecoxib 200mg  BID, cyclobenzaprine 10mg  TID PRN, duloxetine 60mg  daily, gabapentin 300mg  TID - Prescribe diclofenac sodium 1% gel for topical pain management - Recommend light home exercise program using weights and materials patient already has at home - Discuss conservative approaches to strengthen back muscles without exacerbating pain - Education provided regarding relationship between physical activity, muscle strengthening, and pain management - Continue support for pending pain clinic referral - Emphasize importance of consistent duloxetine use for optimal efficacy

## 2023-05-28 NOTE — Patient Instructions (Addendum)
 VISIT SUMMARY:  Today, we discussed your chronic back pain, kidney function, testosterone levels, and financial difficulties. We reviewed your current medications and lifestyle habits, and I provided recommendations for managing your health and improving your situation.  YOUR PLAN:  -CHRONIC BACK PAIN: Chronic back pain can limit your ability to work and perform daily activities. Since physical therapy is not an option due to insurance issues, you can use weights and exercise materials at home. Consider using Voltaren rubs for pain relief and explore light exercises to strengthen your back muscles. Acupuncture was also discussed, but it is not covered by your insurance.  -MICROSCOPIC HEMATURIA: Microscopic hematuria means there is blood in your urine that is not visible to the naked eye. This could be due to inflammation or other underlying conditions like prostate or bladder cancer. It is important to follow up with a urologist for further evaluation.  -CHRONIC KIDNEY DISEASE STAGE 2: Stage 2 chronic kidney disease means your kidneys are not functioning at full capacity. This could be due to dehydration or the use of NSAIDs (pain medications). We will reorder blood work to reassess your kidney function. It is important to limit NSAID use and stay hydrated to protect your kidneys.  -LOW-NORMAL TESTOSTERONE LEVEL: Your testosterone levels are at 313, which is considered low-normal. You do not qualify for testosterone treatment as your levels are above the threshold of 300. While testosterone treatment would not help with pain, it could increase muscle mass. We will prescribe vitamin D to support your bone health.  -FINANCIAL AND HOUSING INSTABILITY: You are facing financial difficulties and risk losing your housing and utilities. You are in contact with a Child psychotherapist and exploring financial assistance options. It is important to continue working with the Child psychotherapist for housing assistance and  consider strategies to obtain immediate cash, such as seeking employment.  -GENERAL HEALTH MAINTENANCE: You occasionally smoke Black & Mild cigars, which is harmful to your health. It is advisable to quit smoking to improve your overall health.  INSTRUCTIONS:  Please follow up with a urologist for your hematuria as soon as possible. Reorder and complete your blood work immediately to reassess your kidney function. Schedule a follow-up appointment in 30 days to review your opioid management. If you face eviction or become homeless, seek immediate follow-up care.    # Chronic Kidney Disease (CKD), Stage 2 (N18.2) - Updated 05/27/2023 #Trajectory.Stable  ## Current Status Patient with CKD stage 2, likely related to history of NSAID and acetaminophen overuse. Recent GFR 96.13 mL/min (05/27/2023) shows improvement from previous value of 83 mL/min (August 2024). No evidence of electrolyte abnormalities or acid-base disturbances. BP trending upward (128/84 today) which warrants monitoring given kidney disease. No history of proteinuria on urinalysis.  ## Key Clinical Data - Labs: Creatinine 0.87 mg/dL (40/98/1191), previous GFR 83 mL/min (August 2024) - Vitals: BP 128/84 (05/27/2023), trending up from 120/88 (05/19/2023) - Imaging: No renal imaging performed to date - Urinalysis: Shows trace hematuria but no proteinuria (05/20/2023) - Risk factors: History of analgesic overuse, hypertension  ## Management Strategy - Current: Conservative management with focus on medication review and lifestyle modifications - Avoidance of nephrotoxic medications when possible (limited NSAID use, appropriate acetaminophen dosing) - Hydration counseling particularly when using pain medications - BP monitoring with goal <130/80 given CKD diagnosis - No specific renal medications currently indicated  ## Recent Updates - Today's labs show improved GFR of 96.13 mL/min - Counseled on reducing NSAID burden by using  topical diclofenac as an  alternative - Vitamin D supplementation initiated today (deficiency identified on testing) - Prescribed diclofenac gel for topical pain management to reduce oral NSAID requirements  ## Interdisciplinary Input - Pain management: Working to balance pain control needs with renal protection - Social work: Addressing financial barriers that impact medication access - Primary care: Monitoring BP for optimal renal protection  ## Monitoring Plan - Labs: Repeat basic metabolic panel in 3 months - Vitals: BP monitoring with goal <130/80 - Urinalysis: Repeat in 3 months to monitor hematuria - Consider RAAS inhibitor therapy if BP remains elevated  ## Risk Assessment Current risk: Moderate - Improving GFR but persistent hypertension and difficulty adhering to management plan due to socioeconomic factors #RiskLevel.Moderate  ## Additional Considerations - #Management.Adjusting - Need to balance pain management with kidney protection - Social determinants significantly impact ability to optimally manage condition - Financial constraints affecting medication access and follow-up capability - Persistent hematuria requiring urologic evaluation

## 2023-06-03 ENCOUNTER — Other Ambulatory Visit: Payer: Self-pay

## 2023-06-03 ENCOUNTER — Encounter

## 2023-06-03 ENCOUNTER — Ambulatory Visit: Payer: Self-pay | Admitting: Obstetrics and Gynecology

## 2023-06-03 ENCOUNTER — Telehealth: Payer: Self-pay

## 2023-06-03 DIAGNOSIS — M48061 Spinal stenosis, lumbar region without neurogenic claudication: Secondary | ICD-10-CM

## 2023-06-03 DIAGNOSIS — G8929 Other chronic pain: Secondary | ICD-10-CM

## 2023-06-03 DIAGNOSIS — M4802 Spinal stenosis, cervical region: Secondary | ICD-10-CM

## 2023-06-04 NOTE — Addendum Note (Signed)
 Addended by: Alyse Low A on: 06/04/2023 03:11 PM   Modules accepted: Orders
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
## 2023-06-04 NOTE — Patient Instructions (Signed)
 Visit Information  Thank you for taking time to visit with me today. Please don't hesitate to contact me if I can be of assistance to you before our next scheduled telephone appointment.  Our next appointment is by telephone on June 17, 2023 at 1:30 pm.  Following is a copy of your care plan:   Goals Addressed             This Visit's Progress    VBCI RN Care Plan       Problems:  Chronic Disease Management support and education needs related to Cervical and Lumbar Spinal Stenosis Financial Constraints.  Goal: Over the next 3 months the Patient will continue to work with RN Care Manager and/or Social Worker to address care management and care coordination needs related to Cervical and Lumbar Spinal Stenosis as evidenced by adherence to CM Team Scheduled appointments      Interventions:   Coping strategies managing Spinal Stenosis  (Status:  New goal.)  Long Term Goal Evaluation of current treatment plan related to  Cervical and Lumbar Spinal Stenosis , Financial constraints related to inability to work and awaiting disability application decision due Sept 2025, Limited social support, and ADL IADL limitations self-management and patient's adherence to plan as established by provider. Discussed plans with patient for ongoing care management follow up and provided patient with direct contact information for care management team Evaluation of current treatment plan related to Spinal Stenosis and patient's adherence to plan as established by provider Social Work referral for financial challenges and exploring non-medication coping strategies tp deal with chronic intractable pain Discussed plans with patient for ongoing care management follow up and provided patient with direct contact information for care management team Screening for signs and symptoms of depression related to chronic disease state  Assessed social determinant of health barriers  Pain Interventions:  (Status:  New  goal.) Short Term Goal Pain assessment performed Medications reviewed Reviewed provider established plan for pain management Discussed importance of adherence to all scheduled medical appointments Counseled on the importance of reporting any/all new or changed pain symptoms or management strategies to pain management provider Advised patient to report to care team affect of pain on daily activities Discussed use of relaxation techniques and/or diversional activities to assist with pain reduction (distraction, imagery, relaxation, massage, acupressure, TENS, heat, and cold application Reviewed with patient prescribed pharmacological and nonpharmacological pain relief strategies Screening for signs and symptoms of depression related to chronic disease state  Assessed social determinant of health barriers  Patient Self-Care Activities:  Attend all scheduled provider appointments Call pharmacy for medication refills 3-7 days in advance of running out of medications Call provider office for new concerns or questions  Perform all self care activities independently  Take medications as prescribed   Work with the social worker to address care coordination needs and will continue to work with the clinical team to address health care and disease management related needs  Plan:  The patient has been provided with contact information for the care management team and has been advised to call with any health related questions or concerns.              Patient verbalizes understanding of instructions and care plan provided today and agrees to view in MyChart. Active MyChart status and patient understanding of how to access instructions and care plan via MyChart confirmed with patient.     The patient has been provided with contact information for the care management team and  has been advised to call with any health related questions or concerns.   Please call the care guide team at [phone]  if you need to cancel or reschedule your appointment.   Please call 1-800-273-TALK (toll free, 24 hour hotline) if you are experiencing a Mental Health or Behavioral Health Crisis or need someone to talk to.  Jarvis Sawa A. Mliss Fritz RN, BA, [facility], CRRN [place]  [facility] Population Health RN Care Manager Direct Dial: [phone]  Fax: [phone]

## 2023-06-06 ENCOUNTER — Telehealth: Payer: Self-pay | Admitting: *Deleted

## 2023-06-06 NOTE — Progress Notes (Signed)
 Complex Care Management Note  Care Guide Note 06/06/2023 Name: Dimetri Armitage MRN: [id number] DOB: 1966/01/02  Arrin Pintor is a 58 y.o. year old male who sees [name], MD for primary care. I reached out to [name] by phone today to offer complex care management services.  Mr. Winship was given information about Complex Care Management services today including:   The Complex Care Management services include support from the care team which includes your Nurse Care Manager, Clinical Social Worker, or Pharmacist.  The Complex Care Management team is here to help remove barriers to the health concerns and goals most important to you. Complex Care Management services are voluntary, and the patient may decline or stop services at any time by request to their care team member.   Complex Care Management Consent Status: Patient agreed to services and verbal consent obtained.   Follow up plan:  Telephone appointment with complex care management team member scheduled for:  06/09/2023  Encounter Outcome:  Patient Scheduled  Burman Nieves, CMA [place]  [facility], [facility] Guide Direct Dial: [phone]  Fax: [phone] Website: [email or web address]

## 2023-06-09 ENCOUNTER — Other Ambulatory Visit: Payer: Self-pay | Admitting: Licensed Clinical Social Worker

## 2023-06-09 NOTE — Patient Instructions (Signed)
 Visit Information  Thank you for taking time to visit with me today. Please don't hesitate to contact me if I can be of assistance to you before our next scheduled appointment.  Our next appointment is by telephone on 07/09/2023. Please call the care guide team at [phone] if you need to cancel or reschedule your appointment.   Following is a copy of your care plan:   Goals Addressed             This Visit's Progress    VBCI Social Work Care Plan       Problems:   Depression  , Corporate treasurer , Geophysicist/field seismologist , Housing , and Stress  CSW Clinical Goal(s):   Over the next 4 weeks the Patient will attend all scheduled medical appointments as evidenced by patient report and care team review of appointment completion in EMR: follow up appt with PCP and RN CCM demonstrate a reduction in symptoms related to Depression: depressed mood  explore community resource options for unmet needs related to [employer] , Geophysicist/field seismologist , Housing , and Transportation.  Interventions:  Social Determinants of Health in Patient with Depression: depressed mood: SDOH assessments completed: Corporate treasurer , Geophysicist/field seismologist , Housing , and Transportation Evaluation of current treatment plan related to unmet needs Housing resources: Mental Health:  Evaluation of current treatment plan related to Depression: depressed mood Active listening / Reflection utilized Depression screen reviewed Emotional Support Provided Motivational Interviewing employed PHQ2/PHQ9 completed Problem Solving /Task Center strategies reviewed Provided general psycho-education for mental health needs  Patient Goals/Self-Care Activities:  Follow up with community resources list for community food options.  Plan:   Telephone follow up appointment with care management team member scheduled for:  07/09/2023        Please call the Suicide and Crisis Lifeline: 988 if you are experiencing a Mental Health or  Behavioral Health Crisis or need someone to talk to.  Patient verbalizes understanding of instructions and care plan provided today and agrees to view in MyChart. Active MyChart status and patient understanding of how to access instructions and care plan via MyChart confirmed with patient.     Hale Level, LCSW [place], [facility] Licensed Clinical Social Worker Care Coordinator [phone]
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
## 2023-06-17 ENCOUNTER — Other Ambulatory Visit: Payer: Self-pay

## 2023-06-18 NOTE — Patient Instructions (Signed)
 Visit Information  Thank you for taking time to visit with me today. Please don't hesitate to contact me if I can be of assistance to you before our next scheduled telephone appointment.  Our next appointment is by telephone on 07/10/23 at 2pm.  Following is a copy of your care plan:   Goals Addressed             This Visit's Progress    VBCI RN Care Plan       Problems:  Chronic Disease Management support and education needs related to Cervical and Lumbar Spinal Stenosis Financial Constraints.  Goal: Over the next 3 months the Patient will continue to work with RN Care Manager and/or Social Worker to address care management and care coordination needs related to Cervical and Lumbar Spinal Stenosis as evidenced by adherence to CM Team Scheduled appointments      Interventions:   Coping strategies managing Spinal Stenosis  (Status:  Ongoing)  Long Term Goal Evaluation of current treatment plan related to  Cervical and Lumbar Spinal Stenosis , Financial constraints related to inability to work and awaiting disability application decision due Sept 2025, Limited social support, and ADL IADL limitations self-management and patient's adherence to plan as established by provider. Discussed plans with patient for ongoing care management follow up and provided patient with direct contact information for care management team Evaluation of current treatment plan related to Spinal Stenosis and patient's adherence to plan as established by provider Social Work referral for financial challenges and exploring non-medication coping strategies tp deal with chronic intractable pain Discussed plans with patient for ongoing care management follow up and provided patient with direct contact information for care management team Screening for signs and symptoms of depression related to chronic disease state  Assessed social determinant of health barriers  Pain Interventions:  (Status:  New goal.) Short Term  Goal Pain assessment performed Medications reviewed Reviewed provider established plan for pain management Discussed importance of adherence to all scheduled medical appointments Counseled on the importance of reporting any/all new or changed pain symptoms or management strategies to pain management provider Advised patient to report to care team affect of pain on daily activities Discussed use of relaxation techniques and/or diversional activities to assist with pain reduction (distraction, imagery, relaxation, massage, acupressure, TENS, heat, and cold application Reviewed with patient prescribed pharmacological and nonpharmacological pain relief strategies Screening for signs and symptoms of depression related to chronic disease state  Assessed social determinant of health barriers  Patient Self-Care Activities:  Attend all scheduled provider appointments Call pharmacy for medication refills 3-7 days in advance of running out of medications Call provider office for new concerns or questions  Perform all self care activities independently  Take medications as prescribed   Work with the social worker to address care coordination needs and will continue to work with the clinical team to address health care and disease management related needs  Plan:  The patient has been provided with contact information for the care management team and has been advised to call with any health related questions or concerns.              Patient verbalizes understanding of instructions and care plan provided today and agrees to view in MyChart. Active MyChart status and patient understanding of how to access instructions and care plan via MyChart confirmed with patient.     The patient has been provided with contact information for the care management team and has been advised to  call with any health related questions or concerns.   Please call the care guide team at [phone] if you need to  cancel or reschedule your appointment.   Please call 1-800-273-TALK (toll free, 24 hour hotline) if you are experiencing a Mental Health or Behavioral Health Crisis or need someone to talk to.  Bryan Valdez A. Saverio Curling RN, BA, [facility], CRRN [place]  [facility] Population Health RN Care Manager Direct Dial: [phone]  Fax: [phone]
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
## 2023-06-19 ENCOUNTER — Ambulatory Visit: Admitting: Internal Medicine

## 2023-06-26 ENCOUNTER — Other Ambulatory Visit (HOSPITAL_COMMUNITY)
Admission: RE | Admit: 2023-06-26 | Discharge: 2023-06-26 | Disposition: A | Discharge status: Home / Self Care | Source: Ambulatory Visit | Attending: Internal Medicine | Admitting: Internal Medicine

## 2023-06-26 ENCOUNTER — Encounter: Payer: Self-pay | Admitting: Internal Medicine

## 2023-06-26 ENCOUNTER — Ambulatory Visit (INDEPENDENT_AMBULATORY_CARE_PROVIDER_SITE_OTHER): Admitting: Internal Medicine

## 2023-06-26 VITALS — BP 118/66 | HR 66 | Temp 98.0°F | Ht 72.0 in | Wt 190.2 lb

## 2023-06-26 DIAGNOSIS — E559 Vitamin D deficiency, unspecified: Secondary | ICD-10-CM

## 2023-06-26 DIAGNOSIS — R3129 Other microscopic hematuria: Secondary | ICD-10-CM | POA: Diagnosis present

## 2023-06-26 DIAGNOSIS — Z5689 Other problems related to employment: Secondary | ICD-10-CM

## 2023-06-26 DIAGNOSIS — M4802 Spinal stenosis, cervical region: Secondary | ICD-10-CM | POA: Diagnosis not present

## 2023-06-26 DIAGNOSIS — F1129 Opioid dependence with unspecified opioid-induced disorder: Secondary | ICD-10-CM | POA: Diagnosis not present

## 2023-06-26 DIAGNOSIS — G8929 Other chronic pain: Secondary | ICD-10-CM

## 2023-06-26 DIAGNOSIS — M5441 Lumbago with sciatica, right side: Secondary | ICD-10-CM | POA: Diagnosis not present

## 2023-06-26 DIAGNOSIS — Z79899 Other long term (current) drug therapy: Secondary | ICD-10-CM

## 2023-06-26 DIAGNOSIS — Z59811 Housing instability, housed, with risk of homelessness: Secondary | ICD-10-CM

## 2023-06-26 DIAGNOSIS — M549 Dorsalgia, unspecified: Secondary | ICD-10-CM | POA: Diagnosis not present

## 2023-06-26 MED ORDER — OXYCODONE-ACETAMINOPHEN 10-325 MG PO TABS: 1.0000 | ORAL_TABLET | Freq: Three times a day (TID) | ORAL | 0 refills | Status: DC | PRN
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
## 2023-06-27 ENCOUNTER — Telehealth: Payer: Self-pay

## 2023-06-27 ENCOUNTER — Telehealth: Payer: Self-pay | Admitting: Physical Medicine and Rehabilitation

## 2023-06-27 ENCOUNTER — Telehealth: Payer: Self-pay | Admitting: Orthopedic Surgery

## 2023-06-27 ENCOUNTER — Encounter: Payer: Self-pay | Admitting: Internal Medicine

## 2023-06-27 ENCOUNTER — Other Ambulatory Visit: Payer: Self-pay | Admitting: Physical Medicine and Rehabilitation

## 2023-06-27 DIAGNOSIS — M545 Low back pain, unspecified: Secondary | ICD-10-CM

## 2023-06-27 LAB — URINALYSIS W MICROSCOPIC + REFLEX CULTURE
Bacteria, UA: NONE SEEN /HPF
Bilirubin Urine: NEGATIVE
Glucose, UA: NEGATIVE
Hyaline Cast: NONE SEEN /LPF
Ketones, ur: NEGATIVE
Leukocyte Esterase: NEGATIVE
Nitrites, Initial: NEGATIVE
Protein, ur: NEGATIVE
Specific Gravity, Urine: 1.027 (ref 1.001–1.035)
WBC, UA: NONE SEEN /HPF (ref 0–5)
pH: 5.5 (ref 5.0–8.0)

## 2023-06-27 LAB — URINE CYTOLOGY ANCILLARY ONLY
Chlamydia: NEGATIVE
Comment: NEGATIVE
Comment: NEGATIVE
Comment: NORMAL
Neisseria Gonorrhea: NEGATIVE
Trichomonas: NEGATIVE

## 2023-06-27 LAB — NO CULTURE INDICATED

## 2023-06-27 NOTE — Assessment & Plan Note (Addendum)
 Cervical spinal stenosis with radiculopathy (Secondary diagnosis supporting disability) Further limits occupational capacity Working with referral coordinator for specialty care Following with neurosurgeon still.

## 2023-06-27 NOTE — Assessment & Plan Note (Signed)
 He has persistent asymptomatic microscopic hematuria with intermittent UTIs. Previous urinalysis showed blood and mucus but no infection. The differential includes urinary stones or other urinary tract issues. Increased water intake is recommended to help flush the urinary tract. Order urinalysis to check for blood and infection. Encourage increased water intake. Consider a urology referral for further evaluation if hematuria persists.

## 2023-06-27 NOTE — Assessment & Plan Note (Signed)
 Disabling chronic low back pain with right-sided sciatica (Primary diagnosis supporting disability claim)  Patient demonstrates objective findings consistent with his reported pain and functional limitations Condition has proven refractory to conservative management Pain significantly impacts ability to perform any substantial gainful activity Physical limitations prevent both manual labor and sedentary work requiring prolonged sitting Continue oxycodone -acetaminophen  as prescribed for pain management Implement strict monitoring with monthly pill counts Despite adequate pain medication, patient remains significantly functionally limited Work with referral coordinator to establish pain management specialist accepting Medicaid

## 2023-06-27 NOTE — Telephone Encounter (Signed)
 Last injection 12/24 % 50 of relief and function ability Duration of relief/improvement 3 months Current pain score-10 Recent falls or injuries--None Location of pain--Lower back bilateral legs

## 2023-06-27 NOTE — Assessment & Plan Note (Addendum)
 Housing instability with imminent risk of homelessness  Direct consequence of inability to maintain employment due to disability Social determinant of health requiring intervention Social work coordination in progress SSDI application is medically indicated and appropriate given patient's demonstrated inability to maintain gainful employment despite efforts to do so

## 2023-06-27 NOTE — Assessment & Plan Note (Addendum)
 He experiences chronic bilateral low back pain with right-sided sciatica, worsened by physical labor. The severe pain limits his work capacity, requiring frequent breaks. Insurance issues delay interventions like injections. Current management involves narcotic analgesics, with concerns about dependency, but they are necessary to maintain work ability and prevent homelessness. He is aware of the risks and instructed on proper use to prevent misuse. Continue oxycodone -acetaminophen  as prescribed. Send pill bottle photos with remaining pills around the 20th of the month for compliance monitoring. Submit a referral to locate a pain specialist accepting Medicaid. Draft a letter for a disability lawyer to support his claim. Coordinate with social workers and VBCI nurse care plan for additional support.  Action plan to support disability application: Document functional limitations in detail Prepare comprehensive medical summary letter for disability determination Coordinate with legal aid for SSDI application support Continue to document failed work attempts and physical limitations Schedule follow-up in 2 weeks to monitor status and continue documentation supporting disability claim  This patient clearly meets medical criteria for disability benefits based on objective findings, documented functional limitations, and inability to maintain gainful employment despite reasonable efforts to do so.  I strongly support his claim for disability

## 2023-06-27 NOTE — Assessment & Plan Note (Signed)
 Patient was kept on chronic opioid pain management for disabling back pain, but was not able to work and lost insurance and switched to medicaid, at which point he has struggled to maintain pain management specialist.  I continue to bridge medication until this can be taken over.

## 2023-06-27 NOTE — Telephone Encounter (Signed)
 Pt request a sooner appt for bil knee injection. Appointment scheduled for 07/30/23 @ 9:15am

## 2023-06-27 NOTE — Assessment & Plan Note (Deleted)
 Working with referral coordinator to get in to  pain management

## 2023-06-27 NOTE — Telephone Encounter (Signed)
 Pt request appointment for another back injection

## 2023-06-28 NOTE — Patient Instructions (Signed)
 AFTER VISIT SUMMARY Your Guide to Social Security Disability Benefits During today's visit, we discussed your health conditions and how they prevent you from working. We are supporting your application for Social Security Disability Insurance (SSDI) benefits. IMPORTANT: We have completed medical forms for your disability application. These forms show that you cannot work because of your back problems, neck problems, and chronic pain.  WHAT IS SSDI? [employer], [employer] (SSDI) is money paid by the government to people who: Worked enough in the past and paid Social Security taxes Have a medical condition that stops them from working [employer] do their past job or any other type of job Have been or will be disabled for at least 12 months If approved, you may receive: Monthly payments (usually between $800 and $1,800) Medicare coverage (after 24 months of disability) Back pay for the time since you became disabled STEPS TO APPLY FOR SSDI 1 Apply as soon as possible  You can apply in three ways: Online at [email or web address] By phone: [phone] In person at your local Social Security office 2 Gather all your medical information  Make a list of: All doctors who have treated you (names, addresses, phone numbers) Hospital visits and stays All medications you take Medical tests you've had (like MRIs, X-rays) 3 Fill out all forms completely and honestly  Focus on your worst days, not your average days Be specific about your pain and limitations Explain how your conditions affect your daily life Don't downplay your symptoms or limitations 4 Keep copies of everything  Make copies of all forms before you send them Keep a file with all your medical records Write down the dates and details of all communications 5 Be prepared for a long process  Initial decisions usually take 3-5 months Many applications are denied at first If denied, you can appeal (most people win  on appeal) The entire process can take 1-2 years DOCUMENTS YOU NEED TO COMPLETE 1. Daily Activities Questionnaire Write down everything you do in a typical day, including: How long you can sit, stand, or walk before needing to rest Any help you need with personal care (bathing, dressing, etc.) Household chores you can no longer do How your condition affects your sleep How often you have severe pain episodes TIP: Keep a pain diary for the next two weeks. Write down: When pain occurs How bad it is (rate 1-10) What makes it worse What (if anything) helps How it affects your activities 2. Ask Someone Close to You to Write a Statement A family member, friend, or caregiver should write about: What activities they've seen you struggle with How your abilities have changed over time What help they provide to you How often they see you in pain 3. Work History Report For each job you've had in the last 15 years, write down: Job title and duties How many hours you worked How much you lifted How much you walked, stood, or sat Why you can't do that job now SOCIAL SECURITY EXAMS Social Security might send you to a doctor for an exam. This is normal. If this happens: Attend the appointment - missing it can cause your claim to be denied Be honest about your symptoms and limitations Don't exaggerate, but don't minimize your problems Explain how your condition affects your daily activities The exam may be short (15-20 minutes) - that's normal IF YOUR APPLICATION IS DENIED About 70% of applications are denied at first. Don't give up! If denied, follow these steps: File an  appeal within 60 days Consider getting help from a lawyer (they only get paid if you win) Keep all medical appointments Get any new medical records Update Social Security about any changes in your condition WHILE YOU WAIT FOR A DECISION Get Help With Living Expenses Type of Help Where to Apply  Food assistance (SNAP/food  stamps) Local Department of Social Services  Housing assistance Local Housing Authority  Utility bill help [employer] Action Agency  Free or low-cost medications Ask our office about patient assistance programs  Legal help [facility] [place]  9080921009)  Keep Getting Medical Care Attend all medical appointments Tell your doctors about all symptoms Follow treatment recommendations when possible If you can't afford a treatment, tell your doctor why Keep a list of all medications you take ORGANIZATIONS THAT CAN HELP Legal Help [facility] [place]  4257104130 Free legal help for low-income people [employer] Disability Rights 3056865113 Advocates for people with disabilities Disability Advocacy Groups National Organization of Social Security Claimants' Representatives (239)153-5825 Can help find a disability lawyer [employer] ([phone])[phone] Food pantry, emergency assistance [employer] [facility] [place] [phone] Utility assistance, food [employer] 211 Dial 2-1-1 Information and referrals to many services OUR OFFICE CONTACT If you have questions about your disability application or need more documentation: Dr. Markham Silence Office: [phone] or submit MyChart questions. We are here to help with your disability application. Please let us  know if you need any additional documentation or have questions about your medical conditions. Your next appointment:  Future Appointments  Date Time Provider Department Center  07/09/2023 10:00 AM Jens Molder, LCSW [facility] None  07/10/2023  2:00 PM Randye Buttner, RN [facility] None  07/28/2023  8:40 AM Anthon Kins, MD [facility]  07/30/2023  9:15 AM Diedra Fowler, MD [facility] None  Remember: It's important to keep all medical appointments and continue treatment while your disability application is being processed.

## 2023-06-30 ENCOUNTER — Telehealth: Payer: Self-pay

## 2023-06-30 NOTE — Telephone Encounter (Signed)
 Spoke with pt via phone to come and pick up two letters PCP has ready for him placed in the front office for him to pick up.

## 2023-06-30 NOTE — Telephone Encounter (Signed)
 Pt states he has a telephone appt with Social Securtiy on 07/09/23 at 2:30 pm & is needing to have an appt with [facility] at the same time. He states they need to speak with him also. No available time for this appt.

## 2023-07-01 ENCOUNTER — Telehealth: Payer: Self-pay

## 2023-07-01 LAB — DRUG SCREEN, 5 PANEL, UR
Amphetamines, Urine: NEGATIVE ng/mL
Cannabinoid Quant, Ur: POSITIVE — AB
Cocaine (Metab.): NEGATIVE ng/mL
OPIATE QUANTITATIVE URINE: NEGATIVE ng/mL
PCP Quant, Ur: NEGATIVE ng/mL

## 2023-07-01 NOTE — Telephone Encounter (Signed)
     Tried to call pt back on behalf of wanting a appt time at the same time as Tree [facility]. To let pt know we are not able to do this. Was not able to leave message.  Laurier Poplin, RMA

## 2023-07-02 NOTE — Telephone Encounter (Signed)
 I will keep looking for a sooner appt.

## 2023-07-04 ENCOUNTER — Encounter: Payer: Self-pay | Admitting: Orthopedic Surgery

## 2023-07-08 NOTE — Telephone Encounter (Signed)
 I called and scheduled for 07/10/23 @ 9 for his knee injections, he asked about his back injections, I advised that it is pending approval with the insurance

## 2023-07-09 ENCOUNTER — Other Ambulatory Visit: Payer: Self-pay | Admitting: Licensed Clinical Social Worker
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
## 2023-07-10 ENCOUNTER — Other Ambulatory Visit: Payer: Self-pay

## 2023-07-10 ENCOUNTER — Ambulatory Visit: Admitting: Orthopedic Surgery

## 2023-07-10 DIAGNOSIS — M170 Bilateral primary osteoarthritis of knee: Secondary | ICD-10-CM | POA: Diagnosis not present

## 2023-07-10 DIAGNOSIS — M1712 Unilateral primary osteoarthritis, left knee: Secondary | ICD-10-CM

## 2023-07-10 DIAGNOSIS — M47816 Spondylosis without myelopathy or radiculopathy, lumbar region: Secondary | ICD-10-CM | POA: Diagnosis not present

## 2023-07-10 DIAGNOSIS — M1711 Unilateral primary osteoarthritis, right knee: Secondary | ICD-10-CM

## 2023-07-10 NOTE — Addendum Note (Signed)
 Addended by: Colette Davies on: 07/10/2023 01:01 PM   Modules accepted: Orders
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
## 2023-07-10 NOTE — Patient Instructions (Signed)
 Visit Information  Thank you for taking time to visit with me today. Please don't hesitate to contact me if I can be of assistance to you before our next scheduled telephone appointment.  Our next appointment is by telephone on 08/07/23 at 1pm  Following is a copy of your care plan:   Goals Addressed             This Visit's Progress    VBCI RN Care Plan       Problems:  Chronic Disease Management support and education needs related to Cervical and Lumbar Spinal Stenosis Financial Constraints.  Goal: Over the next 3 months the Patient will continue to work with RN Care Manager and/or Social Worker to address care management and care coordination needs related to Cervical and Lumbar Spinal Stenosis as evidenced by adherence to CM Team Scheduled appointments      Interventions:   Coping strategies managing Spinal Stenosis  (Status:  Ongoing)  Long Term Goal Evaluation of current treatment plan related to Cervical and Lumbar Spinal Stenosis, Financial constraints related to inability to work and awaiting disability application decision due Sept 2025, Limited social support, and ADL IADL limitations self-management and patient's adherence to plan as established by provider. Discussed plans with patient for ongoing care management follow up and provided patient with direct contact information for care management team Evaluation of current treatment plan related to Spinal Stenosis and patient's adherence to plan as established by provider Social Work referral for financial challenges and exploring non-medication coping strategies tp deal with chronic intractable pain Discussed plans with patient for ongoing care management follow up and provided patient with direct contact information for care management team Screening for signs and symptoms of depression related to chronic disease state  Assessed social determinant of health barriers  Pain Interventions:  (Status:  Ongoing) Short Term  Goal Pain assessment performed Medications reviewed Reviewed provider established plan for pain management Discussed importance of adherence to all scheduled medical appointments Counseled on the importance of reporting any/all new or changed pain symptoms or management strategies to pain management provider Advised patient to report to care team affect of pain on daily activities Discussed use of relaxation techniques and/or diversional activities to assist with pain reduction (distraction, imagery, relaxation, massage, acupressure, TENS, heat, and cold application Reviewed with patient prescribed pharmacological and nonpharmacological pain relief strategies Screening for signs and symptoms of depression related to chronic disease state  Assessed social determinant of health barriers  Patient Self-Care Activities:  Attend all scheduled provider appointments Call pharmacy for medication refills 3-7 days in advance of running out of medications Call provider office for new concerns or questions  Perform all self care activities independently  Take medications as prescribed   Work with the social worker to address care coordination needs and will continue to work with the clinical team to address health care and disease management related needs  Plan:  The patient has been provided with contact information for the care management team and has been advised to call with any health related questions or concerns.              Patient verbalizes understanding of instructions and care plan provided today and agrees to view in MyChart. Active MyChart status and patient understanding of how to access instructions and care plan via MyChart confirmed with patient.     The patient has been provided with contact information for the care management team and has been advised to call with any  health related questions or concerns.   Please call the care guide team at [phone] if you need to  cancel or reschedule your appointment.   Please call 1-800-273-TALK (toll free, 24 hour hotline) if you are experiencing a Mental Health or Behavioral Health Crisis or need someone to talk to.  Bryan Valdez A. Saverio Curling RN, BA, [facility], CRRN [place]  [facility] Population Health RN Care Manager Direct Dial: [phone]  Fax: [phone]

## 2023-07-10 NOTE — Progress Notes (Signed)
 Orthopedic Spine Surgery Office Note   Assessment: Patient is a 58 y.o. male with chronic low back pain.  Pain is localized to the lower lumbar spine.  Periodically has radiating right leg pain but his most consistent symptom is lower lumbar pain.  He is previously responded to facet injections     Plan: - No operative plans at this time -In regards to his back since his main symptom is in the lumbar spine and he has previously responded to facet injections, I provided him with a another referral for those injections -Was interested in repeat knee injection since they have been providing him with about 5 months of relief.  This was done today in the office.  See procedure note below -Patient should return to office on an as-needed basis     Bilateral knee injection note: After discussing the risks, benefits, and alternatives of bilateral intra-articular knee injections, patient elected to proceed.  Patient was in the seated position with the left knee at 90 degrees.  The anterior lateral soft spot was prepped with an alcohol based prep.  Ethyl chloride was used to anesthetize the skin.  A 20-gauge needle was used to inject 1 cc of lidocaine , 1 cc of bupivacaine, 1 cc of Depo-Medrol  under standard sterile technique.  Needle was withdrawn and Band-Aid was applied.  The same procedure was then repeated on the right knee.  Patient tolerated the procedure well.   ___________________________________________________________________________     History:   Patient is a 58 y.o. male who presents today for 2 issues.  His first issue is bilateral knee pain.  He has been previously seen in the office for this.  He got injections about 5 months ago into the knees and noticed significant relief with that treatment.  He said the injections recently wore off and he has had return of knee pain.  He said he got about 5 months of significant relief with those.  He also comes in today to talk about his low back.  He  is having pain in the lower lumbar region.  He sometimes gets some pain rating down the posterior aspect of his right leg but not consistently.  His main issue is the lumbar back pain.  He is previously gotten facet injections with Dr. Daisey Dryer and noted about 70% relief that lasted for 4 to 5 months.  He currently rates his pain as a 7 out of 10 in the low back.  No bowel or bladder incontinence.  No saddle anesthesia.  No weakness noted in the lower extremities.    Physical Exam:   General: no acute distress, appears stated age Neurologic: alert, answering questions appropriately, following commands Respiratory: unlabored breathing on room air, symmetric chest rise Psychiatric: appropriate affect, normal cadence to speech     MSK (spine):   -Strength exam                                                   Left                  Right EHL                              5/5  5/5 TA                                 5/5                  5/5 GSC                             5/5                  5/5 Knee extension            5/5                  5/5 Hip flexion                    5/5                  5/5   -Sensory exam                           Sensation intact to light touch in L3-S1 nerve distributions of bilateral lower extremities   Imaging: None obtained at today's visit     Patient name: Bryan Valdez Patient MRN: [id number] Date of visit: 07/10/23

## 2023-07-17 ENCOUNTER — Ambulatory Visit: Admitting: Physical Medicine and Rehabilitation

## 2023-07-17 VITALS — BP 118/81 | HR 59

## 2023-07-17 DIAGNOSIS — M47816 Spondylosis without myelopathy or radiculopathy, lumbar region: Secondary | ICD-10-CM | POA: Diagnosis not present

## 2023-07-17 DIAGNOSIS — G8929 Other chronic pain: Secondary | ICD-10-CM

## 2023-07-17 DIAGNOSIS — M47819 Spondylosis without myelopathy or radiculopathy, site unspecified: Secondary | ICD-10-CM

## 2023-07-17 DIAGNOSIS — M545 Low back pain, unspecified: Secondary | ICD-10-CM

## 2023-07-17 NOTE — Progress Notes (Signed)
 Patient comes in today with chronic low back pain. Pain radiates down right leg. Takes Oxycodone  as needed for pain. PCP currently prescribing him pain meds since he currently waiting for an appt with new pain doctor. Previous back injections helped temp. Also takes Celebrex , Flexeril , and Gabapentin . Would like to get another back injection. MRI L-spine done 01/16/2023. Xrays done 12/24/2021. Last back injection done 02/13/2023.
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
## 2023-07-18 ENCOUNTER — Encounter: Payer: Self-pay | Admitting: Physical Medicine and Rehabilitation

## 2023-07-28 ENCOUNTER — Ambulatory Visit (INDEPENDENT_AMBULATORY_CARE_PROVIDER_SITE_OTHER): Admitting: Internal Medicine

## 2023-07-28 ENCOUNTER — Encounter: Payer: Self-pay | Admitting: Internal Medicine

## 2023-07-28 VITALS — BP 110/62 | HR 71 | Temp 98.0°F | Ht 72.0 in | Wt 188.6 lb

## 2023-07-28 DIAGNOSIS — M5412 Radiculopathy, cervical region: Secondary | ICD-10-CM

## 2023-07-28 DIAGNOSIS — G8929 Other chronic pain: Secondary | ICD-10-CM | POA: Diagnosis not present

## 2023-07-28 DIAGNOSIS — N182 Chronic kidney disease, stage 2 (mild): Secondary | ICD-10-CM

## 2023-07-28 DIAGNOSIS — M5441 Lumbago with sciatica, right side: Secondary | ICD-10-CM | POA: Diagnosis not present

## 2023-07-28 DIAGNOSIS — Z79899 Other long term (current) drug therapy: Secondary | ICD-10-CM

## 2023-07-28 DIAGNOSIS — F1120 Opioid dependence, uncomplicated: Secondary | ICD-10-CM

## 2023-07-28 DIAGNOSIS — R3121 Asymptomatic microscopic hematuria: Secondary | ICD-10-CM | POA: Diagnosis not present

## 2023-07-28 DIAGNOSIS — M4802 Spinal stenosis, cervical region: Secondary | ICD-10-CM

## 2023-07-28 MED ORDER — OXYCODONE-ACETAMINOPHEN 10-325 MG PO TABS: 1.0000 | ORAL_TABLET | Freq: Three times a day (TID) | ORAL | 0 refills | Status: DC | PRN

## 2023-07-28 MED ORDER — GABAPENTIN 300 MG PO CAPS
300.0000 mg | ORAL_CAPSULE | Freq: Four times a day (QID) | ORAL | 3 refills | Status: AC
Start: 2023-07-28 — End: ?

## 2023-07-28 NOTE — Assessment & Plan Note (Signed)
 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 Opioid management needs to transition to a pain specialist but has been delayed due to patient reports he was advised by a staff member to not go to the pain referral that I referred him to.  A random pill count indicates appropriate use of prescribed oxycodone . Cannabis use poses additional legal risks for opioid prescribing. A gradual reduction of oxycodone  from 90 to 85 per month is planned until he is under the care of a pain clinic.

## 2023-07-28 NOTE — Assessment & Plan Note (Signed)
 He experiences ongoing chronic bilateral low back pain with right-sided sciatica, reporting a burning and stinging sensation in the big toe likely due to nerve compression from spinal issues. Previous discussions with Dr. Sulema Endo concluded that spine surgery is not recommended due to limited benefit and potential harm. Gabapentin  is prescribed for nerve pain management. A referral to a pain management specialist is needed to take over opioid prescribing. Gabapentin  will be continued, and a referral to physical medicine and rehabilitation is considered for advanced physical therapy needs.

## 2023-07-28 NOTE — Assessment & Plan Note (Signed)
 He has chronic microscopic hematuria, persistent for years. A previous urology consultation was noted, but he was lost to follow-up. Blood in urine is associated with kidney disease, and previous antibiotic treatments have been ineffective. He needs to contact urologist Andrena Ke for follow-up on kidney and urine issues.
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
## 2023-07-30 ENCOUNTER — Ambulatory Visit: Admitting: Orthopedic Surgery

## 2023-08-06 ENCOUNTER — Telehealth: Payer: Self-pay | Admitting: Physical Medicine and Rehabilitation

## 2023-08-06 ENCOUNTER — Other Ambulatory Visit: Payer: Self-pay | Admitting: Licensed Clinical Social Worker

## 2023-08-06 NOTE — Patient Instructions (Signed)
 Visit Information  Thank you for taking time to visit with me today. Please don't hesitate to contact me if I can be of assistance to you before our next scheduled appointment.  Your next care management appointment is by telephone on 09/05/2023.   Please call the care guide team at [phone] if you need to cancel, schedule, or reschedule an appointment.   Please call the Suicide and Crisis Lifeline: 988 if you are experiencing a Mental Health or Behavioral Health Crisis or need someone to talk to.  Hale Level, LCSW [facility], [facility] Licensed Clinical Social Worker Care Coordinator [phone]

## 2023-08-06 NOTE — Telephone Encounter (Signed)
 Patient continues with physician directed home exercise regimen twice weekly, he has done so for the last year. His pain continues despite consistent physician directed home exercise regimen. He underwent bilateral L4-L5 facet joints in our office on 02/13/2023. Greater than 50% relief of pain with this procedure for over 5 months. We are requesting to repeat this injections as this treatment was significantly beneficial in alleviating his pain. He is now requesting opioid pain medications due to severe pain and inability to function. He was previously evaluated by our spine surgeon Dr. Colette Davies, per Dr. Frieda Jew notes he is not surgical candidate.
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
## 2023-08-07 ENCOUNTER — Other Ambulatory Visit: Payer: Self-pay
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
## 2023-08-08 NOTE — Patient Instructions (Signed)
 Visit Information  Thank you for taking time to visit with me today. Please don't hesitate to contact me if I can be of assistance to you before our next scheduled telephone appointment.  Our next appointment is by telephone on 7/3 at 4pm.  Following is a copy of your care plan:   Goals Addressed             This Visit's Progress    [facility] RN Care Plan       Problems:  Chronic Disease Management support and education needs related to Cervical and Lumbar Spinal Stenosis Financial Constraints.  Goal: Over the next 3 months the Patient will continue to work with RN Care Manager and/or Social Worker to address care management and care coordination needs related to Cervical and Lumbar Spinal Stenosis as evidenced by adherence to CM Team Scheduled appointments      Interventions:   Coping strategies managing Spinal Stenosis  (Status:  Ongoing)  Long Term Goal Evaluation of current treatment plan related to Cervical and Lumbar Spinal Stenosis, Financial constraints related to inability to work and awaiting disability application decision due Sept 2025, Limited social support, and ADL IADL limitations self-management and patient's adherence to plan as established by provider. Discussed plans with patient for ongoing care management follow up and provided patient with direct contact information for care management team Evaluation of current treatment plan related to Spinal Stenosis and patient's adherence to plan as established by provider Social Work referral for financial challenges and exploring non-medication coping strategies tp deal with chronic intractable pain Discussed plans with patient for ongoing care management follow up and provided patient with direct contact information for care management team Screening for signs and symptoms of depression related to chronic disease state  Assessed social determinant of health barriers  Pain Interventions:  (Status:  Ongoing) Short Term  Goal Pain assessment performed Medications reviewed Reviewed provider established plan for pain management Discussed importance of adherence to all scheduled medical appointments Counseled on the importance of reporting any/all new or changed pain symptoms or management strategies to pain management provider Advised patient to report to care team affect of pain on daily activities Discussed use of relaxation techniques and/or diversional activities to assist with pain reduction (distraction, imagery, relaxation, massage, acupressure, TENS, heat, and cold application Reviewed with patient prescribed pharmacological and nonpharmacological pain relief strategies Screening for signs and symptoms of depression related to chronic disease state  Assessed social determinant of health barriers  Patient Self-Care Activities:  Attend all scheduled provider appointments Call pharmacy for medication refills 3-7 days in advance of running out of medications Call provider office for new concerns or questions  Perform all self care activities independently  Take medications as prescribed   Work with the social worker to address care coordination needs and will continue to work with the clinical team to address health care and disease management related needs  Plan:  The patient has been provided with contact information for the care management team and has been advised to call with any health related questions or concerns.  Next RN Case Management appt is 08/28/2023 at 4pm             Patient verbalizes understanding of instructions and care plan provided today and agrees to view in MyChart. Active MyChart status and patient understanding of how to access instructions and care plan via MyChart confirmed with patient.     The patient has been provided with contact information for the care  management team and has been advised to call with any health related questions or concerns.   Please call the  care guide team at [phone] if you need to cancel or reschedule your appointment.   Please call 1-800-273-TALK (toll free, 24 hour hotline) if you are experiencing a Mental Health or Behavioral Health Crisis or need someone to talk to.  Demaris Bousquet A. Saverio Curling RN, BA, [facility], CRRN [place]  [facility] Population Health RN Care Manager Direct Dial: [phone]  Fax: [phone]

## 2023-08-14 ENCOUNTER — Other Ambulatory Visit: Payer: Self-pay | Admitting: Physical Medicine and Rehabilitation

## 2023-08-14 MED ORDER — DIAZEPAM 5 MG PO TABS: ORAL_TABLET | ORAL | 0 refills | Status: AC
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
## 2023-08-15 ENCOUNTER — Other Ambulatory Visit: Payer: Self-pay

## 2023-08-15 ENCOUNTER — Encounter: Payer: Self-pay | Admitting: Physical Therapy

## 2023-08-15 ENCOUNTER — Ambulatory Visit: Attending: Internal Medicine | Admitting: Physical Therapy

## 2023-08-15 DIAGNOSIS — R252 Cramp and spasm: Secondary | ICD-10-CM | POA: Diagnosis present

## 2023-08-15 DIAGNOSIS — M6281 Muscle weakness (generalized): Secondary | ICD-10-CM | POA: Diagnosis present

## 2023-08-15 DIAGNOSIS — M79604 Pain in right leg: Secondary | ICD-10-CM | POA: Diagnosis present

## 2023-08-15 DIAGNOSIS — M5459 Other low back pain: Secondary | ICD-10-CM | POA: Insufficient documentation

## 2023-08-20 ENCOUNTER — Telehealth: Payer: Self-pay | Admitting: Rehabilitative and Restorative Service Providers"

## 2023-08-20 ENCOUNTER — Ambulatory Visit: Admitting: Rehabilitative and Restorative Service Providers"

## 2023-08-20 NOTE — Telephone Encounter (Signed)
 Attempted to contact patient due to missed appointment on 08/20/23 at 7:30 AM, but there was no answer and voicemail had not been set up to allow for a message.

## 2023-08-21 ENCOUNTER — Ambulatory Visit: Admitting: Rehabilitative and Restorative Service Providers"

## 2023-08-22 ENCOUNTER — Other Ambulatory Visit: Payer: Self-pay | Admitting: Internal Medicine

## 2023-08-22 DIAGNOSIS — Z5941 Food insecurity: Secondary | ICD-10-CM

## 2023-08-25 ENCOUNTER — Other Ambulatory Visit: Payer: Self-pay

## 2023-08-25 ENCOUNTER — Ambulatory Visit: Admitting: Physical Medicine and Rehabilitation

## 2023-08-25 VITALS — BP 144/87 | HR 52

## 2023-08-25 DIAGNOSIS — M47816 Spondylosis without myelopathy or radiculopathy, lumbar region: Secondary | ICD-10-CM | POA: Diagnosis not present

## 2023-08-25 MED ORDER — METHYLPREDNISOLONE ACETATE 40 MG/ML IJ SUSP
40.0000 mg | Freq: Once | INTRAMUSCULAR | Status: AC
  Administered 2023-08-25: 40 mg

## 2023-08-25 NOTE — Patient Instructions (Signed)

## 2023-08-25 NOTE — Procedures (Signed)
 Lumbar Facet Joint Intra-Articular Injection(s) with Fluoroscopic Guidance  Patient: Bryan Valdez      Date of Birth: Sep 27, 1965 MRN: [id number] PCP: Jesus Bernardino MATSU, MD      Visit Date: 08/25/2023   Universal Protocol:    Date/Time: 08/25/2023  Consent Given By: the patient  Position: PRONE   Additional Comments: Vital signs were monitored before and after the procedure. Patient was prepped and draped in the usual sterile fashion. The correct patient, procedure, and site was verified.   Injection Procedure Details:  Procedure Site One Meds Administered:  Meds ordered this encounter  Medications   methylPREDNISolone  acetate (DEPO-MEDROL ) injection 40 mg     Laterality: Bilateral  Location/Site:  L4-L5  Needle size: 22 guage  Needle type: Spinal  Needle Placement: Articular  Findings:  -Comments: Excellent flow of contrast producing a partial arthrogram.  Procedure Details: The fluoroscope beam is vertically oriented in AP, and the inferior recess is visualized beneath the lower pole of the inferior apophyseal process, which represents the target point for needle insertion. When direct visualization is difficult the target point is located at the medial projection of the vertebral pedicle. The region overlying each aforementioned target is locally anesthetized with a 1 to 2 ml. volume of 1% Lidocaine  without Epinephrine.   The spinal needle was inserted into each of the above mentioned facet joints using biplanar fluoroscopic guidance. A 0.25 to 0.5 ml. volume of Isovue-250 was injected and a partial facet joint arthrogram was obtained. A single spot film was obtained of the resulting arthrogram.    One to 1.25 ml of the steroid/anesthetic solution was then injected into each of the facet joints noted above.   Additional Comments:  No complications occurred Dressing: 2 x 2 sterile gauze and Band-Aid    Post-procedure details: Patient was observed during the  procedure. Post-procedure instructions were reviewed.  Patient left the clinic in stable condition.

## 2023-08-25 NOTE — Progress Notes (Signed)
 Bryan Valdez - 58 y.o. male MRN [id number]  Date of birth: [birth year]  Office Visit Note: Visit Date: 08/25/2023 PCP: Jesus Bernardino MATSU, MD Referred by: Jesus Bernardino MATSU, MD  Subjective: Chief Complaint  Patient presents with   Lower Back - Pain   HPI:  Bryan Valdez is a 58 y.o. male who comes in today for planned repeat Bilateral L4-5 Lumbar facet/medial branch block with fluoroscopic guidance.  The patient has failed conservative care including home exercise, medications, time and activity modification.  This injection will be diagnostic and hopefully therapeutic.  Please see requesting physician notes for further details and justification.  Exam shows concordant low back pain with facet joint loading and extension. Patient received more than 50% pain relief from prior injection. Ultimately might be an RFA candidate.    Referring:Megan Trudy, FNP and Dr. Ozell Ada   ROS Otherwise per HPI.  Assessment & Plan: Visit Diagnoses:    ICD-10-CM   1. Spondylosis without myelopathy or radiculopathy, lumbar region  M47.816 XR C-ARM NO REPORT    Facet Injection    methylPREDNISolone  acetate (DEPO-MEDROL ) injection 40 mg      Plan: No additional findings.   Meds & Orders:  Meds ordered this encounter  Medications   methylPREDNISolone  acetate (DEPO-MEDROL ) injection 40 mg    Orders Placed This Encounter  Procedures   Facet Injection   XR C-ARM NO REPORT    Follow-up: Return for visit to requesting provider as needed.   Procedures: No procedures performed  Lumbar Facet Joint Intra-Articular Injection(s) with Fluoroscopic Guidance  Patient: Bryan Valdez      Date of Birth: 1965/05/20 MRN: [id number] PCP: Jesus Bernardino MATSU, MD      Visit Date: 08/25/2023   Universal Protocol:    Date/Time: 08/25/2023  Consent Given By: the patient  Position: PRONE   Additional Comments: Vital signs were monitored before and after the procedure. Patient was prepped and draped in the  usual sterile fashion. The correct patient, procedure, and site was verified.   Injection Procedure Details:  Procedure Site One Meds Administered:  Meds ordered this encounter  Medications   methylPREDNISolone  acetate (DEPO-MEDROL ) injection 40 mg     Laterality: Bilateral  Location/Site:  L4-L5  Needle size: 22 guage  Needle type: Spinal  Needle Placement: Articular  Findings:  -Comments: Excellent flow of contrast producing a partial arthrogram.  Procedure Details: The fluoroscope beam is vertically oriented in AP, and the inferior recess is visualized beneath the lower pole of the inferior apophyseal process, which represents the target point for needle insertion. When direct visualization is difficult the target point is located at the medial projection of the vertebral pedicle. The region overlying each aforementioned target is locally anesthetized with a 1 to 2 ml. volume of 1% Lidocaine  without Epinephrine.   The spinal needle was inserted into each of the above mentioned facet joints using biplanar fluoroscopic guidance. A 0.25 to 0.5 ml. volume of Isovue-250 was injected and a partial facet joint arthrogram was obtained. A single spot film was obtained of the resulting arthrogram.    One to 1.25 ml of the steroid/anesthetic solution was then injected into each of the facet joints noted above.   Additional Comments:  No complications occurred Dressing: 2 x 2 sterile gauze and Band-Aid    Post-procedure details: Patient was observed during the procedure. Post-procedure instructions were reviewed.  Patient left the clinic in stable condition.    Clinical History: MRI LUMBAR SPINE WITHOUT CONTRAST  TECHNIQUE: Multiplanar, multisequence MR imaging of the lumbar spine was performed. No intravenous contrast was administered.   COMPARISON:  MRI of the lumbar spine March 25, 2022.   FINDINGS: Segmentation:  Standard.   Alignment: Dextroconvex scoliosis.  Grade 1 anterolisthesis of L4 over L5, unchanged.   Vertebrae: No evidence of fracture or aggressive bone lesion. Prominent degenerative changes of the L5-S1 endplates with associated mild marrow edema, not significantly changed compared to prior MRI.   Conus medullaris and cauda equina: Conus extends to the T12-L1 level. Conus and cauda equina appear normal.   Paraspinal and other soft tissues: Negative.   Disc levels:   T12-L1: Moderate facet degenerative changes. No spinal canal or neural foraminal stenosis.   L1-2: Shallow disc bulge and mild-to-moderate facet degenerative changes without significant spinal canal or neural foraminal stenosis.   L2-3: Disc bulge,, mildly asymmetric to the left, mild facet degenerative changes, ligamentum flavum redundancy and mild prominence of the posterior epidural fat. Findings result in mild-to-moderate left subarticular zone stenosis and mild bilateral neural foraminal narrowing, unchanged.   L3-4: Left asymmetric disc bulge, moderate hypertrophic facet degenerative change ligamentum flavum redundancy, and moderate prominence of the posterior epidural fat. Findings result in mild spinal canal stenosis with moderate narrowing of the bilateral subarticular zones, left greater than right, mild right and moderate to severe left neural foraminal narrowing, similar to prior MRI.   L4-5: Disc bulge, prominent hypertrophic facet degenerative changes, ligamentum flavum redundancy and prominence of the epidural fat resulting in severe spinal canal stenosis and severe bilateral neural foraminal narrowing, similar to prior.   L5-S1: Disc bulge with associated osteophytic component, moderate facet degenerative changes and significant prominence of the epidural fat resulting in tapering of the thecal sac. Severe right and moderate left neural foraminal narrowing, similar to prior MRI.   IMPRESSION: 1. Multilevel degenerative changes of the  lumbar spine, as described above, not significantly changed compared to prior MRI. 2. Severe spinal canal stenosis at L4-5. 3. Moderate to severe left neural foraminal narrowing at L3-4. 4. Severe bilateral neural foraminal narrowing at L4-5. 5. Severe right and moderate left neural foraminal narrowing at L5-S1.     Electronically Signed   By: Katyucia  de Macedo Rodrigues M.D.   On: 01/16/2023 12:29     Objective:  VS:  HT:    WT:   BMI:     BP:(!) 144/87  HR:(!) 52bpm  TEMP: ( )  RESP:  Physical Exam Vitals and nursing note reviewed.  Constitutional:      General: He is not in acute distress.    Appearance: Normal appearance. He is well-developed. He is not ill-appearing.  HENT:     Head: Normocephalic and atraumatic.     Right Ear: External ear normal.     Left Ear: External ear normal.     Nose: No congestion.   Eyes:     Extraocular Movements: Extraocular movements intact.     Conjunctiva/sclera: Conjunctivae normal.     Pupils: Pupils are equal, round, and reactive to light.    Cardiovascular:     Rate and Rhythm: Normal rate.     Pulses: Normal pulses.     Heart sounds: Normal heart sounds.  Pulmonary:     Effort: Pulmonary effort is normal. No respiratory distress.  Abdominal:     General: There is no distension.     Palpations: Abdomen is soft.   Musculoskeletal:        General: No tenderness or signs of  injury.     Cervical back: Normal range of motion and neck supple. No rigidity.     Right lower leg: No edema.     Left lower leg: No edema.     Comments: Patient has good distal strength without clonus.   Skin:    General: Skin is warm and dry.     Findings: No erythema or rash.   Neurological:     General: No focal deficit present.     Mental Status: He is alert and oriented to person, place, and time.     Sensory: No sensory deficit.     Motor: No weakness or abnormal muscle tone.     Coordination: Coordination normal.     Gait: Gait  normal.   Psychiatric:        Mood and Affect: Mood normal.        Behavior: Behavior normal.      Imaging: No results found.

## 2023-08-25 NOTE — Progress Notes (Signed)
 Pain Scale   Average Pain 7 Patient states he has chronic lower back pain and has no relief.        +Driver, -BT, -Dye Allergies.

## 2023-08-27 ENCOUNTER — Telehealth: Payer: Self-pay

## 2023-08-27 ENCOUNTER — Telehealth: Payer: Self-pay | Admitting: Internal Medicine

## 2023-08-27 ENCOUNTER — Ambulatory Visit: Admitting: Internal Medicine

## 2023-08-27 ENCOUNTER — Encounter: Admitting: Rehabilitative and Restorative Service Providers"

## 2023-08-27 ENCOUNTER — Encounter: Payer: Self-pay | Admitting: Internal Medicine

## 2023-08-27 DIAGNOSIS — G8929 Other chronic pain: Secondary | ICD-10-CM

## 2023-08-27 DIAGNOSIS — M5412 Radiculopathy, cervical region: Secondary | ICD-10-CM

## 2023-08-27 DIAGNOSIS — M4802 Spinal stenosis, cervical region: Secondary | ICD-10-CM

## 2023-08-27 DIAGNOSIS — M549 Dorsalgia, unspecified: Secondary | ICD-10-CM

## 2023-08-27 MED ORDER — OXYCODONE-ACETAMINOPHEN 10-325 MG PO TABS: 1.0000 | ORAL_TABLET | Freq: Three times a day (TID) | ORAL | 0 refills | Status: DC | PRN

## 2023-08-27 NOTE — Telephone Encounter (Signed)
 Patient had an OV w/ PCP this morning @ 8:20 am but arrived at 8:38 am. I informed patient that per provider we would need to reschedule him. I offered patient to reschedule but he began to express his disappointment instead. Pt stated E2C2 agent told him he could come on to the office despite being informed of the 10 minute grace period. Before I could get patient rescheduled, he stated he would do it at a later time and left the office. I spoke with manager, Tammy Peace and we have canceled patient's appointment for today to avoid it being counted as a no show.

## 2023-08-27 NOTE — Telephone Encounter (Signed)
 noted Copied from CRM [id number]. Topic: General - Running Late >> Aug 27, 2023  8:15 AM Rosina BIRCH wrote: Patient/patient representative is calling because they are running late for an appointment.    ----------------------------------------------------------------------- From previous Reason for Contact - Appt Info/Confirm: Patient/patient representative is calling for information regarding an appointment.  Patient stated he is running late for his appointment today. I did let the patient  know that there is a ten minute grace period so as long as he was there by 8:30 he should be fine. The patient stated he will be right there at 8:30

## 2023-08-28 ENCOUNTER — Other Ambulatory Visit: Payer: Self-pay

## 2023-08-30 NOTE — Patient Outreach (Signed)
 08/28/2023, 4pm   Today's follow up with RNCM was scheduled to follow PCP visit scheduled on 08/27/23. Unfortunately, patient reported he had challenges getting to PCP's office in time and was told he would have to reschedule appointment. Rescheduled appointment is on Monday, 09/01/23 and patient and RNCM agreed to continue engagement after he has seen the PCP - encouraged patient to discuss patient's desire to be referred to a Pain Clinic. Will also send message to PCP's office regarding patient's desire to be seen at a Pain Clinic.   Next patient engagement with RNCM to discuss PCP follow up visit is 09/05/23 @ 2:45pm.  Channing A. Gordy RN, BA, [facility], CRRN [place]  [facility] Population Health RN Care Manager Direct Dial: [phone]  Fax: [phone]

## 2023-09-01 ENCOUNTER — Ambulatory Visit (INDEPENDENT_AMBULATORY_CARE_PROVIDER_SITE_OTHER): Admitting: Internal Medicine

## 2023-09-01 VITALS — BP 120/78 | HR 87 | Temp 98.2°F | Ht 72.0 in | Wt 185.6 lb

## 2023-09-01 DIAGNOSIS — G8929 Other chronic pain: Secondary | ICD-10-CM | POA: Diagnosis not present

## 2023-09-01 DIAGNOSIS — Z599 Problem related to housing and economic circumstances, unspecified: Secondary | ICD-10-CM

## 2023-09-01 DIAGNOSIS — J320 Chronic maxillary sinusitis: Secondary | ICD-10-CM

## 2023-09-01 DIAGNOSIS — R3121 Asymptomatic microscopic hematuria: Secondary | ICD-10-CM | POA: Diagnosis not present

## 2023-09-01 DIAGNOSIS — M549 Dorsalgia, unspecified: Secondary | ICD-10-CM

## 2023-09-01 DIAGNOSIS — F1120 Opioid dependence, uncomplicated: Secondary | ICD-10-CM | POA: Diagnosis not present

## 2023-09-01 DIAGNOSIS — M5441 Lumbago with sciatica, right side: Secondary | ICD-10-CM

## 2023-09-01 DIAGNOSIS — Z59811 Housing instability, housed, with risk of homelessness: Secondary | ICD-10-CM

## 2023-09-01 DIAGNOSIS — R079 Chest pain, unspecified: Secondary | ICD-10-CM

## 2023-09-01 DIAGNOSIS — M5412 Radiculopathy, cervical region: Secondary | ICD-10-CM

## 2023-09-01 DIAGNOSIS — H6690 Otitis media, unspecified, unspecified ear: Secondary | ICD-10-CM

## 2023-09-01 DIAGNOSIS — M4802 Spinal stenosis, cervical region: Secondary | ICD-10-CM

## 2023-09-01 MED ORDER — FLUTICASONE PROPIONATE 50 MCG/ACT NA SUSP: 2.0000 | Freq: Every day | NASAL | 6 refills | Status: DC

## 2023-09-01 MED ORDER — LORATADINE 10 MG PO TABS: 10.0000 mg | ORAL_TABLET | Freq: Every day | ORAL | 11 refills | Status: AC

## 2023-09-01 MED ORDER — AMOXICILLIN-POT CLAVULANATE 875-125 MG PO TABS: 1.0000 | ORAL_TABLET | Freq: Two times a day (BID) | ORAL | 0 refills | Status: DC

## 2023-09-01 MED ORDER — OXYCODONE-ACETAMINOPHEN 10-325 MG PO TABS: 1.0000 | ORAL_TABLET | Freq: Three times a day (TID) | ORAL | 0 refills | Status: DC | PRN

## 2023-09-01 MED ORDER — PSEUDOEPHEDRINE HCL ER 120 MG PO TB12: 120.0000 mg | ORAL_TABLET | Freq: Two times a day (BID) | ORAL | 0 refills | Status: DC

## 2023-09-01 MED ORDER — FLUTICASONE PROPIONATE 50 MCG/ACT NA SUSP: 2.0000 | Freq: Every day | NASAL | 6 refills | Status: AC

## 2023-09-01 NOTE — Progress Notes (Signed)
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  . Alkaline Phosphatase 05/27/2023 74  39 - 117 U/L Final  . AST 05/27/2023 18  0 - 37 U/L Final  . ALT 05/27/2023 17  0 - 53 U/L Final  . Total Protein 05/27/2023 7.5  6.0 - 8.3 g/dL Final  . Albumin 95/98/7974 4.4  3.5 - 5.2 g/dL Final  . GFR 95/98/7974 96.13  >60.00 mL/min Final  . Calcium 05/27/2023 9.3  8.4 - 10.5 mg/dL Final  . WBC 95/98/7974 5.5  4.0 - 10.5 K/uL Final  . RBC 05/27/2023 4.69  4.22 - 5.81 Mil/uL Final  . Hemoglobin 05/27/2023 14.9  13.0 - 17.0 g/dL Final  . HCT 95/98/7974 44.4  39.0 - 52.0 % Final  . MCV 05/27/2023 94.8  78.0 - 100.0 fl Final  . MCHC 05/27/2023 33.5  30.0 - 36.0 g/dL Final  . RDW 95/98/7974 14.1  11.5 - 15.5 % Final  . Platelets 05/27/2023 264.0  150.0 - 400.0 K/uL Final  . Neutrophils Relative % 05/27/2023 50.6  43.0 - 77.0 % Final  . Lymphocytes Relative 05/27/2023 37.5  12.0 - 46.0 % Final  . Monocytes Relative 05/27/2023 10.0  3.0 - 12.0 % Final  . Eosinophils Relative 05/27/2023 1.6  0.0 - 5.0 % Final  . Basophils Relative 05/27/2023 0.3  0.0 - 3.0 % Final  . Neutro Abs 05/27/2023 2.8  1.4 - 7.7 K/uL Final  .  Lymphs Abs 05/27/2023 2.1  0.7 - 4.0 K/uL Final  . Monocytes Absolute 05/27/2023 0.5  0.1 - 1.0 K/uL Final  . Eosinophils Absolute 05/27/2023 0.1  0.0 -  0.7 K/uL Final  . Basophils Absolute 05/27/2023 0.0  0.0 - 0.1 K/uL Final  . TSH 05/27/2023 0.741  0.450 - 4.500 uIU/mL Final  . VITD 05/27/2023 19.56 (L)  30.00 - 100.00 ng/mL Final  Lab on 05/21/2023  Component Date Value Ref Range Status  . Testosterone  05/21/2023 376.00  300.00 - 890.00 ng/dL Final  Lab on 96/74/7974  Component Date Value Ref Range Status  . Neisseria Gonorrhea 05/20/2023 Negative   Final  . Chlamydia 05/20/2023 Negative   Final  . Trichomonas 05/20/2023 Negative   Final  . Comment 05/20/2023 Normal Reference Ranger Chlamydia - Negative   Final  . Comment 05/20/2023 Normal Reference Range Neisseria Gonorrhea - Negative   Final  . Comment 05/20/2023 Normal Reference Range Trichomonas - Negative   Final  . MICRO NUMBER: 05/20/2023 [id number]   Final  . SPECIMEN QUALITY: 05/20/2023 Adequate   Final  . Sample Source 05/20/2023 URINE   Final  . STATUS: 05/20/2023 FINAL   Final  . Result: 05/20/2023 Less than 10,000 CFU/mL of single Gram positive organism isolated. No further testing will be performed. If clinically indicated, recollection using a method to minimize contamination, with prompt transfer to Urine Culture Transport Tube, is recommended.   Final  . Color, Urine 05/20/2023 YELLOW  Yellow;Lt. Yellow;Straw;Dark Yellow;Amber;Green;Red;Brown Final  . APPearance 05/20/2023 CLEAR  Clear;Turbid;Slightly Cloudy;Cloudy Final  . Specific Gravity, Urine 05/20/2023 1.020  1.000 - 1.030 Final  . pH 05/20/2023 7.5  5.0 - 8.0 Final  . Total Protein, Urine 05/20/2023 NEGATIVE  Negative Final  . Urine Glucose 05/20/2023 NEGATIVE  Negative Final  . Ketones, ur 05/20/2023 NEGATIVE  Negative Final  . Bilirubin Urine 05/20/2023 NEGATIVE  Negative Final  . Hgb urine dipstick 05/20/2023 TRACE-INTACT (A)  Negative Final  . Urobilinogen, UA 05/20/2023 0.2  0.0 - 1.0 Final  . Leukocytes,Ua 05/20/2023 NEGATIVE  Negative Final  . Nitrite 05/20/2023 NEGATIVE  Negative Final  . WBC, UA  05/20/2023 none seen  0-2/hpf Final  . RBC / HPF 05/20/2023 0-2/hpf  0-2/hpf Final  . Mucus, UA 05/20/2023 Presence of (A)  None Final  . Squamous Epithelial / HPF 05/20/2023 Rare(0-4/hpf)  Rare(0-4/hpf) Final  . Testosterone  05/20/2023 313.69  300.00 - 890.00 ng/dL Final  Office Visit on 07/23/2022  Component Date Value Ref Range Status  . Color, Urine 07/23/2022 YELLOW  Yellow;Lt. Yellow;Straw;Dark Yellow;Amber;Green;Red;Brown Final  . APPearance 07/23/2022 CLEAR  Clear;Turbid;Slightly Cloudy;Cloudy Final  . Specific Gravity, Urine 07/23/2022 1.015  1.000 - 1.030 Final  . pH 07/23/2022 6.0  5.0 - 8.0 Final  . Total Protein, Urine 07/23/2022 NEGATIVE  Negative Final  . Urine Glucose 07/23/2022 NEGATIVE  Negative Final  . Ketones, ur 07/23/2022 NEGATIVE  Negative Final  . Bilirubin Urine 07/23/2022 NEGATIVE  Negative Final  . Hgb urine dipstick 07/23/2022 SMALL (A)  Negative Final  . Urobilinogen, UA 07/23/2022 0.2  0.0 - 1.0 Final  . Leukocytes,Ua 07/23/2022 TRACE (A)  Negative Final  . Nitrite 07/23/2022 NEGATIVE  Negative Final  . WBC, UA 07/23/2022 0-2/hpf  0-2/hpf Final  . RBC / HPF 07/23/2022 0-2/hpf  0-2/hpf Final  . Squamous Epithelial / HPF 07/23/2022 Rare(0-4/hpf)  Rare(0-4/hpf) Final  . Amorphous 07/23/2022 Present (A)  None;Present Final  . PSA 07/23/2022 1.05  0.10 - 4.00 ng/mL Final  Office Visit  on 06/05/2022  Component Date Value Ref Range Status  . Color, Urine 06/05/2022 YELLOW  Yellow;Lt. Yellow;Straw;Dark Yellow;Amber;Green;Red;Brown Final  . APPearance 06/05/2022 CLEAR  Clear;Turbid;Slightly Cloudy;Cloudy Final  . Specific Gravity, Urine 06/05/2022 >=1.030 (A)  1.000 - 1.030 Final  . pH 06/05/2022 6.0  5.0 - 8.0 Final  . Total Protein, Urine 06/05/2022 NEGATIVE  Negative Final  . Urine Glucose 06/05/2022 NEGATIVE  Negative Final  . Ketones, ur 06/05/2022 NEGATIVE  Negative Final  . Bilirubin Urine 06/05/2022 NEGATIVE  Negative Final  . Hgb urine dipstick  06/05/2022 SMALL (A)  Negative Final  . Urobilinogen, UA 06/05/2022 0.2  0.0 - 1.0 Final  . Leukocytes,Ua 06/05/2022 NEGATIVE  Negative Final  . Nitrite 06/05/2022 NEGATIVE  Negative Final  . WBC, UA 06/05/2022 0-2/hpf  0-2/hpf Final  . RBC / HPF 06/05/2022 3-6/hpf (A)  0-2/hpf Final  . Squamous Epithelial / HPF 06/05/2022 Rare(0-4/hpf)  Rare(0-4/hpf) Final  Office Visit on 05/22/2022  Component Date Value Ref Range Status  . Color, Urine 05/22/2022 YELLOW  YELLOW Final  . APPearance 05/22/2022 CLEAR  CLEAR Final  . Specific Gravity, Urine 05/22/2022 1.018  1.001 - 1.035 Final  . pH 05/22/2022 5.5  5.0 - 8.0 Final  . Glucose, UA 05/22/2022 NEGATIVE  NEGATIVE Final  . Bilirubin Urine 05/22/2022 NEGATIVE  NEGATIVE Final  . Ketones, ur 05/22/2022 NEGATIVE  NEGATIVE Final  . Hgb urine dipstick 05/22/2022 NEGATIVE  NEGATIVE Final  . Protein, ur 05/22/2022 NEGATIVE  NEGATIVE Final  . Nitrite 05/22/2022 NEGATIVE  NEGATIVE Final  . Leukocytes,Ua 05/22/2022 NEGATIVE  NEGATIVE Final  . WBC, UA 05/22/2022 NONE SEEN  0 - 5 /HPF Final  . RBC / HPF 05/22/2022 0-2  0 - 2 /HPF Final  . Squamous Epithelial / HPF 05/22/2022 NONE SEEN  < OR = 5 /HPF Final  . Bacteria, UA 05/22/2022 NONE SEEN  NONE SEEN /HPF Final  . Hyaline Cast 05/22/2022 NONE SEEN  NONE SEEN /LPF Final  . Note 05/22/2022    Final  . Neisseria Gonorrhea 05/22/2022 Negative   Final  . Chlamydia 05/22/2022 Negative   Final  . Trichomonas 05/22/2022 Negative   Final  . Comment 05/22/2022 Normal Reference Ranger Chlamydia - Negative   Final  . Comment 05/22/2022 Normal Reference Range Neisseria Gonorrhea - Negative   Final  . Comment 05/22/2022 Normal Reference Range Trichomonas - Negative   Final  No image results found. XR C-ARM NO REPORT Result Date: 08/25/2023 Please see Notes tab for imaging impression.        ASSESSMENT & PLAN   Assessment & Plan Chronic intractable pain  Financial difficulties  Asymptomatic microscopic  hematuria  Housing instability, currently housed, at risk for homelessness  Uncomplicated opioid dependence (HCC)   Assessment and Plan Assessment & Plan      ORDER ASSOCIATIONS  #   DIAGNOSIS / CONDITION ICD-10 ENCOUNTER ORDER  No diagnosis found.       Orders Placed in Encounter:   Lab Orders  No laboratory test(s) ordered today   Imaging Orders  No imaging studies ordered today   Referral Orders  No referral(s) requested today   No orders of the defined types were placed in this encounter.   No orders of the defined types were placed in this encounter.  ED Discharge Orders     None         This document was synthesized by artificial intelligence (Abridge) using HIPAA-compliant recording of the clinical interaction;   We discussed  the use of AI scribe software for clinical note transcription with the patient, who gave verbal consent to proceed. additional Info: This encounter employed state-of-the-art, real-time, collaborative documentation. The patient actively reviewed and assisted in updating their electronic medical record on a shared screen, ensuring transparency and facilitating joint problem-solving for the problem list, overview, and plan. This approach promotes accurate, informed care. The treatment plan was discussed and reviewed in detail, including medication safety, potential side effects, and all patient questions. We confirmed understanding and comfort with the plan. Follow-up instructions were established, including contacting the office for any concerns, returning if symptoms worsen, persist, or new symptoms develop, and precautions for potential emergency department visits.

## 2023-09-01 NOTE — Patient Instructions (Addendum)
 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 Since the episode in April 2024, you have not experienced any recurrence of symptoms and have been healthy without any fevers. You are not currently on any specific medication for meningitis or encephalitis and have not had any further episodes.  YOUR PLAN:  -ASEPTIC MENINGITIS AND ENCEPHALITIS: Aseptic meningitis and encephalitis are inflammations of the protective membranes covering the brain and spinal cord, usually caused by a viral infection. Your episode in April 2024 was not serious or life-threatening and is unlikely to recur. You are healthy and not at risk for recurrence. I have provided reassurance with a 99.999% confidence that recurrence is unlikely. I will also provide a letter stating that the meningitis and encephalitis was a one-time event and is unlikely to affect your future health. I recommend using Simply Saline nasal spray to reduce the risk of sinus infections and manage allergies. Using the nasal spray before Flonase  can enhance its effectiveness.  INSTRUCTIONS:  No follow-up is necessary unless you experience any new symptoms or health concerns. Continue to use Simply Saline nasal spray as recommended.    ALLERGY MANAGEMENT PLAN  This plan is designed to help manage your allergic rhinitis (nasal allergies) effectively. Follow these steps daily for best results.  Sinus saline sprays- use nightly, and after sneezing episodes or exposure to allergen.  Insert deeply and spray mist into nose while leaning over sink at 45 degrees,  while gently breathing. Also blow out onto tissue while leaning forward 45 degrees. Once daily, after a sinus rinse, use sensimist.  Just before bedtime is best. This only needed if allergies acting up.  If this is inadequate add-on once daily for levocetirizine / xyzal 5 mg for nondrowsy antihistamine Take benadryl 25 mg at bedtime also if  allergic mucus is persisting  When allergies cause chronic swelling in sinuses, it leads to sinus infections:    DAILY TREATMENT ROUTINE   Time of Day Treatment Steps  Morning 1. Saline Nasal Spray - Use to cleanse nasal passages 2. Xyzal (levocetirizine) - Take one tablet daily   Throughout Day Saline Nasal Spray - Use 2 additional times (mid-day and afternoon)   Evening/Bedtime 1. Saline Nasal Rinse - Thoroughly clean nasal passages 2. Flonase  Sensimist - Apply after nasal rinse 3. Benadryl (diphenhydramine) - Take 25mg  if experiencing persistent congestion    PROPER TECHNIQUE GUIDE       Saline Nasal Spray/Rinse Technique: Lean forward over sink at a 45-degree angle Turn head slightly to one side Insert spray tip into upper nostril Spray gently while breathing lightly through your nose Repeat on other side Gently blow nose to clear excess solution Use saline spray 3 times daily to keep nasal passages moist and clear allergens.       Flonase  Sensimist Technique: Shake bottle gently before each use Prime the bottle if it's new or hasn't been used for a week Tilt your head forward slightly Insert tip into nostril, pointing away from the center of your nose Spray while inhaling gently Repeat in other nostril Use Flonase  Sensimist once daily, preferably at bedtime after using saline rinse. It may take several days of regular use to feel maximum benefit.   WHY FLONASE  SENSIMIST?   Benefits of Flonase  Sensimist:  Alcohol-free and scent-free formula - gentler on sensitive nasal passages Fine mist application - more comfortable with less dripping down throat Effectively relieves nasal congestion, sneezing, runny nose, and even  eye symptoms 24-hour relief with once-daily dosing Uses a more potent form of fluticasone  that works at a lower dose Less liquid per spray means less discomfort  UNDERSTANDING YOUR MEDICATIONS   Medication How It Works Important Notes  Flonase   Sensimist (fluticasone  furoate) Reduces inflammation in nasal passages, addressing the underlying cause of allergy symptoms - Takes several days for full effect - Use daily for best results - Safe for long-term use   Xyzal (levocetirizine) Blocks histamine to reduce allergy symptoms like sneezing and itching - Take at the same time each day - May cause drowsiness in some people - Once-daily dosing   Benadryl (diphenhydramine) Antihistamine that provides additional relief for breakthrough symptoms - Causes drowsiness - Use only at bedtime - For occasional use when needed   Saline Spray/Rinse Physically removes allergens and moistens nasal passages - Safe to use frequently - Improves effectiveness of other treatments - Reduces nasal irritation    CONTACT YOUR PROVIDER IF: Your symptoms do not improve after 1-2 weeks of following this plan You develop sinus pain with fever or green/yellow discharge You experience frequent nosebleeds You develop new or worsening symptoms You have questions about your treatment plan     ADDITIONAL ALLERGY MANAGEMENT TIPS   HELPFUL STRATEGIES: ?? Keep windows closed during high pollen seasons ??? Use allergen-proof covers for pillows and mattresses ?? Vacuum regularly with a HEPA filter vacuum ?? Shower and change clothes after spending time outdoors ?? Check local pollen counts and limit outdoor time when counts are high ?? Stay well-hydrated to help keep mucous membranes moist

## 2023-09-02 ENCOUNTER — Encounter: Payer: Self-pay | Admitting: Internal Medicine

## 2023-09-02 NOTE — Assessment & Plan Note (Signed)
 Opioid Dependence (Uncomplicated, secondary to chronic pain) Assessment: The patient has an expected physiological dependence on opioids resulting from necessary long-term treatment for severe pain. There is no evidence of misuse, addiction, or diversion; he has been fully compliant with all monitoring protocols. 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 Plan:  This is managed concurrently with his chronic pain. The plan includes continued risk mitigation (gradual taper, close monitoring) and documenting all efforts to transition his care to a pain medicine specialist, which is the appropriate standard of care.

## 2023-09-02 NOTE — Assessment & Plan Note (Signed)
 Assessment: The patient's life is dominated by severe, suboptimally controlled chronic pain stemming from extensive, objectively documented spine disease (cervical/lumbar stenosis, radiculopathy) and bilateral knee osteoarthritis. His pain is the primary driver of his disability and inability to maintain employment. Management is critically complicated by the loss of his pain specialist and significant, ongoing barriers to establishing care with a new specialist who accepts Medicaid. He remains compliant with a bridging opioid regimen, including random pill counts and a gradual, risk-mitigating taper (now at 80 tablets/month). Recent back injections provided only minimal, temporary relief, highlighting the severity of his underlying condition. Plan:  Pharmacologic Management:  Opioids: Reduced and refilled oxycodone -acetaminophen  (Percocet) 10-325 mg, #80 tablets for the month, to be taken every 8 hours as needed. This continues the gradual taper while providing a baseline level of analgesia necessary for function. Emphasized the risks of long-term use and the goal of transitioning to a specialist. Adjuncts: Continue his current multi-modal regimen of celecoxib , gabapentin , duloxetine , and topical diclofenac . Referral & Coordination: A new ambulatory referral will be sent to the [facility] pain clinic in a renewed attempt to establish specialty care for chronic opioid management. The ongoing difficulties will continue to be documented. Future Planning: Discussed the concept of a spinal cord stimulator as a potential future, opioid-sparing treatment modality that could be explored once he is established with a pain specialist.

## 2023-09-02 NOTE — Assessment & Plan Note (Signed)
 Asymptomatic Microscopic Hematuria & CKD Stage 2 Assessment: The patient has a longstanding history of persistent microscopic hematuria and a new diagnosis of Stage 2 Chronic Kidney Disease. His blood pressure is trending upward, which is a significant risk factor for the progression of kidney disease. A full urology workup has not been completed. Plan:  Monitoring: Defer immediate urology referral due to the patient's pressing financial crisis. Plan to recheck a renal panel and urinalysis in 3 months to monitor eGFR and hematuria. Management: The most important intervention at this stage is blood pressure control. Will monitor BP closely at future visits and will initiate a low-dose ACE inhibitor or ARB if it remains elevated, as this is protective for kidney function.

## 2023-09-02 NOTE — Assessment & Plan Note (Signed)
 Assessment: The patient is in a state of socioeconomic crisis. He is at imminent risk for homelessness (3 months behind on rent, large utility bill), has limited access to food, and lacks reliable phone service, which severely impedes communication and care coordination. These social determinants are not just background factors; they are active medical problems that directly impact his health, treatment adherence, and stress levels, thereby exacerbating his pain and other chronic conditions. Plan:  Continue to actively support his disability (SSDI) application by providing comprehensive medical summaries and documentation of his functional limitations. Maintain close coordination with his social worker to address the housing crisis and explore all available community resources. Medical decision-making will continue to prioritize the most urgent issues and defer non-critical evaluations (e.g., full urology workup) until his situation stabilizes.

## 2023-09-03 ENCOUNTER — Encounter: Admitting: Rehabilitative and Restorative Service Providers"

## 2023-09-04 ENCOUNTER — Encounter: Admitting: Rehabilitative and Restorative Service Providers"

## 2023-09-05 ENCOUNTER — Telehealth: Payer: Self-pay

## 2023-09-05 ENCOUNTER — Other Ambulatory Visit: Payer: Self-pay

## 2023-09-05 ENCOUNTER — Other Ambulatory Visit: Payer: Self-pay | Admitting: Licensed Clinical Social Worker

## 2023-09-05 DIAGNOSIS — G8929 Other chronic pain: Secondary | ICD-10-CM

## 2023-09-05 NOTE — Patient Instructions (Signed)
 Visit Information  Thank you for taking time to visit with me today. Please don't hesitate to contact me if I can be of assistance to you before our next scheduled telephone appointment.  Our next appointment is by telephone on 09/29/2023 at 2 PM  Following is a copy of your care plan:   Goals Addressed             This Visit's Progress    VBCI RN Care Plan       Problems:  Chronic Disease Management support and education needs related to Cervical and Lumbar Spinal Stenosis and Chronic Pain secondary to diagnosis Financial Constraints.  Goal: Over the next 3 months the Patient will continue to work with RN Care Manager and/or Social Worker to address care management and care coordination needs related to Cervical and Lumbar Spinal Stenosis as evidenced by adherence to CM Team Scheduled appointments      Interventions:   Coping strategies managing Spinal Stenosis  (Status:  Ongoing)  Long Term Goal Evaluation of current treatment plan related to Cervical and Lumbar Spinal Stenosis, Financial constraints related to inability to work and awaiting disability application decision due Sept 2025, Limited social support, and ADL IADL limitations self-management and patient's adherence to plan as established by provider. Discussed plans with patient for ongoing care management follow up and provided patient with direct contact information for care management team Evaluation of current treatment plan related to Spinal Stenosis and patient's adherence to plan as established by provider Social Work referral for financial challenges and exploring non-medication coping strategies tp deal with chronic intractable pain Discussed plans with patient for ongoing care management follow up and provided patient with direct contact information for care management team Screening for signs and symptoms of depression related to chronic disease state  Assessed social determinant of health barriers  Pain  Interventions:  (Status:  Ongoing) Short Term Goal Pain assessment performed Medications reviewed Reviewed provider established plan for pain management Discussed importance of adherence to all scheduled medical appointments Counseled on the importance of reporting any/all new or changed pain symptoms or management strategies to pain management provider Advised patient to report to care team affect of pain on daily activities Discussed use of relaxation techniques and/or diversional activities to assist with pain reduction (distraction, imagery, relaxation, massage, acupressure, TENS, heat, and cold application Reviewed with patient prescribed pharmacological and nonpharmacological pain relief strategies Screening for signs and symptoms of depression related to chronic disease state  Assessed social determinant of health barriers  Patient Self-Care Activities:  Attend all scheduled provider appointments Call pharmacy for medication refills 3-7 days in advance of running out of medications Call provider office for new concerns or questions  Perform all self care activities independently  Take medications as prescribed   Work with the social worker to address care coordination needs and will continue to work with the clinical team to address health care and disease management related needs  Plan:  The patient has been provided with contact information for the care management team and has been advised to call with any health related questions or concerns.  Next RN Case Management appt is 09/29/2023 at 2pm             Patient verbalizes understanding of instructions and care plan provided today and agrees to view in MyChart. Active MyChart status and patient understanding of how to access instructions and care plan via MyChart confirmed with patient.     The patient has been provided  with contact information for the care management team and has been advised to call with any health related  questions or concerns.   Please call the care guide team at [phone] if you need to cancel or reschedule your appointment.   Please call [phone]-TALK (toll free, 24 hour hotline) if you are experiencing a Mental Health or Behavioral Health Crisis or need someone to talk to.  Yohann Curl A. Gordy RN, BA, [facility], CRRN [place]  [facility] Population Health RN Care Manager Direct Dial: [phone]  Fax: [phone]
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
## 2023-09-05 NOTE — Telephone Encounter (Signed)
 Spoke with [facility] helps with referrals spoke with pt would like to got to [facility] due to other referral denial. Open a new referral up and put the pt request in.

## 2023-09-05 NOTE — Telephone Encounter (Signed)
 Tried to call Sheryl back no answer left message needing to know where referral needs to be sent to.  Copied from CRM [id number]. Topic: Referral - Question >> Sep 05, 2023 12:58 PM Chasity T wrote: Reason for CRM: Sheryl L calling from case manager with [facility] needs a new referral put in for patient due to recent denial at [facility]. Please contact her at [phone]

## 2023-09-05 NOTE — Patient Instructions (Signed)
 Visit Information  Thank you for taking time to visit with me today. Please don't hesitate to contact me if I can be of assistance to you before our next scheduled appointment.  Your next care management appointment is by telephone on 09/26/2023.   Please call the care guide team at [phone] if you need to cancel, schedule, or reschedule an appointment.   Please call the Suicide and Crisis Lifeline: 988 if you are experiencing a Mental Health or Behavioral Health Crisis or need someone to talk to.  Alm Armor, LCSW [place]/[facility], [facility] Licensed Clinical Social Worker Care Coordinator [phone]

## 2023-09-08 ENCOUNTER — Encounter: Admitting: Rehabilitative and Restorative Service Providers"

## 2023-09-11 ENCOUNTER — Encounter

## 2023-09-15 ENCOUNTER — Encounter: Admitting: Rehabilitative and Restorative Service Providers"

## 2023-09-16 ENCOUNTER — Telehealth: Payer: Self-pay

## 2023-09-16 NOTE — Telephone Encounter (Signed)
 Spoke with pt about declined  referral to [facility] pt stated he has not spoken to anyone form [facility] to decline referral.

## 2023-09-17 ENCOUNTER — Encounter: Admitting: Rehabilitative and Restorative Service Providers"

## 2023-09-24 ENCOUNTER — Encounter: Admitting: Rehabilitative and Restorative Service Providers"

## 2023-09-26 ENCOUNTER — Encounter: Admitting: Physical Therapy

## 2023-09-26 ENCOUNTER — Other Ambulatory Visit: Payer: Self-pay | Admitting: Licensed Clinical Social Worker
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
## 2023-09-26 NOTE — Patient Instructions (Signed)
 Visit Information  Thank you for taking time to visit with me today. Please don't hesitate to contact me if I can be of assistance to you before our next scheduled appointment.  Your next care management appointment is by telephone on 10/27/2023    Please call the care guide team at [phone] if you need to cancel, schedule, or reschedule an appointment.   Please call the Suicide and Crisis Lifeline: 988 if you are experiencing a Mental Health or Behavioral Health Crisis or need someone to talk to.  Alm Armor, LCSW [place]/[facility], [facility] Licensed Clinical Social Worker Care Coordinator [phone]

## 2023-09-29 ENCOUNTER — Other Ambulatory Visit: Payer: Self-pay

## 2023-09-29 NOTE — Patient Instructions (Signed)
 Visit Information  Thank you for taking time to visit with me today. Please don't hesitate to contact me if I can be of assistance to you before our next scheduled telephone appointment.  Our next appointment is by telephone on 10/29/23 at 2 pm  Following is a copy of your care plan:   Goals Addressed             This Visit's Progress    VBCI RN Care Plan       Problems:  Chronic Disease Management support and education needs related to Cervical and Lumbar Spinal Stenosis w/ related Chronic Lower Back Pain and right sided Sciatica Pain Financial Constraints.  Goal: Over the next 3 months the Patient will continue to work with RN Care Manager and/or Social Worker to address care management and care coordination needs related to Cervical and Lumbar Spinal Stenosis as evidenced by adherence to CM Team Scheduled appointments      Interventions:   Coping strategies managing Spinal Stenosis  (Status:  Ongoing)  Long Term Goal Evaluation of current treatment plan related to Cervical and Lumbar Spinal Stenosis, Financial constraints related to inability to work and awaiting disability application decision due Sept 2025, Limited social support, and ADL IADL limitations self-management and patient's adherence to plan as established by provider. Discussed plans with patient for ongoing care management follow up and provided patient with direct contact information for care management team Evaluation of current treatment plan related to Spinal Stenosis and patient's adherence to plan as established by provider Social Work referral for financial challenges and exploring non-medication coping strategies tp deal with chronic intractable pain Discussed plans with patient for ongoing care management follow up and provided patient with direct contact information for care management team Screening for signs and symptoms of depression related to chronic disease state  Assessed social determinant of health  barriers  Pain Interventions:  (Status:  Ongoing) Short Term Goal Pain assessment performed Medications reviewed Reviewed provider established plan for pain management Discussed importance of adherence to all scheduled medical appointments Counseled on the importance of reporting any/all new or changed pain symptoms or management strategies to pain management provider Advised patient to report to care team affect of pain on daily activities Discussed use of relaxation techniques and/or diversional activities to assist with pain reduction (distraction, imagery, relaxation, massage, acupressure, TENS, heat, and cold application Reviewed with patient prescribed pharmacological and nonpharmacological pain relief strategies Screening for signs and symptoms of depression related to chronic disease state  Assessed social determinant of health barriers  Patient Self-Care Activities:  Attend all scheduled provider appointments Call pharmacy for medication refills 3-7 days in advance of running out of medications Call provider office for new concerns or questions  Perform all self care activities independently  Take medications as prescribed   Work with the social worker to address care coordination needs and will continue to work with the clinical team to address health care and disease management related needs  Plan:  The patient has been provided with contact information for the care management team and has been advised to call with any health related questions or concerns.  Next RN Case Management appt is 10/29/2023 at 2pm             Patient verbalizes understanding of instructions and care plan provided today and agrees to view in MyChart. Active MyChart status and patient understanding of how to access instructions and care plan via MyChart confirmed with patient.  The patient has been provided with contact information for the care management team and has been advised to call with  any health related questions or concerns.   Please call the care guide team at [phone] if you need to cancel or reschedule your appointment.   Please call 1-800-273-TALK (toll free, 24 hour hotline) if you are experiencing a Mental Health or Behavioral Health Crisis or need someone to talk to.  Blonnie Maske A. Gordy RN, BA, [facility], CRRN [place]  [facility] Population Health RN Care Manager Direct Dial: [phone]  Fax: [phone]
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
## 2023-09-30 ENCOUNTER — Encounter

## 2023-10-02 ENCOUNTER — Encounter: Admitting: Rehabilitative and Restorative Service Providers"

## 2023-10-02 ENCOUNTER — Ambulatory Visit (INDEPENDENT_AMBULATORY_CARE_PROVIDER_SITE_OTHER): Admitting: Internal Medicine

## 2023-10-02 ENCOUNTER — Encounter: Payer: Self-pay | Admitting: Internal Medicine

## 2023-10-02 VITALS — BP 120/70 | HR 70 | Temp 98.2°F | Ht 72.0 in | Wt 188.8 lb

## 2023-10-02 DIAGNOSIS — F1120 Opioid dependence, uncomplicated: Secondary | ICD-10-CM | POA: Diagnosis not present

## 2023-10-02 DIAGNOSIS — M5441 Lumbago with sciatica, right side: Secondary | ICD-10-CM

## 2023-10-02 DIAGNOSIS — G8929 Other chronic pain: Secondary | ICD-10-CM | POA: Diagnosis not present

## 2023-10-02 DIAGNOSIS — M5412 Radiculopathy, cervical region: Secondary | ICD-10-CM

## 2023-10-02 DIAGNOSIS — F1129 Opioid dependence with unspecified opioid-induced disorder: Secondary | ICD-10-CM | POA: Diagnosis not present

## 2023-10-02 DIAGNOSIS — M549 Dorsalgia, unspecified: Secondary | ICD-10-CM | POA: Diagnosis not present

## 2023-10-02 DIAGNOSIS — M4802 Spinal stenosis, cervical region: Secondary | ICD-10-CM

## 2023-10-02 DIAGNOSIS — Z59811 Housing instability, housed, with risk of homelessness: Secondary | ICD-10-CM

## 2023-10-02 MED ORDER — OXYCODONE-ACETAMINOPHEN 10-325 MG PO TABS: 1.0000 | ORAL_TABLET | Freq: Three times a day (TID) | ORAL | 0 refills | Status: DC | PRN

## 2023-10-02 MED ORDER — JOURNAVX 50 MG PO TABS: 1.0000 | ORAL_TABLET | Freq: Two times a day (BID) | ORAL | 1 refills | Status: AC | PRN

## 2023-10-02 NOTE — Assessment & Plan Note (Signed)
 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 Pain management is difficult due to insurance limitations and limited access to a pain clinic. Current opioid therapy is necessary, but a pain specialist is needed for better management. Jurnavix, a new non-addictive pain medication, was discussed as a potential alternative, though its availability through Medicaid is uncertain. Continue current opioid prescription with monthly follow-up. Attempt to secure a referral to a pain clinic. Discussed and sent  Journavix with him.   Opioid dependence on long-term therapy   Long-term opioid therapy is used for chronic pain management, with a risk of dependence. He adheres to the prescribed regimen, but there is concern about legal implications without a pain specialist's oversight. He agreed to discontinue cannabis use to maintain opioid prescriptions. Continue opioid prescription with monthly monitoring. Encourage adherence to social work and pain clinic referrals to ensure continued access to opioids.  Housing instability   Ongoing housing instability is due to financial insecurity. He is working with a Child psychotherapist but faces challenges in securing stable housing. Various resources and strategies have been discussed to help reduce housing costs and improve stability. Provide information on housing resources and encourage contact. Encourage application for a housing voucher through the [employer].  Financial insecurity   Significant financial insecurity affects his ability to afford basic needs. He is on SSI and is exploring options to increase income without losing benefits. Strategies include exploring under-the-table work options and utilizing deductions for disability-related expenses. Encourage  exploration of under-the-table work options and deductions for disability-related expenses. Provide information on government programs and resources for financial stability.  Cannabis use (active, patient to discontinue)   Active cannabis use for pain management poses a legal risk with opioid prescriptions. He agreed to discontinue use to maintain opioid prescriptions. A drug screen is planned in two months to confirm cessation. Encourage discontinuation of cannabis use. Plan for a drug screen in two months to confirm cessation.  Recording duration: 34 minutes

## 2023-10-02 NOTE — Patient Instructions (Addendum)
 ?? Housing Assistance in [place] 1. Housing Choice Voucher (Section 8) Program Application Process: He can apply through the [employer] [facility]). Applications are accepted online via Rent Caf.  Eligibility: Eligibility is based on income and family size. A criminal background check is conducted; certain offenses may affect eligibility, but a felony conviction doesn't automatically disqualify an applicant. 2. [employer] [facility]) Services Offered: GHC provides housing counseling, eviction prevention, and assistance in finding affordable housing. (greensborohousingcoalition.org) Contact Information: He can reach them at [phone] or via email at info@gsohc .org.(greensborohousingcoalition.org) 3. Housing Connect [place] [employer] Assistance: For those interested in homeownership, [employer] [place] offers down payment assistance for first-time homebuyers. [facility])  ?? Employment and Income Support 1. Work Mining engineer) Services Provided: [employer] counselors offer free, individualized benefits counseling to help SSI recipients understand how work affects their benefits and explore work Insurance risk surveyor. (Red Cross DHHS) Contact Information: He can contact the [place]  Division of Vocational Rehabilitation Services at [phone] or [phone].([place] DHHS) 2. Ticket to Work [employer] Overview: This free program helps individuals receiving Social Security disability benefits find employment and achieve financial independence. (Wikipedia) Eligibility: Available to individuals aged 48 through 40 who receive SSDI or SSI.(Wikipedia) 3. Plan to Achieve Self-Support (PASS) Program Details: A PASS allows SSI recipients to set aside income and resources to achieve a specific work goal, such as starting a business or obtaining training. (soarworks.RockToxic.pl) Benefit: The funds set aside in a PASS do not count toward the SSI resource limit,  enabling individuals to save for their goals without affecting eligibility.(soarworks.samhsa.gov)  ?? Health and Pain Management Resources 1. Hub 4 Hope Services Offered: Hub 4 Hope connects individuals to various community resources, including healthcare, housing, and employment services. (Hub 4 Hope) Contact Information: He can visit their website at hub4hope.info for more information.(Hub 4 Hope) 2. Local Health Clinics Access to Care: Federally Qualified Health Centers [facility]) in [place] provide comprehensive healthcare services on a sliding fee scale based on income. Finding a Clinic: He can locate a nearby [facility] by visiting BestTheory.uy.  ?? Additional Support Services 1. Legal Aid of [place]  Services Provided: Legal Aid offers free legal assistance for low-income individuals, including help with housing issues and understanding SSI rules.  Contact Information: He can reach them at [phone].[facility]) 2. Administrator, sports Program Overview: This program provides employment training and placement services for individuals aged 45 and older. (Hub 4 Hope) Contact Information: He can contact Erminio Manna at [phone] for more information.(Hub 4 Hope)   It was a pleasure seeing you today! Your health and satisfaction are our top priorities.  [name], MD  VISIT SUMMARY: During your visit, we discussed your ongoing severe back pain and the challenges you face in managing it, including financial and housing instability. We reviewed your current medication regimen and explored potential alternatives. We also addressed your recent cough and your use of cannabis for pain management.  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 We will continue your current opioid prescription and attempt to secure a referral to a  pain clinic. We also discussed Jurnavix, a new non-addictive pain medication, though its availability through Medicaid is uncertain.  -OPIOID DEPENDENCE ON LONG-TERM THERAPY: Long-term opioid therapy for chronic pain management can lead to dependence. You are following your prescribed regimen, but it is important to have oversight from a pain specialist. We  will continue your opioid prescription with monthly monitoring and encourage you to follow up with social work and pain clinic referrals.  -HOUSING INSTABILITY: Housing instability means you are having trouble finding and keeping stable housing. You are working with a Child psychotherapist, and we discussed various resources and strategies to help reduce housing costs and improve stability. Please apply for a housing voucher through the [employer].  -FINANCIAL INSECURITY: Financial insecurity means you are having difficulty affording basic needs. You are on SSI and exploring options to increase your income without losing benefits. We discussed exploring under-the-table work options and utilizing deductions for disability-related expenses. Please look into government programs and resources for financial stability.  -CANNABIS USE (ACTIVE, PATIENT TO DISCONTINUE): Using cannabis for pain management can pose a legal risk with your opioid prescriptions. You agreed to discontinue use to maintain your opioid prescriptions. We will plan for a drug screen in two months to confirm cessation.  INSTRUCTIONS: Please follow up in one month for medication management and monitoring. Additionally, apply for a housing voucher through the [employer] and continue working with your social worker to secure stable housing and explore financial resources. Plan for a drug screen in two months to confirm cessation of cannabis use.  Your Providers PCP: Jesus Bernardino MATSU, MD,  [phone]) Referring Provider: Jesus Bernardino MATSU, MD,   [phone]) Care Team Provider: Georgina Ozell LABOR, MD,  [phone]) Care Team Provider: Burnetta Brunet, [place],  [phone]) Care Team Provider: Ermalene Ozell JINNY DEVONNA,  [phone]) Care Team Provider: Delene Thersia JINNY Care Team Provider: Mickiel Nest Elliott, NP-C,  [phone]) Care Team Provider: Gordy Channing LABOR, RN Care Team Provider: Eldonna Novel, MD,  [phone])  NEXT STEPS: [x]  Early Intervention: Schedule sooner appointment, call our on-call services, or go to emergency room if there is any significant Increase in pain or discomfort New or worsening symptoms Sudden or severe changes in your health [x]  Flexible Follow-Up: We recommend a No follow-ups on file. for optimal routine care. This allows for progress monitoring and treatment adjustments. [x]  Preventive Care: Schedule your annual preventive care visit! It's typically covered by insurance and helps identify potential health issues early. [x]  Lab & X-ray Appointments: Incomplete tests scheduled today, or call to schedule. X-rays: [facility] (M-F, 8:30am-noon or 1pm-5pm). [x]  Medical Information Release: Sign a release form at front desk to obtain relevant medical information we don't have.  MAKING THE MOST OF OUR FOCUSED 20 MINUTE APPOINTMENTS: [x]   Clearly state your top concerns at the beginning of the visit to focus our discussion [x]   If you anticipate you will need more time, please inform the front desk during scheduling - we can book multiple appointments in the same week. [x]   If you have transportation problems- use our convenient video appointments or ask about transportation support. [x]   We can get down to business faster if you use MyChart to update information before the visit and submit non-urgent questions before your visit. Thank you for taking the time to provide details through MyChart.  Let our nurse know and she can import this information into your encounter  documents.  Arrival and Wait Times: [x]   Arriving on time ensures that everyone receives prompt attention. [x]   Early morning (8a) and afternoon (1p) appointments tend to have shortest wait times. [x]   Unfortunately, we cannot delay appointments for late arrivals or hold slots during phone calls.  Getting Answers and Following Up [x]   Simple Questions & Concerns: For quick questions  or basic follow-up after your visit, reach us  at [phone] or MyChart messaging. [x]   Complex Concerns: If your concern is more complex, scheduling an appointment might be best. Discuss this with the staff to find the most suitable option. [x]   Lab & Imaging Results: We'll contact you directly if results are abnormal or you don't use MyChart. Most normal results will be on MyChart within 2-3 business days, with a review message from Dr. Jesus. Haven't heard back in 2 weeks? Need results sooner? Contact us  at [phone]. [x]   Referrals: Our referral coordinator will manage specialist referrals. The specialist's office should contact you within 2 weeks to schedule an appointment. Call us  if you haven't heard from them after 2 weeks.  Staying Connected [x]   MyChart: Activate your MyChart for the fastest way to access results and message us . See the last page of this paperwork for instructions on how to activate.  Bring to Your Next Appointment [x]   Medications: Please bring all your medication bottles to your next appointment to ensure we have an accurate record of your prescriptions. [x]   Health Diaries: If you're monitoring any health conditions at home, keeping a diary of your readings can be very helpful for discussions at your next appointment.  Billing [x]   X-ray & Lab Orders: These are billed by separate companies. Contact the invoicing company directly for questions or concerns. [x]   Visit Charges: Discuss any billing inquiries with our administrative services team.  Your Satisfaction Matters [x]    Share Your Experience: We strive for your satisfaction! If you have any complaints, or preferably compliments, please let Dr. Jesus know directly or contact our Practice Administrators, Manuelita Rubin or [employer], by asking at the front desk.   Reviewing Your Records [x]   Review this early draft of your clinical encounter notes below and the final encounter summary tomorrow on MyChart after its been completed.  All orders placed so far are visible here: Chronic intractable pain -     Journavx ; Take 1 tablet by mouth every 12 (twelve) hours as needed.  Dispense: 180 tablet; Refill: 1  Disabling back pain -     Journavx ; Take 1 tablet by mouth every 12 (twelve) hours as needed.  Dispense: 180 tablet; Refill: 1  Uncomplicated opioid dependence (HCC) -     Journavx ; Take 1 tablet by mouth every 12 (twelve) hours as needed.  Dispense: 180 tablet; Refill: 1  Housing instability, currently housed, at risk for homelessness -     Journavx ; Take 1 tablet by mouth every 12 (twelve) hours as needed.  Dispense: 180 tablet; Refill: 1  Opioid dependence with opioid-induced disorder (HCC) -     Journavx ; Take 1 tablet by mouth every 12 (twelve) hours as needed.  Dispense: 180 tablet; Refill: 1  Cervical radiculopathy -     oxyCODONE -Acetaminophen ; Take 1 tablet by mouth every 8 (eight) hours as needed for pain.  Dispense: 80 tablet; Refill: 0 -     Journavx ; Take 1 tablet by mouth every 12 (twelve) hours as needed.  Dispense: 180 tablet; Refill: 1  Cervical spinal stenosis -     oxyCODONE -Acetaminophen ; Take 1 tablet by mouth every 8 (eight) hours as needed for pain.  Dispense: 80 tablet; Refill: 0 -     Journavx ; Take 1 tablet by mouth every 12 (twelve) hours as needed.  Dispense: 180 tablet; Refill: 1  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-sided sciatica -     oxyCODONE -Acetaminophen ; Take 1 tablet by mouth  every 8 (eight) hours as needed for pain.  Dispense: 80 tablet; Refill: 0 -      Journavx ; Take 1 tablet by mouth every 12 (twelve) hours as needed.  Dispense: 180 tablet; Refill: 1
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
## 2023-10-06 ENCOUNTER — Encounter: Admitting: Physical Therapy

## 2023-10-09 ENCOUNTER — Encounter: Admitting: Rehabilitative and Restorative Service Providers"

## 2023-10-20 ENCOUNTER — Ambulatory Visit (INDEPENDENT_AMBULATORY_CARE_PROVIDER_SITE_OTHER): Admitting: Internal Medicine

## 2023-10-20 ENCOUNTER — Encounter: Payer: Self-pay | Admitting: Internal Medicine

## 2023-10-20 VITALS — BP 120/62 | HR 60 | Temp 98.2°F | Ht 72.0 in | Wt 194.6 lb

## 2023-10-20 DIAGNOSIS — N3001 Acute cystitis with hematuria: Secondary | ICD-10-CM | POA: Diagnosis not present

## 2023-10-20 DIAGNOSIS — N5089 Other specified disorders of the male genital organs: Secondary | ICD-10-CM | POA: Diagnosis not present

## 2023-10-20 DIAGNOSIS — Z59811 Housing instability, housed, with risk of homelessness: Secondary | ICD-10-CM

## 2023-10-20 DIAGNOSIS — R3121 Asymptomatic microscopic hematuria: Secondary | ICD-10-CM

## 2023-10-20 DIAGNOSIS — R350 Frequency of micturition: Secondary | ICD-10-CM | POA: Diagnosis not present

## 2023-10-20 DIAGNOSIS — Z599 Problem related to housing and economic circumstances, unspecified: Secondary | ICD-10-CM

## 2023-10-20 LAB — POC URINALSYSI DIPSTICK (AUTOMATED)
Bilirubin, UA: NEGATIVE
Glucose, UA: NEGATIVE
Ketones, UA: NEGATIVE
Nitrite, UA: NEGATIVE
Protein, UA: NEGATIVE
Spec Grav, UA: 1.025 (ref 1.010–1.025)
Urobilinogen, UA: 0.2 U/dL
pH, UA: 5.5 (ref 5.0–8.0)

## 2023-10-20 MED ORDER — CEFTRIAXONE SODIUM 500 MG IJ SOLR
500.0000 mg | Freq: Once | INTRAMUSCULAR | Status: AC
  Administered 2023-10-20: 500 mg via INTRAMUSCULAR

## 2023-10-20 NOTE — Assessment & Plan Note (Signed)
 [facility] placed prior and he has been following with  Counseled on how working impacts ssi benefits. Encouraged patient to work through the back pain and choose jobs easy on his back.

## 2023-10-20 NOTE — Assessment & Plan Note (Signed)
 Hematuria and abnormal urinalysis findings   Urinalysis shows 2+ blood, minimal bilirubin, pH 5.5, and leukocytes, indicating possible infection or urinary tract issues. No STIs detected; trichomonas testing is pending. He reports cloudy urine and hematuria. Order a comprehensive sexual health panel including trichomonas testing. Administer ceftriaxone  injection for potential infection. Send urine for further analysis.  Send urine for culture.

## 2023-10-20 NOTE — Assessment & Plan Note (Signed)
 Scrotal lump, evaluation in progress   He reports a scrotal lump, possibly above the testicles. A previous urology consultation was noted, but no scrotal ultrasound is on record. Further evaluation is needed to rule out prostate issues or cancer. Order a scrotal ultrasound and refer to urology for further evaluation, specifically to [facility] in [place].
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
## 2023-10-20 NOTE — Patient Instructions (Signed)
 It was a pleasure seeing you today! Your health and satisfaction are our top priorities.  Bryan Cone, MD  VISIT SUMMARY: During your visit, we discussed your concerns about abnormal urine color, potential urinary issues, and a lump in your scrotum. We reviewed your recent urinalysis results and discussed your history of back pain and your plans to return to work.  YOUR PLAN: -HEMATURIA AND ABNORMAL URINALYSIS FINDINGS: Hematuria means there is blood in your urine, which can be a sign of infection or other urinary tract issues. Your urinalysis showed blood, white blood cells, and a low pH, which suggests a possible infection. We have ordered a comprehensive sexual health panel, including a test for trichomonas, and administered a ceftriaxone  injection to treat any potential infection. Your urine will be sent for further analysis.  -SCROTAL LUMP, EVALUATION IN PROGRESS: A scrotal lump can be caused by various conditions, including infections, cysts, or tumors. You reported a lump above your testicles, and although you had a previous urology consultation, there is no record of a scrotal ultrasound. We have ordered a scrotal ultrasound and referred you to [facility] in [place] for further evaluation.  INSTRUCTIONS: Please follow up with the comprehensive sexual health panel results and the scrotal ultrasound. Attend your referral appointment with [facility] in [place] for further evaluation of the scrotal lump.  Your Providers PCP: Bryan Valdez MATSU, MD,  [phone]) Referring Provider: Cone Bryan MATSU, MD,  [phone]) Care Team Provider: Georgina Ozell LABOR, MD,  [phone]) Care Team Provider: Burnetta Brunet, [place],  [phone]) Care Team Provider: Ermalene Ozell JINNY DEVONNA,  [phone]) Care Team Provider: Delene Thersia JINNY Care Team Provider: Mickiel Nest Nadine, NP-C,  [phone]) Care Team Provider: Gordy Channing LABOR, RN Care Team Provider: Eldonna Novel, MD,  [phone])  NEXT STEPS: [x]  Early Intervention: Schedule sooner appointment, call our on-call services, or go to emergency room if there is any significant Increase in pain or discomfort New or worsening symptoms Sudden or severe changes in your health [x]  Flexible Follow-Up: We recommend a No follow-ups on file. for optimal routine care. This allows for progress monitoring and treatment adjustments. [x]  Preventive Care: Schedule your annual preventive care visit! It's typically covered by insurance and helps identify potential health issues early. [x]  Lab & X-ray Appointments: Incomplete tests scheduled today, or call to schedule. X-rays: [facility] [place] (M-F, 8:30am-noon or 1pm-5pm). [x]  Medical Information Release: Sign a release form at front desk to obtain relevant medical information we don't have.  MAKING THE MOST OF OUR FOCUSED 20 MINUTE APPOINTMENTS: [x]   Clearly state your top concerns at the beginning of the visit to focus our discussion [x]   If you anticipate you will need more time, please inform the front desk during scheduling - we can book multiple appointments in the same week. [x]   If you have transportation problems- use our convenient video appointments or ask about transportation support. [x]   We can get down to business faster if you use MyChart to update information before the visit and submit non-urgent questions before your visit. Thank you for taking the time to provide details through MyChart.  Let our nurse know and she can import this information into your encounter documents.  Arrival and Wait Times: [x]   Arriving on time ensures that everyone receives prompt attention. [x]   Early morning (8a) and afternoon (1p) appointments tend to have shortest wait times. [x]   Unfortunately, we cannot delay appointments for late arrivals or hold slots during phone calls.  Getting  Answers and Following Up [x]   Simple Questions & Concerns: For  quick questions or basic follow-up after your visit, reach us  at [phone] or MyChart messaging. [x]   Complex Concerns: If your concern is more complex, scheduling an appointment might be best. Discuss this with the staff to find the most suitable option. [x]   Lab & Imaging Results: We'll contact you directly if results are abnormal or you don't use MyChart. Most normal results will be on MyChart within 2-3 business days, with a review message from Dr. Jesus. Haven't heard back in 2 weeks? Need results sooner? Contact us  at [phone]. [x]   Referrals: Our referral coordinator will manage specialist referrals. The specialist's office should contact you within 2 weeks to schedule an appointment. Call us  if you haven't heard from them after 2 weeks.  Staying Connected [x]   MyChart: Activate your MyChart for the fastest way to access results and message us . See the last page of this paperwork for instructions on how to activate.  Bring to Your Next Appointment [x]   Medications: Please bring all your medication bottles to your next appointment to ensure we have an accurate record of your prescriptions. [x]   Health Diaries: If you're monitoring any health conditions at home, keeping a diary of your readings can be very helpful for discussions at your next appointment.  Billing [x]   X-ray & Lab Orders: These are billed by separate companies. Contact the invoicing company directly for questions or concerns. [x]   Visit Charges: Discuss any billing inquiries with our administrative services team.  Your Satisfaction Matters [x]   Share Your Experience: We strive for your satisfaction! If you have any complaints, or preferably compliments, please let Dr. Jesus know directly or contact our Practice Administrators, Manuelita Rubin or [employer], by asking at the front desk.   Reviewing Your Records [x]   Review this early draft of your clinical encounter notes below and the final encounter  summary tomorrow on MyChart after its been completed.  All orders placed so far are visible here: Frequency of urination -     POCT Urinalysis Dipstick (Automated) -     Urine Culture; Future -     Urine cytology ancillary only -     Urine Microscopic -     Protein / creatinine ratio, urine -     cefTRIAXone  Sodium -     US  RENAL; Future -     US  SCROTUM; Future  Acute cystitis with hematuria -     cefTRIAXone  Sodium -     US  RENAL; Future  Asymptomatic microscopic hematuria -     US  RENAL; Future  Scrotal mass  Housing instability, currently housed, at risk for homelessness  Financial difficulties

## 2023-10-22 ENCOUNTER — Ambulatory Visit
Admission: RE | Admit: 2023-10-22 | Discharge: 2023-10-22 | Disposition: A | Discharge status: Home / Self Care | Source: Ambulatory Visit | Attending: Internal Medicine | Admitting: Internal Medicine

## 2023-10-22 DIAGNOSIS — R3121 Asymptomatic microscopic hematuria: Secondary | ICD-10-CM

## 2023-10-22 DIAGNOSIS — R350 Frequency of micturition: Secondary | ICD-10-CM

## 2023-10-22 DIAGNOSIS — N3001 Acute cystitis with hematuria: Secondary | ICD-10-CM

## 2023-10-28 ENCOUNTER — Other Ambulatory Visit: Payer: Self-pay | Admitting: Licensed Clinical Social Worker

## 2023-10-28 ENCOUNTER — Ambulatory Visit: Payer: Self-pay | Admitting: Internal Medicine
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
## 2023-10-28 NOTE — Progress Notes (Signed)
 1.4 cm complex cyst within the left epididymis/epididymal patient is strongly advised to see urology. Coordinating with referral coordinator to facilitate appointment being arranged

## 2023-10-29 ENCOUNTER — Other Ambulatory Visit: Payer: Self-pay

## 2023-11-03 ENCOUNTER — Ambulatory Visit: Admitting: Internal Medicine

## 2023-11-03 ENCOUNTER — Encounter: Payer: Self-pay | Admitting: Internal Medicine

## 2023-11-03 VITALS — BP 120/70 | HR 76 | Temp 98.0°F | Ht 72.0 in | Wt 195.2 lb

## 2023-11-03 DIAGNOSIS — E782 Mixed hyperlipidemia: Secondary | ICD-10-CM | POA: Insufficient documentation

## 2023-11-03 DIAGNOSIS — G8929 Other chronic pain: Secondary | ICD-10-CM | POA: Diagnosis not present

## 2023-11-03 DIAGNOSIS — M545 Low back pain, unspecified: Secondary | ICD-10-CM | POA: Insufficient documentation

## 2023-11-03 DIAGNOSIS — N3001 Acute cystitis with hematuria: Secondary | ICD-10-CM | POA: Diagnosis not present

## 2023-11-03 DIAGNOSIS — M25551 Pain in right hip: Secondary | ICD-10-CM | POA: Insufficient documentation

## 2023-11-03 DIAGNOSIS — R7303 Prediabetes: Secondary | ICD-10-CM | POA: Insufficient documentation

## 2023-11-03 DIAGNOSIS — M5412 Radiculopathy, cervical region: Secondary | ICD-10-CM

## 2023-11-03 DIAGNOSIS — Z79899 Other long term (current) drug therapy: Secondary | ICD-10-CM

## 2023-11-03 DIAGNOSIS — M4802 Spinal stenosis, cervical region: Secondary | ICD-10-CM

## 2023-11-03 DIAGNOSIS — M4727 Other spondylosis with radiculopathy, lumbosacral region: Secondary | ICD-10-CM | POA: Insufficient documentation

## 2023-11-03 DIAGNOSIS — M47816 Spondylosis without myelopathy or radiculopathy, lumbar region: Secondary | ICD-10-CM | POA: Insufficient documentation

## 2023-11-03 DIAGNOSIS — G894 Chronic pain syndrome: Secondary | ICD-10-CM | POA: Insufficient documentation

## 2023-11-03 DIAGNOSIS — M2350 Chronic instability of knee, unspecified knee: Secondary | ICD-10-CM | POA: Insufficient documentation

## 2023-11-03 DIAGNOSIS — Z79891 Long term (current) use of opiate analgesic: Secondary | ICD-10-CM | POA: Insufficient documentation

## 2023-11-03 DIAGNOSIS — M5441 Lumbago with sciatica, right side: Secondary | ICD-10-CM

## 2023-11-03 DIAGNOSIS — M25552 Pain in left hip: Secondary | ICD-10-CM | POA: Insufficient documentation

## 2023-11-03 DIAGNOSIS — M48061 Spinal stenosis, lumbar region without neurogenic claudication: Secondary | ICD-10-CM

## 2023-11-03 DIAGNOSIS — M5430 Sciatica, unspecified side: Secondary | ICD-10-CM | POA: Insufficient documentation

## 2023-11-03 DIAGNOSIS — Z5181 Encounter for therapeutic drug level monitoring: Secondary | ICD-10-CM | POA: Insufficient documentation

## 2023-11-03 DIAGNOSIS — M549 Dorsalgia, unspecified: Secondary | ICD-10-CM

## 2023-11-03 DIAGNOSIS — M25559 Pain in unspecified hip: Secondary | ICD-10-CM | POA: Insufficient documentation

## 2023-11-03 DIAGNOSIS — N5089 Other specified disorders of the male genital organs: Secondary | ICD-10-CM

## 2023-11-03 DIAGNOSIS — F1120 Opioid dependence, uncomplicated: Secondary | ICD-10-CM

## 2023-11-03 DIAGNOSIS — M25562 Pain in left knee: Secondary | ICD-10-CM | POA: Insufficient documentation

## 2023-11-03 DIAGNOSIS — M25561 Pain in right knee: Secondary | ICD-10-CM | POA: Insufficient documentation

## 2023-11-03 DIAGNOSIS — Z599 Problem related to housing and economic circumstances, unspecified: Secondary | ICD-10-CM

## 2023-11-03 DIAGNOSIS — M170 Bilateral primary osteoarthritis of knee: Secondary | ICD-10-CM

## 2023-11-03 MED ORDER — OXYCODONE-ACETAMINOPHEN 10-325 MG PO TABS: 1.0000 | ORAL_TABLET | Freq: Three times a day (TID) | ORAL | 0 refills | Status: DC | PRN

## 2023-11-03 MED ORDER — DOXYCYCLINE HYCLATE 100 MG PO TABS: 100.0000 mg | ORAL_TABLET | Freq: Two times a day (BID) | ORAL | 0 refills | Status: DC
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
## 2023-11-03 NOTE — Assessment & Plan Note (Signed)
 Complex scrotal cyst with hematuria and urinary symptoms   He has a 1 cm complex scrotal cyst not directly on the testicle, accompanied by hematuria and urinary symptoms, including discharge. Malignancy is unlikely due to his age, but the complexity and symptoms necessitate further evaluation. Refer to urologist Dr. Mickiel for evaluation and management. Send urine sample and ultrasound results to Dr. Mickiel. Provide contact information for scheduling an appointment.  Doxycycline  prescribed

## 2023-11-03 NOTE — Progress Notes (Signed)
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 Urology    Referral Priority:   Routine    Referral Type:   Consultation    Referral Reason:   Specialty Services Required    Requested Specialty:   Urology    Number of Visits Requested:   1        This document was synthesized by artificial intelligence (Abridge) using HIPAA-compliant recording of the clinical interaction;   We discussed the use of AI scribe software for clinical note transcription with the patient, who gave verbal consent to proceed. additional Info: This encounter employed state-of-the-art, real-time, collaborative documentation. The patient actively reviewed and assisted in updating their electronic medical record on a shared screen, ensuring transparency and facilitating joint problem-solving for the problem list, overview, and plan. This approach promotes accurate, informed care. The treatment plan was discussed and reviewed in detail, including medication safety, potential side effects, and all patient questions. We confirmed understanding and  comfort with the plan. Follow-up instructions were established, including contacting the office for any concerns, returning if symptoms worsen, persist, or new symptoms develop, and precautions for potential emergency department visits.

## 2023-11-03 NOTE — Patient Instructions (Addendum)
 It was a pleasure seeing you today! Your health and satisfaction are our top priorities.  [name], MD  VISIT SUMMARY: Today, you were seen for a complex scrotal cyst and hematuria. We discussed your symptoms, including the presence of a cyst in your scrotum and blood in your urine. We also reviewed your chronic pain and the challenges you face in managing it due to insurance and financial issues.  YOUR PLAN: -COMPLEX SCROTAL CYST WITH HEMATURIA AND URINARY SYMPTOMS: You have a 1 cm cyst in your scrotum that is not on the testicle, along with blood in your urine and some discharge. While cancer is unlikely, we need to investigate further. You will be referred to a urologist, Dr. Mickiel, for further evaluation and management. We will send your urine sample and ultrasound results to Dr. Mickiel, and you will receive contact information to schedule an appointment.  -CHRONIC PAIN SYNDROME WITH OPIOID DEPENDENCE: You have chronic pain and are dependent on opioids for pain management. Due to your insurance and financial situation, it has been difficult to find a pain management provider. We will continue your current opioid prescription with weekly pill counts and submit new referrals for physical medicine and rehabilitation. Please answer any calls from potential new providers to help secure a pain management referral.  INSTRUCTIONS: Please follow up with Dr. Mickiel, the urologist, for further evaluation of your scrotal cyst and hematuria. Make sure to answer any calls from new providers regarding pain management referrals. Continue with your current medications and attend weekly pill counts.  Your Providers PCP: Cone Bernardino MATSU, MD,  [phone]) Referring Provider: Cone Bernardino MATSU, MD,  [phone]) Care Team Provider: Georgina Ozell LABOR, MD,  [phone]) Care Team Provider: Burnetta Brunet, [place],  [phone]) Care Team Provider: Ermalene Ozell JINNY DEVONNA,  [phone]) Care Team Provider:  Delene Thersia JINNY Care Team Provider: Mickiel [place], NP-C,  [phone]) Care Team Provider: Gordy Channing LABOR, RN Care Team Provider: Eldonna Novel, MD,  [phone])  NEXT STEPS: [x]  Early Intervention: Schedule sooner appointment, call our on-call services, or go to emergency room if there is any significant Increase in pain or discomfort New or worsening symptoms Sudden or severe changes in your health [x]  Flexible Follow-Up: We recommend a No follow-ups on file. for optimal routine care. This allows for progress monitoring and treatment adjustments. [x]  Preventive Care: Schedule your annual preventive care visit! It's typically covered by insurance and helps identify potential health issues early. [x]  Lab & X-ray Appointments: Incomplete tests scheduled today, or call to schedule. X-rays: [facility] (M-F, 8:30am-noon or 1pm-5pm). [x]  Medical Information Release: Sign a release form at front desk to obtain relevant medical information we don't have.  MAKING THE MOST OF OUR FOCUSED 20 MINUTE APPOINTMENTS: [x]   Clearly state your top concerns at the beginning of the visit to focus our discussion [x]   If you anticipate you will need more time, please inform the front desk during scheduling - we can book multiple appointments in the same week. [x]   If you have transportation problems- use our convenient video appointments or ask about transportation support. [x]   We can get down to business faster if you use MyChart to update information before the visit and submit non-urgent questions before your visit. Thank you for taking the time to provide details through MyChart.  Let our nurse know and she can import this information into your encounter documents.  Arrival and Wait Times: [x]   Arriving on time ensures that everyone receives  prompt attention. [x]   Early morning (8a) and afternoon (1p) appointments tend to have shortest wait times. [x]    Unfortunately, we cannot delay appointments for late arrivals or hold slots during phone calls.  Getting Answers and Following Up [x]   Simple Questions & Concerns: For quick questions or basic follow-up after your visit, reach us  at [phone] or MyChart messaging. [x]   Complex Concerns: If your concern is more complex, scheduling an appointment might be best. Discuss this with the staff to find the most suitable option. [x]   Lab & Imaging Results: We'll contact you directly if results are abnormal or you don't use MyChart. Most normal results will be on MyChart within 2-3 business days, with a review message from Dr. Jesus. Haven't heard back in 2 weeks? Need results sooner? Contact us  at [phone]. [x]   Referrals: Our referral coordinator will manage specialist referrals. The specialist's office should contact you within 2 weeks to schedule an appointment. Call us  if you haven't heard from them after 2 weeks.  Staying Connected [x]   MyChart: Activate your MyChart for the fastest way to access results and message us . See the last page of this paperwork for instructions on how to activate.  Bring to Your Next Appointment [x]   Medications: Please bring all your medication bottles to your next appointment to ensure we have an accurate record of your prescriptions. [x]   Health Diaries: If you're monitoring any health conditions at home, keeping a diary of your readings can be very helpful for discussions at your next appointment.  Billing [x]   X-ray & Lab Orders: These are billed by separate companies. Contact the invoicing company directly for questions or concerns. [x]   Visit Charges: Discuss any billing inquiries with our administrative services team.  Your Satisfaction Matters [x]   Share Your Experience: We strive for your satisfaction! If you have any complaints, or preferably compliments, please let Dr. Jesus know directly or contact our Practice Administrators, Manuelita Rubin  or [employer], by asking at the front desk.   Reviewing Your Records [x]   Review this early draft of your clinical encounter notes below and the final encounter summary tomorrow on MyChart after its been completed.  All orders placed so far are visible here: Acute cystitis with hematuria -     Ambulatory referral to Pain Clinic -     Ambulatory referral to Physical Medicine Rehab  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-sided sciatica -     Ambulatory referral to Pain Clinic -     Ambulatory referral to Physical Medicine Rehab  Cervical spinal stenosis -     Ambulatory referral to Pain Clinic -     Ambulatory referral to Physical Medicine Rehab -     oxyCODONE -Acetaminophen ; Take 1 tablet by mouth every 8 (eight) hours as needed for pain.  Dispense: 80 tablet; Refill: 0  Bilateral primary osteoarthritis of knee -     Ambulatory referral to Pain Clinic -     Ambulatory referral to Physical Medicine Rehab  Uncomplicated opioid dependence (HCC) -     Urinalysis w microscopic + reflex cultur -     Ambulatory referral to Pain Clinic -     Ambulatory referral to Physical Medicine Rehab  Scrotal mass -     Ambulatory referral to Urology  Spinal stenosis of lumbar region, unspecified whether neurogenic claudication present  Long term (current) use of opiate analgesic  Financial difficulties  Chronic intractable pain -     oxyCODONE -Acetaminophen ; Take 1 tablet by  mouth every 8 (eight) hours as needed for pain.  Dispense: 80 tablet; Refill: 0  Disabling back pain -     oxyCODONE -Acetaminophen ; Take 1 tablet by mouth every 8 (eight) hours as needed for pain.  Dispense: 80 tablet; Refill: 0  Cervical radiculopathy  High risk medication use  Other orders -     Doxycycline  Hyclate; Take 1 tablet (100 mg total) by mouth 2 (two) times daily.  Dispense: 14 tablet; Refill: 0      Team Member Role and Visual merchandiser Info Address Start End Comments  Tidwell, Delon Allis, NP-C Nurse Practitioner (Urology) Phone: [phone] Fax: [phone] [address] [place] [address] 06/27/2023 - -

## 2023-11-04 ENCOUNTER — Other Ambulatory Visit: Payer: Self-pay | Admitting: Internal Medicine

## 2023-11-04 DIAGNOSIS — M5412 Radiculopathy, cervical region: Secondary | ICD-10-CM

## 2023-11-04 DIAGNOSIS — M4802 Spinal stenosis, cervical region: Secondary | ICD-10-CM

## 2023-11-04 DIAGNOSIS — M549 Dorsalgia, unspecified: Secondary | ICD-10-CM

## 2023-11-04 DIAGNOSIS — G8929 Other chronic pain: Secondary | ICD-10-CM

## 2023-11-04 NOTE — Telephone Encounter (Signed)
 Copied from CRM [id number]. Topic: Clinical - Medication Refill >> Nov 04, 2023  8:17 AM Emylou G wrote: Medication: oxyCODONE -acetaminophen  (PERCOCET) 10-325 MG tablet  Has the patient contacted their pharmacy? Yes (Agent: If no, request that the patient contact the pharmacy for the refill. If patient does not wish to contact the pharmacy document the reason why and proceed with request.) (Agent: If yes, when and what did the pharmacy advise?) they said to call us .. also ins faxed over pa?  This is the patient's preferred pharmacy:  [id number] - [place], [place] - [address] AT [facility] [address] [place] [address] Phone: [phone] Fax: [phone]  Is this the correct pharmacy for this prescription? Yes If no, delete pharmacy and type the correct one.   Has the prescription been filled recently? No  Is the patient out of the medication? Yes  Has the patient been seen for an appointment in the last year OR does the patient have an upcoming appointment? Yes  Can we respond through MyChart? Yes  Agent: Please be advised that Rx refills may take up to 3 business days. We ask that you follow-up with your pharmacy.

## 2023-11-05 ENCOUNTER — Other Ambulatory Visit (HOSPITAL_COMMUNITY): Payer: Self-pay

## 2023-11-10 ENCOUNTER — Other Ambulatory Visit (HOSPITAL_COMMUNITY): Payer: Self-pay

## 2023-11-10 ENCOUNTER — Telehealth: Payer: Self-pay

## 2023-11-10 NOTE — Telephone Encounter (Signed)
 Spoke with pt advised him to go pick up his medication at the pharmacy the PA was approved today.

## 2023-11-10 NOTE — Telephone Encounter (Signed)
 Pharmacy Patient Advocate Encounter  Received notification from [facility] MEDICAID that Prior Authorization for Oxycodone /APAP 10/325mg  tabs has been APPROVED from 11/10/23 to 05/08/24   PA #/Case ID/Reference #: [id number]

## 2023-11-10 NOTE — Telephone Encounter (Signed)
 Copied from CRM #[id number]. Topic: Referral - Prior Authorization Question >> Nov 10, 2023 11:57 AM Rea ORN wrote: Reason for CRM: Pt called to follow up on prior auth for oxyCODONE -acetaminophen  (PERCOCET) 10-325 MG tablet.  Spoke with pt sent message to PA team to work on pt stated his insurance faxed over something we do not have it at this time. Will call pt back once PA team works on this

## 2023-11-10 NOTE — Telephone Encounter (Signed)
 Pharmacy Patient Advocate Encounter   Received notification from Physician's Office that prior authorization for oxyCODONE -Acetaminophen  10-325MG  tablets is required/requested.   Insurance verification completed.   The patient is insured through [facility] MEDICAID .   Per test claim: PA required; PA submitted to above mentioned insurance via Latent Key/confirmation #/EOC [id number] Status is pending

## 2023-11-12 NOTE — Telephone Encounter (Signed)
 Spoke with pharmacy about medication stated they have medication and will have it ready after 2 pm to pick up

## 2023-11-12 NOTE — Telephone Encounter (Signed)
 Pt called and stated that per the pharmacy, his PA for OXYCODONE  was not approved and its also out of stock there. He would like a callback with an update on the next steps byEOD. Please call and advise.

## 2023-11-14 ENCOUNTER — Encounter (HOSPITAL_COMMUNITY): Payer: Self-pay | Admitting: *Deleted

## 2023-11-14 ENCOUNTER — Other Ambulatory Visit: Payer: Self-pay

## 2023-11-14 ENCOUNTER — Ambulatory Visit: Payer: Self-pay

## 2023-11-14 ENCOUNTER — Emergency Department (HOSPITAL_COMMUNITY)
Admission: EM | Admit: 2023-11-14 | Discharge: 2023-11-15 | Disposition: A | Discharge status: Home / Self Care | Attending: Emergency Medicine | Admitting: Emergency Medicine

## 2023-11-14 DIAGNOSIS — M542 Cervicalgia: Secondary | ICD-10-CM | POA: Diagnosis present

## 2023-11-14 DIAGNOSIS — M25531 Pain in right wrist: Secondary | ICD-10-CM | POA: Insufficient documentation

## 2023-11-14 DIAGNOSIS — M545 Low back pain, unspecified: Secondary | ICD-10-CM | POA: Diagnosis not present

## 2023-11-14 DIAGNOSIS — Y92410 Unspecified street and highway as the place of occurrence of the external cause: Secondary | ICD-10-CM | POA: Insufficient documentation

## 2023-11-14 MED ORDER — OXYCODONE-ACETAMINOPHEN 5-325 MG PO TABS
1.0000 | ORAL_TABLET | Freq: Once | ORAL | Status: AC
  Administered 2023-11-14: 1 via ORAL

## 2023-11-14 MED ORDER — OXYCODONE-ACETAMINOPHEN 5-325 MG PO TABS
ORAL_TABLET | ORAL | Status: AC
  Filled 2023-11-14: qty 1
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
## 2023-11-15 ENCOUNTER — Emergency Department (HOSPITAL_COMMUNITY)

## 2023-11-15 NOTE — Discharge Instructions (Signed)
 It was a pleasure taking part in your care.  As discussed, your x-ray here are negative and show no fracture.  Please follow-up with your PCP.  Return to ED with new symptoms.
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
## 2023-11-17 ENCOUNTER — Ambulatory Visit: Payer: Self-pay

## 2023-11-17 NOTE — Telephone Encounter (Signed)
 FYI. Pt was advised to go to the ED
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
## 2023-11-20 ENCOUNTER — Telehealth: Payer: Self-pay

## 2023-11-20 NOTE — Telephone Encounter (Signed)
 Copied from CRM [id number]. Topic: Clinical - Medical Advice >> Nov 20, 2023 10:18 AM Harlene ORN wrote: Reason for CRM: Patient called to follow up with his PCP. His referral was not accepted because they did not accept his [facility] [employer]. Please call back the patient to discuss if he needs to be seen sooner than 11/27/2023 for his back and neck pain.  Spoke with pt urology does not take his insurance where else would you want to send pt please advise also having neck back pt I did advise pt to come in sooner if needed with pcp or another provider in the practice.

## 2023-11-24 ENCOUNTER — Encounter: Attending: Physical Medicine and Rehabilitation | Admitting: Physical Medicine and Rehabilitation

## 2023-11-24 VITALS — BP 132/83 | HR 57 | Ht 72.0 in | Wt 189.0 lb

## 2023-11-24 DIAGNOSIS — R159 Full incontinence of feces: Secondary | ICD-10-CM

## 2023-11-24 DIAGNOSIS — M25561 Pain in right knee: Secondary | ICD-10-CM | POA: Diagnosis present

## 2023-11-24 DIAGNOSIS — M778 Other enthesopathies, not elsewhere classified: Secondary | ICD-10-CM | POA: Insufficient documentation

## 2023-11-24 DIAGNOSIS — G894 Chronic pain syndrome: Secondary | ICD-10-CM | POA: Insufficient documentation

## 2023-11-24 DIAGNOSIS — M5441 Lumbago with sciatica, right side: Secondary | ICD-10-CM | POA: Diagnosis present

## 2023-11-24 DIAGNOSIS — Z91148 Patient's other noncompliance with medication regimen for other reason: Secondary | ICD-10-CM

## 2023-11-24 DIAGNOSIS — Z029 Encounter for administrative examinations, unspecified: Secondary | ICD-10-CM

## 2023-11-24 DIAGNOSIS — G4486 Cervicogenic headache: Secondary | ICD-10-CM | POA: Insufficient documentation

## 2023-11-24 DIAGNOSIS — M48062 Spinal stenosis, lumbar region with neurogenic claudication: Secondary | ICD-10-CM

## 2023-11-24 DIAGNOSIS — G8929 Other chronic pain: Secondary | ICD-10-CM | POA: Insufficient documentation

## 2023-11-24 DIAGNOSIS — M2170 Unequal limb length (acquired), unspecified site: Secondary | ICD-10-CM | POA: Insufficient documentation

## 2023-11-24 DIAGNOSIS — Z5181 Encounter for therapeutic drug level monitoring: Secondary | ICD-10-CM

## 2023-11-24 HISTORY — DX: Full incontinence of feces: R15.9

## 2023-11-24 HISTORY — DX: Patient's other noncompliance with medication regimen for other reason: Z91.148
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
## 2023-11-27 ENCOUNTER — Other Ambulatory Visit: Payer: Self-pay | Admitting: Licensed Clinical Social Worker

## 2023-11-27 NOTE — Patient Instructions (Signed)
 Visit Information  Thank you for taking time to visit with me today. Please don't hesitate to contact me if I can be of assistance to you before our next scheduled appointment.  Your next care management appointment is no further scheduled appointments.   Please call the care guide team at [phone] if you need to cancel, schedule, or reschedule an appointment.   Please call the Suicide and Crisis Lifeline: 988 if you are experiencing a Mental Health or Behavioral Health Crisis or need someone to talk to.  Hale Level, LCSW [facility], [facility] Licensed Clinical Social Worker Care Coordinator [phone]
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
## 2023-11-28 LAB — DRUG TOX MONITOR 1 W/CONF, ORAL FLD
Amphetamines: NEGATIVE ng/mL (ref <10)
Barbiturates: NEGATIVE ng/mL (ref <10)
Benzodiazepines: NEGATIVE ng/mL (ref <0.50)
Buprenorphine: NEGATIVE ng/mL (ref <0.10)
Cocaine: NEGATIVE ng/mL (ref <5.0)
Codeine: NEGATIVE ng/mL (ref <2.5)
Cotinine: 23.1 ng/mL — ABNORMAL HIGH (ref <5.0)
Dihydrocodeine: NEGATIVE ng/mL (ref <2.5)
Fentanyl: NEGATIVE ng/mL (ref <0.10)
Heroin Metabolite: NEGATIVE ng/mL (ref <1.0)
Hydrocodone: NEGATIVE ng/mL (ref <2.5)
Hydromorphone: NEGATIVE ng/mL (ref <2.5)
MARIJUANA: POSITIVE ng/mL — AB (ref <2.5)
MDMA: NEGATIVE ng/mL (ref <10)
Meprobamate: NEGATIVE ng/mL (ref <2.5)
Methadone: NEGATIVE ng/mL (ref <5.0)
Morphine: NEGATIVE ng/mL (ref <2.5)
Nicotine Metabolite: POSITIVE ng/mL — AB (ref <5.0)
Norhydrocodone: NEGATIVE ng/mL (ref <2.5)
Noroxycodone: NEGATIVE ng/mL (ref <2.5)
Opiates: POSITIVE ng/mL — AB (ref <2.5)
Oxycodone: 2.7 ng/mL — ABNORMAL HIGH (ref <2.5)
Oxymorphone: NEGATIVE ng/mL (ref <2.5)
Phencyclidine: NEGATIVE ng/mL (ref <10)
THC: 3.3 ng/mL — ABNORMAL HIGH (ref <2.5)
Tapentadol: NEGATIVE ng/mL (ref <5.0)
Tramadol: NEGATIVE ng/mL (ref <5.0)
Zolpidem: NEGATIVE ng/mL (ref <5.0)

## 2023-11-28 LAB — DRUG TOX ALC METAB W/CON, ORAL FLD: Alcohol Metabolite: NEGATIVE ng/mL (ref <25)

## 2023-12-01 ENCOUNTER — Ambulatory Visit: Payer: Self-pay | Admitting: Physical Medicine and Rehabilitation

## 2023-12-01 DIAGNOSIS — M48061 Spinal stenosis, lumbar region without neurogenic claudication: Secondary | ICD-10-CM

## 2023-12-01 DIAGNOSIS — M4802 Spinal stenosis, cervical region: Secondary | ICD-10-CM

## 2023-12-01 DIAGNOSIS — M549 Dorsalgia, unspecified: Secondary | ICD-10-CM

## 2023-12-01 DIAGNOSIS — G8929 Other chronic pain: Secondary | ICD-10-CM

## 2023-12-01 DIAGNOSIS — M48062 Spinal stenosis, lumbar region with neurogenic claudication: Secondary | ICD-10-CM

## 2023-12-01 NOTE — Progress Notes (Signed)
 How long after ingestion does marijuana test positive in the oral swabs? This gentleman has reported no marijuana use for almost 2 months; I expect it would still bne in a urine or hair sample but am unsure with the swab. Thank you!

## 2023-12-02 NOTE — Telephone Encounter (Signed)
 I spoke with Juanita from [facility] and she is going to have one of their physicians call me back about the + thc on the oral drug screen.

## 2023-12-03 ENCOUNTER — Ambulatory Visit (INDEPENDENT_AMBULATORY_CARE_PROVIDER_SITE_OTHER): Admitting: Internal Medicine

## 2023-12-03 ENCOUNTER — Encounter: Payer: Self-pay | Admitting: Internal Medicine

## 2023-12-03 ENCOUNTER — Telehealth: Payer: Self-pay

## 2023-12-03 VITALS — BP 134/80 | HR 65 | Temp 98.2°F | Ht 72.0 in | Wt 192.0 lb

## 2023-12-03 DIAGNOSIS — M549 Dorsalgia, unspecified: Secondary | ICD-10-CM

## 2023-12-03 DIAGNOSIS — G8929 Other chronic pain: Secondary | ICD-10-CM

## 2023-12-03 DIAGNOSIS — Z79899 Other long term (current) drug therapy: Secondary | ICD-10-CM

## 2023-12-03 DIAGNOSIS — G4486 Cervicogenic headache: Secondary | ICD-10-CM

## 2023-12-03 DIAGNOSIS — M48061 Spinal stenosis, lumbar region without neurogenic claudication: Secondary | ICD-10-CM | POA: Diagnosis not present

## 2023-12-03 DIAGNOSIS — F1120 Opioid dependence, uncomplicated: Secondary | ICD-10-CM | POA: Diagnosis not present

## 2023-12-03 DIAGNOSIS — M4727 Other spondylosis with radiculopathy, lumbosacral region: Secondary | ICD-10-CM

## 2023-12-03 DIAGNOSIS — M5441 Lumbago with sciatica, right side: Secondary | ICD-10-CM

## 2023-12-03 DIAGNOSIS — M47816 Spondylosis without myelopathy or radiculopathy, lumbar region: Secondary | ICD-10-CM | POA: Diagnosis not present

## 2023-12-03 DIAGNOSIS — M4802 Spinal stenosis, cervical region: Secondary | ICD-10-CM

## 2023-12-03 DIAGNOSIS — Z599 Problem related to housing and economic circumstances, unspecified: Secondary | ICD-10-CM

## 2023-12-03 DIAGNOSIS — M170 Bilateral primary osteoarthritis of knee: Secondary | ICD-10-CM

## 2023-12-03 DIAGNOSIS — Z59811 Housing instability, housed, with risk of homelessness: Secondary | ICD-10-CM

## 2023-12-03 MED ORDER — DULOXETINE HCL 60 MG PO CPEP: 60.0000 mg | ORAL_CAPSULE | Freq: Every day | ORAL | 4 refills | Status: DC

## 2023-12-03 MED ORDER — DULOXETINE HCL 30 MG PO CPEP: ORAL_CAPSULE | ORAL | 2 refills | Status: DC

## 2023-12-03 MED ORDER — OXYCODONE-ACETAMINOPHEN 10-325 MG PO TABS: 1.0000 | ORAL_TABLET | Freq: Three times a day (TID) | ORAL | 0 refills | Status: DC | PRN
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
## 2023-12-03 NOTE — Telephone Encounter (Signed)
 Copied from CRM [id number]. Topic: General - Other >> Dec 03, 2023 11:02 AM [place] J wrote: Reason for CRM: Bryan Valdez is calling from [facility] to let Dr. Jesus know that a prior authorization for the MR Lumbar Spine Wo Contrast and MR Cervical Spine Wo Contrast is needed for the patient. Patient is scheduled on the 15th for his imaging.

## 2023-12-03 NOTE — Assessment & Plan Note (Signed)
 Gave information and he has social work
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
## 2023-12-03 NOTE — Patient Instructions (Addendum)
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 to help buy food.    Soup Kitchens and [employer] in [place]: Free Indeed [employer] [phone] 4th Sa: 11a - 2p  Divine Intervention - Care Link [address] [phone] M - F: times and dates vary - call agency  [facility] of  [place]:  [address] [phone] a free community meal program along with other support services  [employer] [address] [phone] Provides hot meals, groceries, and clothing American Family Insurance Dish and [facility] Cendant Corporation [address] [phone] [email or web address] for mealtimes  [employer] Pantries in [place] Second [employer] of [place] : CarDumps.nl  keeps  an Environmental consultant of the food services regionally [place] of [place] [address] [place] [phone] Sa: 10a - 12p  on varying weeks; call agency for schedule  [facility] [address] [place] [phone] M - F: 9:30a - 3:30p, eligibility requirements  [facility] [address] [phone] Tu: 9a - 12p; Th 9a - 12p  Unice Sylvie Falconer of Praise [address] [phone] Tu : 10a - 12p; W: 10a - 12p; Th: 10a - 12p  [facility] [address] [phone] Tu: 9a - 11a; ThBETHA dinning - 11a  The [facility] [address] [phone] 1st & 3rd Sa: 9a - 12p  The [facility] [address] [place] [phone] by appointment Sa: 10:30a - 11:30a  Out of the [employer] [address] [place] [place] [phone] mobile distribution sites; call agency for listing  MBL-Out of the Garden [address] [place] [phone] mobile distribution sites; call agency for listing  MBL-Out of the Garden [address] [phone] mobile distribution sites; call agency for listing  [facility] [address] [phone] W: 9a - 12p  Caprice Cur[facility] [address] [phone] 1st & 3rd Tu: 10a - 1p; 3rd Sa: 10a - 12p  One Step Further [address] [employer] [address] [phone] M: 9:30a - 2:30p; Tu: 9:30a - 2:30p; W: 9:30a - 2:30p; Th: 9:30a - 2:30p  One Step Further [address] [phone] F: 11a - 2p  [facility]  [facility] [employer] [phone] [address]. [address] [phone] 2nd & 4th Th: 1p - 2:30p  [facility] [address] [place] [phone] T: 12:p -2p  Free Indeed Food Pantry [address] [phone] 3rd Sat of month: 11a-1p  [employer] Pant[employer] [address] [phone] 4th Sa: 9a - 11a  Divine Intervention - Care Link [address] [phone] CHRISTELLA - F: 9a - 5p  Malvina Crews - We Care Pantry [address] [phone] 3rd Tu: 5p - 7p; 3rd Sa: 9a - 1p  Willena Blackwood of Our Father VEATRICE Fabian Solon [phone] 2nd M 5:30p - 7:30p; each W 9:30a - 11:30a  [facility] [address] [phone] Tues 11a-1p call for appointment preferred  MBL - [facility] [address] [phone] mobile distribution sites; call agency for listing  Positive Direction for Youth & Families [address] [phone] M & W 6:30p - 8:30p  Pop up on Th 11a - 12:30p at different locations (call)  MBL-PD&Y [address] [phone] Thursdays 11a - 12p (please call for location)  [facility] Grocery GiveAway [address] [phone] weekly on W 9a-10:30a  [facility] [address] [phone] 4th Saturday: 10a - 12p  [facility] [address] Ent[address] [place] [phone] M-F 8am-5pm (by appt)  [facility] [address] [employer], [address] [place] [phone] M-F 8:30am-5:00pm (by appt)  [address] ODESSIA Hutchinson Food Pantry [address] [place] [phone] 2nd & 4th Saturday 11am-1pm  [facility] [address] [facility] [phone] Fridays 11am-2pm  [facility] Police Dept [address] [place] [phone] Call for schedule  [place] [place] [address] [phone], 3rd Thursday 2:30-3:30  [facility] [address] [phone] Thursdays 10am-12:30pm  [employer] [phone] Sa: 9a-12p (2nd and 4th Saturday of each month)  Bread of Life [address] [phone] of [place] [employer] [address] [phone]  Every third thursday  [facility] [address] [place] [phone]   [facility]  [address]  [phone].   Tuesdays from 5:30 PM to 7:30 PM and Saturdays from 10:00 AM to 12:00 PM.  Mobile Market (Mustard [facility]  [address] [phone] Food pantry is healthy food, they want to get appointments on healthy eating.   Mobile Food Pantries: Oceanographer (Out of the [employer]): Offers a Building services engineer with fresh produce, bread, meat, and non-perishable food. (Address: [address], [place], [place] - Phone number not available) [employer] Pantry [place], [place]): Wednesdays from 11:30 AM to 1:00 PM. Phone number: [phone] ([email or web address]) Bread of Life Food Bank ([place], [place]): Located at [address] [place], [place] [address]. Phone number: [phone]. Family Market Administrator, arts - [place], [place]): Located at [address] [place], [place] [address]. Phone number: [phone]. Tuesdays from 5:30 PM to 7:30 PM and Saturdays from 10:00 AM to 12:00 PM. Runner, broadcasting/film/video of [employer]: While not located in [place], they offer a Building services engineer program that may serve some [place] residents. Contact for details on eligibility and service areas. - Phone number: [phone] ([email or web address]/)

## 2023-12-04 ENCOUNTER — Other Ambulatory Visit: Payer: Self-pay

## 2023-12-04 NOTE — Patient Outreach (Signed)
 12/04/2023   Spoke briefly with patient today at the time of our scheduled appt. Reviewed patient's follow up with PCP, Dr. Jesus regarding patient's Lumbar Spondylosis, Lumbosacral Spondylosis w/ Radiculopathy, Spinal Stenosis of Lumbar region with need for continuing opioid therapy and review of opioid risks and ongoing education re safe opioid tx and Narcan availability.  Patient had previous appt and asked that our appt be as brief as possible. Next CCM follow up with RN Channing L is scheduled for 12/11/23 at 1pm

## 2023-12-04 NOTE — Patient Instructions (Signed)
 Visit Information  Thank you for taking time to visit with me today. Please don't hesitate to contact me if I can be of assistance to you before our next scheduled telephone appointment.  Our next appointment is by telephone on 12/11/23 at 1pm  Following is a copy of your care plan:   Goals Addressed             This Visit's Progress    VBCI RN Care Plan       Problems:  Chronic Disease Management support and education needs related to Cervical and Lumbar Spinal Stenosis w/ related Chronic Lower Back Pain and right sided Sciatica Pain Financial Constraints.  Goal: Over the next 3 months the Patient will continue to work with RN Care Manager and/or Social Worker to address care management and care coordination needs related to Cervical and Lumbar Spinal Stenosis as evidenced by adherence to CM Team Scheduled appointments      Interventions:   Coping strategies managing Spinal Stenosis  (Status:  Ongoing)  Long Term Goal Evaluation of current treatment plan related to Cervical and Lumbar Spinal Stenosis, Financial constraints related to inability to work and awaiting disability application decision due Sept 2025, Limited social support, and ADL IADL limitations self-management and patient's adherence to plan as established by provider. Discussed plans with patient for ongoing care management follow up and provided patient with direct contact information for care management team Evaluation of current treatment plan related to Spinal Stenosis and patient's adherence to plan as established by provider Social Work referral for financial challenges and exploring non-medication coping strategies tp deal with chronic intractable pain Discussed plans with patient for ongoing care management follow up and provided patient with direct contact information for care management team Screening for signs and symptoms of depression related to chronic disease state  Assessed social determinant of health  barriers  Pain Interventions:  (Status:  Ongoing) Short Term Goal Pain assessment performed Medications reviewed Reviewed provider established plan for pain management Discussed importance of adherence to all scheduled medical appointments Counseled on the importance of reporting any/all new or changed pain symptoms or management strategies to pain management provider Advised patient to report to care team affect of pain on daily activities Discussed use of relaxation techniques and/or diversional activities to assist with pain reduction (distraction, imagery, relaxation, massage, acupressure, TENS, heat, and cold application Reviewed with patient prescribed pharmacological and nonpharmacological pain relief strategies Screening for signs and symptoms of depression related to chronic disease state  Assessed social determinant of health barriers  Patient Self-Care Activities:  Attend all scheduled provider appointments Call pharmacy for medication refills 3-7 days in advance of running out of medications Call provider office for new concerns or questions  Perform all self care activities independently  Take medications as prescribed   Work with the social worker to address care coordination needs and will continue to work with the clinical team to address health care and disease management related needs  Plan:  The patient has been provided with contact information for the care management team and has been advised to call with any health related questions or concerns.  Next RN Case Management appt is 12/11/2023 at 1pm             Patient verbalizes understanding of instructions and care plan provided today and agrees to view in MyChart. Active MyChart status and patient understanding of how to access instructions and care plan via MyChart confirmed with patient.     The  patient has been provided with contact information for the care management team and has been advised to call with  any health related questions or concerns.   Please call the care guide team at [phone] if you need to cancel or reschedule your appointment.   Please call 1-800-273-TALK (toll free, 24 hour hotline) if you are experiencing a Mental Health or Behavioral Health Crisis or need someone to talk to.  Laniece Hornbaker A. Gordy RN, BA, [facility], CRRN [place]  [facility] Population Health RN Care Manager Direct Dial: [phone]  Fax: [phone]

## 2023-12-08 ENCOUNTER — Encounter: Payer: Self-pay | Admitting: Physical Medicine and Rehabilitation

## 2023-12-09 ENCOUNTER — Telehealth: Payer: Self-pay

## 2023-12-09 NOTE — Telephone Encounter (Signed)
 Copied from CRM [id number]. Topic: General - Other >> Dec 09, 2023  9:46 AM Bryan Valdez wrote: Patient calling to have more information on referral for MRI lumbar spine sent to insurance, insurance has denied the Mri, The appointment was cancelled for Oct 15th due to insurance deniel. And Insurance states they still need more information to prove need of MRI, patient requests pcp call insurance to see what is needed. Insurance is Well [employer]   Sent to referral team to work on

## 2023-12-10 ENCOUNTER — Other Ambulatory Visit

## 2023-12-11 ENCOUNTER — Other Ambulatory Visit: Payer: Self-pay

## 2023-12-18 ENCOUNTER — Ambulatory Visit
Admission: RE | Admit: 2023-12-18 | Discharge: 2023-12-18 | Disposition: A | Discharge status: Home / Self Care | Source: Ambulatory Visit | Attending: Physical Medicine and Rehabilitation | Admitting: Physical Medicine and Rehabilitation

## 2023-12-18 ENCOUNTER — Other Ambulatory Visit: Payer: Self-pay | Admitting: Physical Medicine and Rehabilitation

## 2023-12-18 ENCOUNTER — Inpatient Hospital Stay: Admission: RE | Admit: 2023-12-18 | Discharge: 2023-12-18 | Attending: Internal Medicine

## 2023-12-18 DIAGNOSIS — Z91148 Patient's other noncompliance with medication regimen for other reason: Secondary | ICD-10-CM

## 2023-12-18 DIAGNOSIS — Z029 Encounter for administrative examinations, unspecified: Secondary | ICD-10-CM

## 2023-12-18 DIAGNOSIS — Z59811 Housing instability, housed, with risk of homelessness: Secondary | ICD-10-CM

## 2023-12-18 DIAGNOSIS — R159 Full incontinence of feces: Secondary | ICD-10-CM

## 2023-12-18 DIAGNOSIS — M25561 Pain in right knee: Secondary | ICD-10-CM

## 2023-12-18 DIAGNOSIS — Z5181 Encounter for therapeutic drug level monitoring: Secondary | ICD-10-CM

## 2023-12-18 DIAGNOSIS — M778 Other enthesopathies, not elsewhere classified: Secondary | ICD-10-CM

## 2023-12-18 DIAGNOSIS — G8929 Other chronic pain: Secondary | ICD-10-CM

## 2023-12-18 DIAGNOSIS — M2170 Unequal limb length (acquired), unspecified site: Secondary | ICD-10-CM

## 2023-12-18 DIAGNOSIS — M47816 Spondylosis without myelopathy or radiculopathy, lumbar region: Secondary | ICD-10-CM

## 2023-12-18 DIAGNOSIS — G4486 Cervicogenic headache: Secondary | ICD-10-CM

## 2023-12-18 DIAGNOSIS — G894 Chronic pain syndrome: Secondary | ICD-10-CM

## 2023-12-18 DIAGNOSIS — Z79899 Other long term (current) drug therapy: Secondary | ICD-10-CM

## 2023-12-18 DIAGNOSIS — M48062 Spinal stenosis, lumbar region with neurogenic claudication: Secondary | ICD-10-CM
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
## 2023-12-20 ENCOUNTER — Ambulatory Visit: Payer: Self-pay | Admitting: Internal Medicine

## 2023-12-22 ENCOUNTER — Encounter: Attending: Physical Medicine and Rehabilitation | Admitting: Registered Nurse

## 2023-12-22 ENCOUNTER — Telehealth: Payer: Self-pay | Admitting: Registered Nurse

## 2023-12-22 ENCOUNTER — Encounter: Payer: Self-pay | Admitting: Registered Nurse

## 2023-12-22 VITALS — BP 138/92 | HR 64 | Ht 72.0 in | Wt 193.0 lb

## 2023-12-22 DIAGNOSIS — G8929 Other chronic pain: Secondary | ICD-10-CM | POA: Insufficient documentation

## 2023-12-22 DIAGNOSIS — M5441 Lumbago with sciatica, right side: Secondary | ICD-10-CM | POA: Insufficient documentation

## 2023-12-22 DIAGNOSIS — Z5181 Encounter for therapeutic drug level monitoring: Secondary | ICD-10-CM | POA: Insufficient documentation

## 2023-12-22 DIAGNOSIS — M25561 Pain in right knee: Secondary | ICD-10-CM | POA: Diagnosis present

## 2023-12-22 DIAGNOSIS — M542 Cervicalgia: Secondary | ICD-10-CM | POA: Diagnosis present

## 2023-12-22 DIAGNOSIS — G894 Chronic pain syndrome: Secondary | ICD-10-CM | POA: Diagnosis present

## 2023-12-22 DIAGNOSIS — M47812 Spondylosis without myelopathy or radiculopathy, cervical region: Secondary | ICD-10-CM | POA: Insufficient documentation

## 2023-12-22 DIAGNOSIS — Z79891 Long term (current) use of opiate analgesic: Secondary | ICD-10-CM | POA: Diagnosis present

## 2023-12-22 DIAGNOSIS — M546 Pain in thoracic spine: Secondary | ICD-10-CM | POA: Diagnosis not present

## 2023-12-22 NOTE — Telephone Encounter (Signed)
 Dr Emeline.  Bryan Valdez had his Lumbar MR  , he states he hasn't received a call from you regarding his results. He also stated he sees Dr Eldonna for his back injections, according to your note you mention Dr Carilyn regarding injection as well. He states he would like to see Dr Carilyn to discuss injection. I will have him schedule an appointment with Dr Carilyn, to discuss injection. I will await your input regarding the above.

## 2023-12-22 NOTE — Telephone Encounter (Signed)
 read by [name] at 9:15PM on 12/20/2023.

## 2023-12-23 NOTE — Telephone Encounter (Signed)
 Strangely, it looks like the results of his Lumbar MRI were sent to Dr. Jesus and not me; sorry that I didn't know they came back. It showed severe spinal canal stenosis at L4-5,  along with multiple levels of moerate and severe neuroforaminal stenosis. First order of business would be an epidural, but if unsuccessful or if his symptoms get worse he will need to see a neurosurgeon.   His cervical MRI results did come to me, which show severe degenerative disc disease and multiple levels. He could be a candidate for facet blocks / RFA, which Dr Eldonna could do, but Dr Carilyn does not.   Please reach out with any further questions!

## 2023-12-25 ENCOUNTER — Telehealth: Payer: Self-pay | Admitting: Registered Nurse

## 2023-12-25 LAB — DRUG TOX MONITOR 1 W/CONF, ORAL FLD
Amphetamines: NEGATIVE ng/mL (ref <10)
Barbiturates: NEGATIVE ng/mL (ref <10)
Benzodiazepines: NEGATIVE ng/mL (ref <0.50)
Buprenorphine: NEGATIVE ng/mL (ref <0.10)
Cocaine: NEGATIVE ng/mL (ref <5.0)
Codeine: NEGATIVE ng/mL (ref <2.5)
Cotinine: 66.2 ng/mL — ABNORMAL HIGH (ref <5.0)
Dihydrocodeine: NEGATIVE ng/mL (ref <2.5)
Fentanyl: NEGATIVE ng/mL (ref <0.10)
Heroin Metabolite: NEGATIVE ng/mL (ref <1.0)
Hydrocodone: NEGATIVE ng/mL (ref <2.5)
Hydromorphone: NEGATIVE ng/mL (ref <2.5)
MARIJUANA: NEGATIVE ng/mL (ref <2.5)
MDMA: NEGATIVE ng/mL (ref <10)
Meprobamate: NEGATIVE ng/mL (ref <2.5)
Methadone: NEGATIVE ng/mL (ref <5.0)
Morphine: NEGATIVE ng/mL (ref <2.5)
Nicotine Metabolite: POSITIVE ng/mL — AB (ref <5.0)
Norhydrocodone: NEGATIVE ng/mL (ref <2.5)
Noroxycodone: NEGATIVE ng/mL (ref <2.5)
Opiates: POSITIVE ng/mL — AB (ref <2.5)
Oxycodone: 4.8 ng/mL — ABNORMAL HIGH (ref <2.5)
Oxymorphone: NEGATIVE ng/mL (ref <2.5)
Phencyclidine: NEGATIVE ng/mL (ref <10)
Tapentadol: NEGATIVE ng/mL (ref <5.0)
Tramadol: NEGATIVE ng/mL (ref <5.0)
Zolpidem: NEGATIVE ng/mL (ref <5.0)

## 2023-12-25 LAB — DRUG TOX ALC METAB W/CON, ORAL FLD: Alcohol Metabolite: NEGATIVE ng/mL (ref <25)

## 2023-12-25 NOTE — Telephone Encounter (Signed)
 Call placed to Mr. Siler,regarding Dr Emeline recommendations no answer. His voicemail is not set up.  Unable to leave a message.

## 2023-12-26 ENCOUNTER — Telehealth: Payer: Self-pay | Admitting: Family Medicine

## 2023-12-26 NOTE — Telephone Encounter (Signed)
 Pt called stating his MRI sent in referral for him to have another MRI and pt and pt lawyer office asking for Dr Georgina to compare the two MRI he has had. Please call pt at [phone].

## 2023-12-29 ENCOUNTER — Encounter: Payer: Self-pay | Admitting: Radiology

## 2023-12-31 NOTE — Telephone Encounter (Signed)
 Sent My-Chart message

## 2024-01-05 ENCOUNTER — Ambulatory Visit (INDEPENDENT_AMBULATORY_CARE_PROVIDER_SITE_OTHER): Admitting: Internal Medicine

## 2024-01-05 ENCOUNTER — Encounter: Payer: Self-pay | Admitting: Internal Medicine

## 2024-01-05 ENCOUNTER — Other Ambulatory Visit (HOSPITAL_COMMUNITY)
Admission: RE | Admit: 2024-01-05 | Discharge: 2024-01-05 | Disposition: A | Discharge status: Home / Self Care | Source: Ambulatory Visit | Attending: Internal Medicine | Admitting: Internal Medicine

## 2024-01-05 ENCOUNTER — Other Ambulatory Visit: Payer: Self-pay

## 2024-01-05 VITALS — BP 130/78 | HR 72 | Temp 98.2°F | Ht 72.0 in | Wt 192.3 lb

## 2024-01-05 DIAGNOSIS — E782 Mixed hyperlipidemia: Secondary | ICD-10-CM

## 2024-01-05 DIAGNOSIS — G8929 Other chronic pain: Secondary | ICD-10-CM

## 2024-01-05 DIAGNOSIS — N5089 Other specified disorders of the male genital organs: Secondary | ICD-10-CM | POA: Insufficient documentation

## 2024-01-05 DIAGNOSIS — L729 Follicular cyst of the skin and subcutaneous tissue, unspecified: Secondary | ICD-10-CM

## 2024-01-05 DIAGNOSIS — M4802 Spinal stenosis, cervical region: Secondary | ICD-10-CM

## 2024-01-05 DIAGNOSIS — R3121 Asymptomatic microscopic hematuria: Secondary | ICD-10-CM

## 2024-01-05 DIAGNOSIS — M546 Pain in thoracic spine: Secondary | ICD-10-CM

## 2024-01-05 DIAGNOSIS — M5432 Sciatica, left side: Secondary | ICD-10-CM

## 2024-01-05 DIAGNOSIS — E559 Vitamin D deficiency, unspecified: Secondary | ICD-10-CM | POA: Diagnosis not present

## 2024-01-05 DIAGNOSIS — E7800 Pure hypercholesterolemia, unspecified: Secondary | ICD-10-CM

## 2024-01-05 DIAGNOSIS — M5431 Sciatica, right side: Secondary | ICD-10-CM

## 2024-01-05 DIAGNOSIS — R7303 Prediabetes: Secondary | ICD-10-CM

## 2024-01-05 MED ORDER — ROSUVASTATIN CALCIUM 10 MG PO TABS
10.0000 mg | ORAL_TABLET | Freq: Every day | ORAL | 3 refills | Status: AC
Start: 2024-01-05 — End: ?

## 2024-01-05 MED ORDER — VITAMIN D (ERGOCALCIFEROL) 1.25 MG (50000 UNIT) PO CAPS: 50000.0000 [IU] | ORAL_CAPSULE | ORAL | 1 refills | Status: AC
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
## 2024-01-05 NOTE — Assessment & Plan Note (Signed)
 Borderline elevated cholesterol levels with a 10-year heart attack risk of 7.5%. Discussed potential benefits of statin therapy to reduce cardiovascular risk and informed about potential side effects, including muscle cramps in 10% of patients. Prescribed rosuvastatin at a low dose to manage cholesterol levels.

## 2024-01-05 NOTE — Patient Instructions (Signed)
 Visit Information  Thank you for taking time to visit with me today. Please don't hesitate to contact me if I can be of assistance to you before our next scheduled telephone appointment.  Our next appointment is by telephone on 01/12/24 at 2pm  Following is a copy of your care plan:   Goals Addressed             This Visit's Progress    VBCI RN Care Plan       Problems:  Chronic Disease Management support and education needs related to Cervical and Lumbar Spinal Stenosis w/ related Chronic Lower Back Pain and right sided Sciatica Pain Financial Constraints.  Goal: Over the next 3 months the Patient will continue to work with RN Care Manager and/or Social Worker to address care management and care coordination needs related to Cervical and Lumbar Spinal Stenosis as evidenced by adherence to CM Team Scheduled appointments      Interventions:   Coping strategies managing Spinal Stenosis  (Status:  Ongoing)  Long Term Goal Evaluation of current treatment plan related to Cervical and Lumbar Spinal Stenosis, Financial constraints related to inability to work and awaiting disability application decision due Sept 2025, Limited social support, and ADL IADL limitations self-management and patient's adherence to plan as established by provider. Discussed plans with patient for ongoing care management follow up and provided patient with direct contact information for care management team Evaluation of current treatment plan related to Spinal Stenosis and patient's adherence to plan as established by provider Social Work referral for financial challenges and exploring non-medication coping strategies tp deal with chronic intractable pain Discussed plans with patient for ongoing care management follow up and provided patient with direct contact information for care management team Screening for signs and symptoms of depression related to chronic disease state  Assessed social determinant of health  barriers  Pain Interventions:  (Status:  Ongoing) Short Term Goal Pain assessment performed Medications reviewed Reviewed provider established plan for pain management Discussed importance of adherence to all scheduled medical appointments Counseled on the importance of reporting any/all new or changed pain symptoms or management strategies to pain management provider Advised patient to report to care team affect of pain on daily activities Discussed use of relaxation techniques and/or diversional activities to assist with pain reduction (distraction, imagery, relaxation, massage, acupressure, TENS, heat, and cold application Reviewed with patient prescribed pharmacological and nonpharmacological pain relief strategies Screening for signs and symptoms of depression related to chronic disease state  Assessed social determinant of health barriers  Patient Self-Care Activities:  Attend all scheduled provider appointments Call pharmacy for medication refills 3-7 days in advance of running out of medications Call provider office for new concerns or questions  Perform all self care activities independently  Take medications as prescribed   Work with the social worker to address care coordination needs and will continue to work with the clinical team to address health care and disease management related needs  Plan:  The patient has been provided with contact information for the care management team and has been advised to call with any health related questions or concerns.  Next RN Case Management appt is 01/12/2024 at 2pm             Care plan and visit instructions communicated with the patient verbally today. Patient agrees to receive a copy in MyChart. Active MyChart status and patient understanding of how to access instructions and care plan via MyChart confirmed with patient.  The patient has been provided with contact information for the care management team and has been  advised to call with any health related questions or concerns.   Please call the care guide team at [phone] if you need to cancel or reschedule your appointment.   Please call 1-800-273-TALK (toll free, 24 hour hotline) if you are experiencing a Mental Health or Behavioral Health Crisis or need someone to talk to.  Sevag Shearn A. Gordy RN, BA, [facility], CRRN [place]  [facility] Population Health RN Care Manager Direct Dial: [phone]  Fax: [phone]

## 2024-01-05 NOTE — Assessment & Plan Note (Signed)
 Persistent scrotal swelling with microscopic hematuria : 09/2023 ultrasound showed complex cyst but urology referral has not materialized.  Persistent scrotal swelling with occasional hematuria is noted, with a previous ultrasound showing a complex cyst. No urology follow-up has occurred yet. He is re referred to urology for evaluation and a urine analysis is ordered to check for sexually transmitted infections and hematuria
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
## 2024-01-06 ENCOUNTER — Ambulatory Visit: Payer: Self-pay | Admitting: Internal Medicine

## 2024-01-06 DIAGNOSIS — A599 Trichomoniasis, unspecified: Secondary | ICD-10-CM

## 2024-01-06 LAB — URINE CYTOLOGY ANCILLARY ONLY
Chlamydia: NEGATIVE
Comment: NEGATIVE
Comment: NEGATIVE
Comment: NORMAL
Neisseria Gonorrhea: NEGATIVE
Trichomonas: POSITIVE — AB

## 2024-01-06 MED ORDER — METRONIDAZOLE 500 MG PO TABS: 2000.0000 mg | ORAL_TABLET | Freq: Once | ORAL | 0 refills | Status: AC

## 2024-01-07 LAB — URINALYSIS W MICROSCOPIC + REFLEX CULTURE
Bacteria, UA: NONE SEEN /HPF
Bilirubin Urine: NEGATIVE
Glucose, UA: NEGATIVE
Hyaline Cast: NONE SEEN /LPF
Ketones, ur: NEGATIVE
Nitrites, Initial: NEGATIVE
Protein, ur: NEGATIVE
RBC / HPF: NONE SEEN /HPF (ref 0–2)
Specific Gravity, Urine: 1.014 (ref 1.001–1.035)
Squamous Epithelial / HPF: NONE SEEN /HPF (ref <=5)
pH: 6.5 (ref 5.0–8.0)

## 2024-01-07 LAB — URINE CULTURE
MICRO NUMBER:: 17216305
Result:: NO GROWTH
SPECIMEN QUALITY:: ADEQUATE

## 2024-01-07 LAB — CULTURE INDICATED

## 2024-01-12 ENCOUNTER — Other Ambulatory Visit: Payer: Self-pay

## 2024-01-13 ENCOUNTER — Other Ambulatory Visit: Payer: Self-pay

## 2024-01-26 ENCOUNTER — Telehealth: Payer: Self-pay | Admitting: Registered Nurse

## 2024-01-26 DIAGNOSIS — M48061 Spinal stenosis, lumbar region without neurogenic claudication: Secondary | ICD-10-CM

## 2024-01-26 DIAGNOSIS — M549 Dorsalgia, unspecified: Secondary | ICD-10-CM

## 2024-01-26 DIAGNOSIS — M4802 Spinal stenosis, cervical region: Secondary | ICD-10-CM

## 2024-01-26 DIAGNOSIS — G8929 Other chronic pain: Secondary | ICD-10-CM

## 2024-01-26 MED ORDER — OXYCODONE-ACETAMINOPHEN 10-325 MG PO TABS: 1.0000 | ORAL_TABLET | Freq: Three times a day (TID) | ORAL | 0 refills | Status: DC | PRN

## 2024-01-26 NOTE — Telephone Encounter (Signed)
 Patient called stating his medication was not put in last week and he wanted to call and let us  know he is waiting fir a Oxycodone  refill.

## 2024-01-26 NOTE — Telephone Encounter (Signed)
 Refilled percocet; See my last visit note, we were to pick up this script after Dr. Justus last refill in November. Gave 1 month given follow up with Fidela coming up. Thanks!

## 2024-01-26 NOTE — Telephone Encounter (Signed)
 Refill request for Oxycodone . According to his med list another doctor refilled last. Not sure if you ae taking over.

## 2024-01-29 ENCOUNTER — Encounter: Payer: Self-pay | Admitting: Orthopedic Surgery

## 2024-01-29 ENCOUNTER — Other Ambulatory Visit: Payer: Self-pay

## 2024-01-29 ENCOUNTER — Ambulatory Visit: Admitting: Orthopedic Surgery

## 2024-01-29 DIAGNOSIS — M542 Cervicalgia: Secondary | ICD-10-CM

## 2024-01-29 DIAGNOSIS — M25552 Pain in left hip: Secondary | ICD-10-CM

## 2024-01-29 DIAGNOSIS — M5412 Radiculopathy, cervical region: Secondary | ICD-10-CM
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
## 2024-02-03 ENCOUNTER — Ambulatory Visit: Admitting: Sports Medicine

## 2024-02-03 ENCOUNTER — Other Ambulatory Visit: Payer: Self-pay

## 2024-02-03 ENCOUNTER — Encounter: Payer: Self-pay | Admitting: Sports Medicine

## 2024-02-03 DIAGNOSIS — M542 Cervicalgia: Secondary | ICD-10-CM

## 2024-02-03 DIAGNOSIS — M1612 Unilateral primary osteoarthritis, left hip: Secondary | ICD-10-CM

## 2024-02-03 DIAGNOSIS — G8929 Other chronic pain: Secondary | ICD-10-CM

## 2024-02-03 MED ORDER — LIDOCAINE HCL 1 % IJ SOLN
4.0000 mL | INTRAMUSCULAR | Status: AC | PRN
  Administered 2024-02-03: 4 mL

## 2024-02-03 MED ORDER — METHYLPREDNISOLONE ACETATE 40 MG/ML IJ SUSP
80.0000 mg | INTRAMUSCULAR | Status: AC | PRN
  Administered 2024-02-03: 80 mg via INTRA_ARTICULAR
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
## 2024-02-03 NOTE — Addendum Note (Signed)
 Addended by: Atwood Adcock W III on: 02/03/2024 12:28 PM   Modules accepted: Level of Service

## 2024-02-06 ENCOUNTER — Inpatient Hospital Stay: Admission: RE | Admit: 2024-02-06 | Discharge: 2024-02-06 | Attending: Orthopedic Surgery

## 2024-02-06 DIAGNOSIS — M5412 Radiculopathy, cervical region: Secondary | ICD-10-CM

## 2024-02-10 ENCOUNTER — Encounter: Payer: Self-pay | Admitting: Registered Nurse

## 2024-02-10 ENCOUNTER — Encounter: Attending: Physical Medicine and Rehabilitation | Admitting: Registered Nurse

## 2024-02-10 VITALS — BP 124/83 | HR 68 | Ht 72.0 in | Wt 189.0 lb

## 2024-02-10 DIAGNOSIS — M48061 Spinal stenosis, lumbar region without neurogenic claudication: Secondary | ICD-10-CM | POA: Insufficient documentation

## 2024-02-10 DIAGNOSIS — M5416 Radiculopathy, lumbar region: Secondary | ICD-10-CM | POA: Insufficient documentation

## 2024-02-10 DIAGNOSIS — Z5181 Encounter for therapeutic drug level monitoring: Secondary | ICD-10-CM

## 2024-02-10 DIAGNOSIS — G894 Chronic pain syndrome: Secondary | ICD-10-CM

## 2024-02-10 DIAGNOSIS — M5412 Radiculopathy, cervical region: Secondary | ICD-10-CM

## 2024-02-10 DIAGNOSIS — M542 Cervicalgia: Secondary | ICD-10-CM

## 2024-02-10 DIAGNOSIS — G8929 Other chronic pain: Secondary | ICD-10-CM | POA: Insufficient documentation

## 2024-02-10 DIAGNOSIS — Z79891 Long term (current) use of opiate analgesic: Secondary | ICD-10-CM | POA: Diagnosis not present

## 2024-02-10 DIAGNOSIS — M546 Pain in thoracic spine: Secondary | ICD-10-CM | POA: Diagnosis not present

## 2024-02-10 DIAGNOSIS — M47816 Spondylosis without myelopathy or radiculopathy, lumbar region: Secondary | ICD-10-CM | POA: Insufficient documentation

## 2024-02-10 MED ORDER — OXYCODONE-ACETAMINOPHEN 10-325 MG PO TABS: 1.0000 | ORAL_TABLET | Freq: Three times a day (TID) | ORAL | 0 refills | Status: DC | PRN

## 2024-02-10 MED ORDER — OXYCODONE-ACETAMINOPHEN 10-325 MG PO TABS: 1.0000 | ORAL_TABLET | Freq: Three times a day (TID) | ORAL | 0 refills | Status: AC | PRN
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
## 2024-02-10 NOTE — Addendum Note (Signed)
 Addended by: EMELINE SEARCH on: 02/10/2024 04:00 PM   Modules accepted: Orders

## 2024-02-17 ENCOUNTER — Encounter: Admitting: Physical Medicine & Rehabilitation

## 2024-02-17 ENCOUNTER — Encounter: Payer: Self-pay | Admitting: Physical Medicine & Rehabilitation

## 2024-02-17 VITALS — BP 120/76 | HR 56 | Ht 72.0 in | Wt 193.0 lb

## 2024-02-17 DIAGNOSIS — Z79891 Long term (current) use of opiate analgesic: Secondary | ICD-10-CM | POA: Diagnosis present

## 2024-02-17 DIAGNOSIS — M542 Cervicalgia: Secondary | ICD-10-CM | POA: Diagnosis present

## 2024-02-17 DIAGNOSIS — Z5181 Encounter for therapeutic drug level monitoring: Secondary | ICD-10-CM | POA: Diagnosis present

## 2024-02-17 DIAGNOSIS — M546 Pain in thoracic spine: Secondary | ICD-10-CM | POA: Diagnosis present

## 2024-02-17 DIAGNOSIS — G894 Chronic pain syndrome: Secondary | ICD-10-CM | POA: Diagnosis present

## 2024-02-17 DIAGNOSIS — M47816 Spondylosis without myelopathy or radiculopathy, lumbar region: Secondary | ICD-10-CM

## 2024-02-17 DIAGNOSIS — M48061 Spinal stenosis, lumbar region without neurogenic claudication: Secondary | ICD-10-CM | POA: Diagnosis present

## 2024-02-17 DIAGNOSIS — M5416 Radiculopathy, lumbar region: Secondary | ICD-10-CM | POA: Diagnosis present

## 2024-02-17 DIAGNOSIS — G8929 Other chronic pain: Secondary | ICD-10-CM | POA: Diagnosis present

## 2024-02-17 DIAGNOSIS — M5412 Radiculopathy, cervical region: Secondary | ICD-10-CM | POA: Diagnosis present
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
## 2024-02-17 NOTE — Patient Instructions (Signed)
" °  VISIT SUMMARY: You visited us  today to discuss your persistent back and neck pain and to review your previous injection therapies. We talked about your chronic low back pain and the limited relief you've experienced from past treatments.  YOUR PLAN: CHRONIC LOW BACK PAIN WITH LUMBAR FACET ARTHROPATHY: You have chronic low back pain due to lumbar facet arthropathy, which has not responded well to previous treatments. -We discussed using medial branch blocks to diagnose if your pain is coming from the facet joints. -If you get significant temporary relief (80% or more) from two sets of medial branch blocks, we will consider radiofrequency ablation for longer-term pain relief. -We have scheduled your medial branch block injections for the end of next month or the beginning of the following month, after your cervical injection.                      Contains text generated by Abridge.                                 Contains text generated by Abridge.   "

## 2024-02-22 ENCOUNTER — Encounter: Payer: Self-pay | Admitting: Registered Nurse

## 2024-03-02 ENCOUNTER — Other Ambulatory Visit

## 2024-03-02 ENCOUNTER — Other Ambulatory Visit: Payer: Self-pay
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
## 2024-03-02 NOTE — Patient Instructions (Signed)
 Visit Information  [name] was given information about Medicaid Managed Care team care coordination services as a part of their [facility] Medicaid benefit.   If you would like to schedule transportation through your [facility] plan, please call the following number at least 2 days in advance of your appointment: [phone].   You can also use the MTM portal or MTM mobile app to manage your rides. Reimbursement for transportation is available through [facility]! For the portal, please go to [email or web address]  Call the [facility] Crisis Line at [phone], at any time, 24 hours a day, 7 days a week. If you are in danger or need immediate medical attention call 911.  Please see education materials related to chronic pain provided by MyChart link.  Care plan and visit instructions communicated with the patient verbally today. Patient agrees to receive a copy in MyChart. Active MyChart status and patient understanding of how to access instructions and care plan via MyChart confirmed with patient.     The Managed Medicaid care management team will reach out to the patient again over the next 30 days.  Telephone follow up appointment with Managed Medicaid care management team member scheduled for:03/31/24 at 1 pm.  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  Goal: Over the next 3 months the Patient will attend all scheduled medical appointments: pertaining to health maintenance as evidenced by patient report and/or chart review.        continue to work with Medical Illustrator and/or Social  Worker to address care management and care coordination needs related to Cervical and Lumbar Spinal Stenosis/chronic pain as evidenced by adherence to care management team scheduled appointments     take all medications exactly as prescribed and will call provider for medication related questions as evidenced by patient report and/or chart review.    verbalize understanding of plan for management of chronic pain as evidenced by attending all appointments and taking all medications as prescribed.   Interventions:   Coping strategies managing Spinal Stenosis  (Status:  Ongoing)  Long Term Goal Evaluation of current treatment plan related to Cervical and Lumbar Spinal Stenosisself-management and patient's adherence to plan as established by provider. Discussed plans with patient for ongoing care management follow up and provided patient with direct contact information for care management team Evaluation of current treatment plan related to Spinal Stenosis and patient's adherence to plan as established by provider Discussed plans with patient for ongoing care management follow up and provided patient with direct contact information for care management team Screening for signs and symptoms of depression related to chronic disease state  Assessed social determinant of health barriers  Pain Interventions:  (Status:  Ongoing) Short Term Goal Pain assessment performed Medications reviewed Reviewed provider established plan for pain management Discussed importance of adherence to all scheduled medical appointments Counseled on the importance of reporting any/all new or changed pain symptoms or management strategies to pain management provider Advised patient to report to care team affect of pain on daily activities Discussed use of relaxation  techniques and/or diversional activities to assist with pain reduction (distraction, imagery, relaxation, massage, acupressure, TENS, heat, and cold  application Reviewed with patient prescribed pharmacological and nonpharmacological pain relief strategies Screening for signs and symptoms of depression related to chronic disease state  Assessed social determinant of health barriers  Patient Self-Care Activities:  Attend all scheduled provider appointments Call pharmacy for medication refills 3-7 days in advance of running out of medications Call provider office for new concerns or questions  Perform all self care activities independently  Perform IADL's (shopping, preparing meals, housekeeping, managing finances) independently Take medications as prescribed   Work with the social worker to address care coordination needs and will continue to work with the clinical team to address health care and disease management related needs  Plan:  The patient has been provided with contact information for the care management team and has been advised to call with any health related questions or concerns.  Next RN Case Management appointment scheduled for 03/31/24 at 1 pm.

## 2024-03-08 ENCOUNTER — Ambulatory Visit: Admitting: Physical Medicine and Rehabilitation

## 2024-03-08 ENCOUNTER — Other Ambulatory Visit: Payer: Self-pay

## 2024-03-08 ENCOUNTER — Ambulatory Visit (INDEPENDENT_AMBULATORY_CARE_PROVIDER_SITE_OTHER): Admitting: Internal Medicine

## 2024-03-08 ENCOUNTER — Ambulatory Visit: Admitting: Orthopedic Surgery

## 2024-03-08 ENCOUNTER — Encounter: Payer: Self-pay | Admitting: Internal Medicine

## 2024-03-08 ENCOUNTER — Other Ambulatory Visit (HOSPITAL_COMMUNITY)
Admission: RE | Admit: 2024-03-08 | Discharge: 2024-03-08 | Disposition: A | Discharge status: Home / Self Care | Source: Ambulatory Visit | Attending: Internal Medicine | Admitting: Internal Medicine

## 2024-03-08 ENCOUNTER — Ambulatory Visit: Payer: Self-pay

## 2024-03-08 VITALS — BP 132/78 | HR 67

## 2024-03-08 VITALS — BP 130/74 | HR 76 | Temp 98.0°F | Ht 72.0 in | Wt 193.4 lb

## 2024-03-08 DIAGNOSIS — M4810 Ankylosing hyperostosis [Forestier], site unspecified: Secondary | ICD-10-CM | POA: Insufficient documentation

## 2024-03-08 DIAGNOSIS — R310 Gross hematuria: Secondary | ICD-10-CM

## 2024-03-08 DIAGNOSIS — M25562 Pain in left knee: Secondary | ICD-10-CM | POA: Diagnosis not present

## 2024-03-08 DIAGNOSIS — E559 Vitamin D deficiency, unspecified: Secondary | ICD-10-CM | POA: Insufficient documentation

## 2024-03-08 DIAGNOSIS — G8929 Other chronic pain: Secondary | ICD-10-CM

## 2024-03-08 DIAGNOSIS — R8290 Unspecified abnormal findings in urine: Secondary | ICD-10-CM | POA: Diagnosis not present

## 2024-03-08 DIAGNOSIS — M25561 Pain in right knee: Secondary | ICD-10-CM

## 2024-03-08 DIAGNOSIS — M5412 Radiculopathy, cervical region: Secondary | ICD-10-CM | POA: Diagnosis not present

## 2024-03-08 DIAGNOSIS — M5441 Lumbago with sciatica, right side: Secondary | ICD-10-CM

## 2024-03-08 MED ORDER — VITAMIN K2-VITAMIN D3 90-125 MCG PO CAPS: 1.0000 | ORAL_CAPSULE | Freq: Every day | ORAL | 4 refills | Status: AC

## 2024-03-08 MED ORDER — METHYLPREDNISOLONE ACETATE 40 MG/ML IJ SUSP
40.0000 mg | Freq: Once | INTRAMUSCULAR | Status: AC
  Administered 2024-03-08: 40 mg

## 2024-03-08 NOTE — Assessment & Plan Note (Signed)
 He has experienced recent onset of gross hematuria since Friday, with blood appearing at the end of urination. Differential diagnosis includes infection or other causes. Symptoms have worsened since his previous urologist visit on January 7th. Ordered urinalysis to check for infection and other causes of hematuria.

## 2024-03-08 NOTE — Procedures (Signed)
 Cervical Epidural Steroid Injection - Interlaminar Approach with Fluoroscopic Guidance  Patient: Bryan Valdez      Date of Birth: 04-20-1965 MRN: [id number] PCP: Jesus Bernardino MATSU, MD      Visit Date: 03/08/2024   Universal Protocol:    Date/Time: 1/12/20263:17 PM  Consent Given By: the patient  Position: PRONE  Additional Comments: Vital signs were monitored before and after the procedure. Patient was prepped and draped in the usual sterile fashion. The correct patient, procedure, and site was verified.   Injection Procedure Details:   Procedure diagnoses: Cervical radiculopathy [M54.12]    Meds Administered:  Meds ordered this encounter  Medications   methylPREDNISolone  acetate (DEPO-MEDROL ) injection 40 mg     Laterality: Right  Location/Site: C7-T1  Needle: 3.5 in., 20 ga. Tuohy  Needle Placement: Paramedian epidural space  Findings:  -Comments: Excellent flow of contrast into the epidural space.  Procedure Details: Using a paramedian approach from the side mentioned above, the region overlying the inferior lamina was localized under fluoroscopic visualization and the soft tissues overlying this structure were infiltrated with 4 ml. of 1% Lidocaine  without Epinephrine. A # 20 gauge, Tuohy needle was inserted into the epidural space using a paramedian approach.  The epidural space was localized using loss of resistance along with contralateral oblique bi-planar fluoroscopic views.  After negative aspirate for air, blood, and CSF, a 2 ml. volume of Isovue-250 was injected into the epidural space and the flow of contrast was observed. Radiographs were obtained for documentation purposes.   The injectate was administered into the level noted above.  Additional Comments:  The patient tolerated the procedure well Dressing: 2 x 2 sterile gauze and Band-Aid    Post-procedure details: Patient was observed during the procedure. Post-procedure instructions were  reviewed.  Patient left the clinic in stable condition.

## 2024-03-08 NOTE — Assessment & Plan Note (Signed)
 He was previously diagnosed with vitamin D  deficiency and is currently out of vitamin D  supplements. Prescribed vitamin D  supplements.
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
## 2024-03-08 NOTE — Progress Notes (Signed)
 Orthopedic Spine Surgery Office Note   Assessment: Patient is a 59 y.o. male with neck pain that radiates into the bilateral shoulders and lateral arms to the level of the elbows, suspect radiculopathy. Has bilateral knee pain as well.      Plan: -Discussed ACDF as a treatment option for him. With his distribution of pain, and areas with stenosis, talked about a C4/5 and C5/6 ACDF based on his distribution of pain. I covered the risks, benefits, and alternatives with him. I told him that neck pain would not be reliably relieved with this surgery but arm pain is more predictably relieved. He wanted to think about it some more and talk about it with his girlfriend. He told me he will let me know how he would like to proceed -Patient was interested in repeat knee injections. He has had these before and they help with his knee pain. This was done today in the office today. See procedure note below.  -Patient should return to office on an as needed basis     Bilateral knee injectio note: After discussing the risks, benefits, alternatives of bilateral knee injection, patient elected to proceed.  Patient was in the seated position with the knees at 90 degrees.  The anterolateral soft spot over the left knee was prepped with an alcohol-based prep.  Ethyl chloride was used anesthetize the skin.  A 20-gauge needle was used to inject 1 cc of lidocaine , 1 cc of bupivacaine, 1 cc of Depo-Medrol  into the intra-articular space under standard sterile technique.  Needle was withdrawn and Band-Aid was applied.  The same injection was then performed on the right side with the same medications.  Patient tolerated the procedure well.   ___________________________________________________________________________     History:   Patient is a 59 y.o. male who presents today for follow up on his cervical spine and bilateral knee pain. Patient continues to have neck pain that radiates into the bilateral upper extremities.  He  feels the pain starts in his neck and goes into the bilateral posterior scapula, lateral shoulders, and arms to the level of the elbows.  Sometimes radiate just past the elbow.  He has not noticed significant improvement since he was last seen in the office.  He still using Percocet because the pain is severe.  He has not noticed any significant change in his symptoms since he was last seen in the office.  Patient comes in today with bilateral knee pain.  He has had prior injections of the knees.  He thinks the injections are not helpful and give him good relief for several months.  He is interested in repeating injections today     Treatments tried: Tylenol , Celebrex , Cymbalta , Flexeril , Percocet     Physical Exam:   General: no acute distress, appears stated age Neurologic: alert, answering questions appropriately, following commands Respiratory: unlabored breathing on room air, symmetric chest rise Psychiatric: appropriate affect, normal cadence to speech     MSK (spine):   -Strength exam                                                   Left                  Right Grip strength  5/5                  5/5 Interosseus                  5/5                  5/5 Wrist extension            5/5                  5/5 Wrist flexion                 5/5                  5/5 Elbow flexion                5/5                  5/5 Deltoid                          5/5                  5/5   -Sensory exam                           Sensation intact to light touch in C5-T1 nerve distributions of bilateral upper extremities     Imaging: XRs of the cervical spine from 01/29/2024 were previously independently reviewed and interpreted, showing disc height loss with anterior osteophyte formation at C4/5.  Anterior osteophyte formation seen at C5/6 and C6/7.  There appears to be autofusion anteriorly at C5/6.  No evidence of instability on flexion/extension views.  No fracture or dislocation  seen.  XRs of the left knee from 03/08/2024 were reviewed and interpreted, showing small anterior osteophyte formation in the patellofemoral compartment.  No fracture or dislocation seen.  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 No significant central stenosis. No T2 cord signal change.      Patient name: Bryan Valdez Patient MRN: [id number] Date of visit: 03/08/2024

## 2024-03-08 NOTE — Progress Notes (Signed)
 Pain Scale   Average Pain 7 Patient advising he has chronic neck pain that radiates bilaterally to both arms, pain is chronic        +Driver, -BT, -Dye Allergies.
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
## 2024-03-09 ENCOUNTER — Ambulatory Visit: Payer: Self-pay | Admitting: Internal Medicine

## 2024-03-09 LAB — URINE CYTOLOGY ANCILLARY ONLY
Chlamydia: NEGATIVE
Comment: NEGATIVE
Comment: NEGATIVE
Comment: NORMAL
Neisseria Gonorrhea: NEGATIVE
Trichomonas: NEGATIVE

## 2024-03-09 LAB — URINALYSIS W MICROSCOPIC + REFLEX CULTURE
Bacteria, UA: NONE SEEN /HPF
Bilirubin Urine: NEGATIVE
Glucose, UA: NEGATIVE
Hyaline Cast: NONE SEEN /LPF
Ketones, ur: NEGATIVE
Leukocyte Esterase: NEGATIVE
Nitrites, Initial: NEGATIVE
Protein, ur: NEGATIVE
Specific Gravity, Urine: 1.019 (ref 1.001–1.035)
Squamous Epithelial / HPF: NONE SEEN /HPF
pH: 6 (ref 5.0–8.0)

## 2024-03-09 LAB — NO CULTURE INDICATED

## 2024-03-31 ENCOUNTER — Other Ambulatory Visit: Payer: Self-pay

## 2024-03-31 NOTE — Patient Outreach (Signed)
 03/31/2024   Monthly F/U - spoke to patient regarding effectiveness of recent steroid injections received a couple of weeks ago, received a cervical epidural steroid injection for his cervical pain and also discussed undergoing a possible ACDF (C4/5 + C5/6) with Dr. Eldonna. Patient also received bilateral steroid injections to his knees and has an appointment tomorrow, 04/01/24 to receive bilateral L3-4-5 injections. Patient continues to see a Pain Management specialist and continues on opiates prescribed by Pain Management for pain relief. Patient states has no pressing issues at this time.  Next appointment with RN Care Manager is 04/29/2024 at [facility] A. Gordy RN, BA, [facility], CRRN   [facility] Population Health RN Care Manager Direct Dial: [phone]  Fax: [phone]

## 2024-03-31 NOTE — Patient Instructions (Signed)
 Visit Information  Thank you for taking time to visit with me today. Please don't hesitate to contact me if I can be of assistance to you before our next scheduled telephone appointment.  Our next appointment is by telephone on 04/29/2024 at 1pm  Following is a copy of your care plan:   Goals Addressed             This Visit's Progress    VBCI RN Care Plan       Problems:  Chronic Disease Management support and education needs related to Cervical and Lumbar Spinal Stenosis w/ related Chronic Lower Back Pain and right sided Sciatica Pain Financial Constraints.  Goal: Over the next 4 months the Patient will attend all scheduled medical appointments: pertaining to health maintenance as evidenced by patient report and/or chart review.        continue to work with Medical Illustrator and/or Social Worker to address care management and care coordination needs related to Cervical and Lumbar Spinal Stenosis/chronic pain as evidenced by adherence to care management team scheduled appointments     take all medications exactly as prescribed and will call provider for medication related questions as evidenced by patient report and/or chart review.    verbalize understanding of plan for management of chronic pain as evidenced by attending all appointments and taking all medications as prescribed.   Interventions:   Coping strategies managing Spinal Stenosis  (Status:  Ongoing)  Long Term Goal Evaluation of current treatment plan related to Cervical and Lumbar Spinal Stenosisself-management and patient's adherence to plan as established by provider. Discussed plans with patient for ongoing care management follow up and provided patient with direct contact information for care management team Evaluation of current treatment plan related to Spinal Stenosis and patient's adherence to plan as established by provider Discussed plans with patient for ongoing care management follow up and provided patient with  direct contact information for care management team Screening for signs and symptoms of depression related to chronic disease state  Assessed social determinant of health barriers  Pain Interventions:  (Status:  Ongoing) Short Term Goal Pain assessment performed Medications reviewed Reviewed provider established plan for pain management Discussed importance of adherence to all scheduled medical appointments Counseled on the importance of reporting any/all new or changed pain symptoms or management strategies to pain management provider Advised patient to report to care team affect of pain on daily activities Discussed use of relaxation techniques and/or diversional activities to assist with pain reduction (distraction, imagery, relaxation, massage, acupressure, TENS, heat, and cold application Reviewed with patient prescribed pharmacological and nonpharmacological pain relief strategies Screening for signs and symptoms of depression related to chronic disease state  Assessed social determinant of health barriers  Patient Self-Care Activities:  Attend all scheduled provider appointments Call pharmacy for medication refills 3-7 days in advance of running out of medications Call provider office for new concerns or questions  Perform all self care activities independently  Perform IADL's (shopping, preparing meals, housekeeping, managing finances) independently Take medications as prescribed   Work with the social worker to address care coordination needs and will continue to work with the clinical team to address health care and disease management related needs  Plan:  The patient has been provided with contact information for the care management team and has been advised to call with any health related questions or concerns.  Next RN Case Management appointment scheduled for 04/29/24 at 1 pm.  Care plan and visit instructions communicated with the patient verbally today.  Patient agrees to receive a copy in MyChart. Active MyChart status and patient understanding of how to access instructions and care plan via MyChart confirmed with patient.     The patient has been provided with contact information for the care management team and has been advised to call with any health related questions or concerns.   Please call the care guide team at [phone] if you need to cancel or reschedule your appointment.   Please call 1-800-273-TALK (toll free, 24 hour hotline) if you are experiencing a Mental Health or Behavioral Health Crisis or need someone to talk to.  Bryan Valdez A. Gordy RN, BA, [facility], CRRN [place]  [facility] Population Health RN Care Manager Direct Dial: [phone]  Fax: [phone]

## 2024-04-01 ENCOUNTER — Encounter: Payer: Self-pay | Admitting: Physical Medicine & Rehabilitation

## 2024-04-01 ENCOUNTER — Encounter: Attending: Physical Medicine and Rehabilitation | Admitting: Physical Medicine & Rehabilitation

## 2024-04-01 DIAGNOSIS — M47816 Spondylosis without myelopathy or radiculopathy, lumbar region: Secondary | ICD-10-CM

## 2024-04-01 MED ORDER — LIDOCAINE HCL 1 % IJ SOLN
10.0000 mL | Freq: Once | INTRAMUSCULAR | Status: AC
  Administered 2024-04-01: 10 mL

## 2024-04-01 MED ORDER — LIDOCAINE HCL (PF) 2 % IJ SOLN
4.0000 mL | Freq: Once | INTRAMUSCULAR | Status: AC
  Administered 2024-04-01: 4 mL

## 2024-04-01 MED ORDER — IOHEXOL 180 MG/ML  SOLN
3.0000 mL | Freq: Once | INTRAMUSCULAR | Status: AC
  Administered 2024-04-01: 3 mL

## 2024-04-01 NOTE — Patient Instructions (Signed)
 Lumbar medial branch blocks were performed. This is to help diagnose the cause of the low back pain. It is important that you keep track of your pain for the first day or 2 after injection. This injection can give you temporary relief that lasts for hours or up to several months. There is no way to predict duration of pain relief.  Please try to compare your pain after injection to for the injection.  If this injection gives you  temporary relief there may be another longer-lasting procedure that may be beneficial call radiofrequency ablation   Please call [phone]

## 2024-04-01 NOTE — Progress Notes (Signed)
 Bilateral Lumbar L3, L4  medial branch blocks and L 5 dorsal ramus injection under fluoroscopic guidance  Indication: Lumbar pain which is not relieved by medication management or other conservative care and interfering with self-care and mobility.  Informed consent was obtained after describing risks and benefits of the procedure with the patient, this includes bleeding, infection, paralysis and medication side effects.  The patient wishes to proceed and has given written consent.  The patient was placed in prone position.  The lumbar area was marked and prepped with Betadine.  One mL of 1% lidocaine  was injected into each of 6 areas into the skin and subcutaneous tissue.  Then a 22-gauge 3.5in spinal needle was inserted targeting the junction of the left S1 superior articular process and sacral ala junction. Needle was advanced under fluoroscopic guidance.  Bone contact was made.Omnipaque  180 was injected x 0.5 mL demonstrating no intravascular uptake.  Then a solution  of 2% MPF lidocaine  was injected x 0.5 mL.  Then the left L5 superior articular process in transverse process junction was targeted.  Bone contact was made. Omnipaque  180 was injected x 0.5 mL demonstrating no intravascular uptake. Then a solution containing  2% MPF lidocaine  was injected x 0.5 mL.  Then the left L4 superior articular process in transverse process junction was targeted.  Bone contact was made. Omnipaque  180 was injected x 0.5 mL demonstrating no intravascular uptake.  Then a solution containing2% MPF lidocaine  was injected x 0.5 mL.  This same procedure was performed on the right side using the same needle, technique and injectate.  Patient tolerated procedure well.  Post procedure instructions were given. Patient to follow up with Dr Emeline 2/9 to discuss results, looking for 80% relief to justify 2nd set of MBB   Lidocaine  1% with preservative multidose, 10ml no waste Lidocaine  2% MPF, 5ml bottle, 3ml used 2ml  waste Omnipaque  180 1.5 ml used, 8.5 ml waste

## 2024-04-01 NOTE — Progress Notes (Signed)
" °  PROCEDURE RECORD [facility]   Name: HEZIKIAH RETZLOFF DOB:[birth year] MRN: [id number]  Date:04/01/2024  Physician: Prentice Compton, MD    Nurse/CMA: Jama, CMA  Allergies: Allergies[1]  Consent Signed: Yes.    Is patient diabetic? No.  CBG today? .  Pregnant: No. LMP: No LMP for male patient. (age 58-55)  Anticoagulants: no Anti-inflammatory: no Antibiotics: no  Procedure: Bilateral L3-4-5 Medial Branch Block  Position: Prone Start Time: 10:36 am  End Time: 10:47 am  Fluoro Time: 496  RN/CMA Jama, CMA Jamita Mckelvin, CMA    Time 10:14 am 10:59 am    BP 121/79 128/85    Pulse 63 63    Respirations 16 16    O2 Sat 98 98    S/S 6 6    Pain Level 7/10 3/10     D/C home with no one, patient A & O X 3, D/C instructions reviewed, and sits independently.           [1]  Allergies Allergen Reactions   Naproxen  Other (See Comments)    trigggers cluster migraines   "

## 2024-04-05 ENCOUNTER — Encounter: Admitting: Physical Medicine and Rehabilitation

## 2024-04-29 ENCOUNTER — Other Ambulatory Visit
